# Patient Record
Sex: Female | Born: 1955 | Race: Black or African American | Hispanic: No | Marital: Single | Smoking: Never smoker
Health system: Southern US, Community
[De-identification: ages and names within clinical notes are randomized; demographics above are authoritative.]

## PROBLEM LIST (undated history)

## (undated) DIAGNOSIS — E079 Disorder of thyroid, unspecified: Secondary | ICD-10-CM

## (undated) DIAGNOSIS — R32 Unspecified urinary incontinence: Secondary | ICD-10-CM

## (undated) DIAGNOSIS — I10 Essential (primary) hypertension: Secondary | ICD-10-CM

## (undated) DIAGNOSIS — H409 Unspecified glaucoma: Secondary | ICD-10-CM

## (undated) DIAGNOSIS — M1990 Unspecified osteoarthritis, unspecified site: Secondary | ICD-10-CM

## (undated) DIAGNOSIS — F32A Depression, unspecified: Secondary | ICD-10-CM

## (undated) DIAGNOSIS — T7840XA Allergy, unspecified, initial encounter: Secondary | ICD-10-CM

## (undated) DIAGNOSIS — G4730 Sleep apnea, unspecified: Secondary | ICD-10-CM

## (undated) DIAGNOSIS — E039 Hypothyroidism, unspecified: Secondary | ICD-10-CM

## (undated) DIAGNOSIS — G4733 Obstructive sleep apnea (adult) (pediatric): Secondary | ICD-10-CM

## (undated) DIAGNOSIS — H269 Unspecified cataract: Secondary | ICD-10-CM

## (undated) DIAGNOSIS — F329 Major depressive disorder, single episode, unspecified: Secondary | ICD-10-CM

## (undated) DIAGNOSIS — K219 Gastro-esophageal reflux disease without esophagitis: Secondary | ICD-10-CM

## (undated) HISTORY — PX: REDUCTION MAMMAPLASTY: SUR839

## (undated) HISTORY — DX: Unspecified cataract: H26.9

## (undated) HISTORY — DX: Morbid (severe) obesity due to excess calories: E66.01

## (undated) HISTORY — PX: OTHER SURGICAL HISTORY: SHX169

## (undated) HISTORY — PX: BREAST REDUCTION SURGERY: SHX8

## (undated) HISTORY — DX: Obstructive sleep apnea (adult) (pediatric): G47.33

## (undated) HISTORY — DX: Depression, unspecified: F32.A

## (undated) HISTORY — DX: Sleep apnea, unspecified: G47.30

## (undated) HISTORY — DX: Gastro-esophageal reflux disease without esophagitis: K21.9

## (undated) HISTORY — DX: Disorder of thyroid, unspecified: E07.9

## (undated) HISTORY — DX: Major depressive disorder, single episode, unspecified: F32.9

## (undated) HISTORY — DX: Allergy, unspecified, initial encounter: T78.40XA

## (undated) HISTORY — PX: CATARACT EXTRACTION: SUR2

## (undated) HISTORY — PX: COLONOSCOPY: SHX174

## (undated) HISTORY — DX: Essential (primary) hypertension: I10

## (undated) HISTORY — DX: Unspecified glaucoma: H40.9

---

## 1997-11-22 ENCOUNTER — Emergency Department (HOSPITAL_COMMUNITY): Admission: EM | Admit: 1997-11-22 | Discharge: 1997-11-22 | Payer: Self-pay | Admitting: Emergency Medicine

## 1999-03-04 ENCOUNTER — Encounter: Admission: RE | Admit: 1999-03-04 | Discharge: 1999-06-02 | Payer: Self-pay | Admitting: Family Medicine

## 1999-03-30 ENCOUNTER — Encounter: Payer: Self-pay | Admitting: Emergency Medicine

## 1999-03-30 ENCOUNTER — Emergency Department (HOSPITAL_COMMUNITY): Admission: EM | Admit: 1999-03-30 | Discharge: 1999-03-30 | Payer: Self-pay | Admitting: Emergency Medicine

## 1999-05-26 ENCOUNTER — Encounter: Payer: Self-pay | Admitting: Emergency Medicine

## 1999-07-31 ENCOUNTER — Encounter: Admission: RE | Admit: 1999-07-31 | Discharge: 1999-10-29 | Payer: Self-pay | Admitting: Family Medicine

## 1999-08-19 ENCOUNTER — Encounter: Admission: RE | Admit: 1999-08-19 | Discharge: 1999-08-19 | Payer: Self-pay | Admitting: Family Medicine

## 1999-08-19 ENCOUNTER — Encounter: Payer: Self-pay | Admitting: Family Medicine

## 2000-01-01 ENCOUNTER — Encounter: Admission: RE | Admit: 2000-01-01 | Discharge: 2000-03-31 | Payer: Self-pay | Admitting: Family Medicine

## 2000-02-02 ENCOUNTER — Ambulatory Visit (HOSPITAL_COMMUNITY): Admission: RE | Admit: 2000-02-02 | Discharge: 2000-02-02 | Payer: Self-pay | Admitting: Internal Medicine

## 2000-02-02 ENCOUNTER — Encounter: Payer: Self-pay | Admitting: Internal Medicine

## 2000-02-09 ENCOUNTER — Other Ambulatory Visit: Admission: RE | Admit: 2000-02-09 | Discharge: 2000-02-09 | Payer: Self-pay | Admitting: Gynecology

## 2000-02-16 ENCOUNTER — Encounter: Admission: RE | Admit: 2000-02-16 | Discharge: 2000-02-16 | Payer: Self-pay | Admitting: Gynecology

## 2000-02-16 ENCOUNTER — Encounter: Payer: Self-pay | Admitting: Gynecology

## 2001-02-14 ENCOUNTER — Other Ambulatory Visit: Admission: RE | Admit: 2001-02-14 | Discharge: 2001-02-14 | Payer: Self-pay | Admitting: Gynecology

## 2001-02-17 ENCOUNTER — Encounter: Admission: RE | Admit: 2001-02-17 | Discharge: 2001-02-17 | Payer: Self-pay | Admitting: Gynecology

## 2001-02-17 ENCOUNTER — Encounter: Payer: Self-pay | Admitting: Gynecology

## 2001-05-01 ENCOUNTER — Ambulatory Visit (HOSPITAL_BASED_OUTPATIENT_CLINIC_OR_DEPARTMENT_OTHER): Admission: RE | Admit: 2001-05-01 | Discharge: 2001-05-01 | Payer: Self-pay | Admitting: Critical Care Medicine

## 2001-08-21 ENCOUNTER — Ambulatory Visit (HOSPITAL_BASED_OUTPATIENT_CLINIC_OR_DEPARTMENT_OTHER): Admission: RE | Admit: 2001-08-21 | Discharge: 2001-08-21 | Payer: Self-pay | Admitting: Critical Care Medicine

## 2002-02-15 ENCOUNTER — Other Ambulatory Visit: Admission: RE | Admit: 2002-02-15 | Discharge: 2002-02-15 | Payer: Self-pay | Admitting: Gynecology

## 2002-02-24 ENCOUNTER — Encounter: Payer: Self-pay | Admitting: Gynecology

## 2002-02-24 ENCOUNTER — Encounter: Admission: RE | Admit: 2002-02-24 | Discharge: 2002-02-24 | Payer: Self-pay | Admitting: Gynecology

## 2002-09-19 ENCOUNTER — Encounter: Payer: Self-pay | Admitting: Endocrinology

## 2002-09-19 ENCOUNTER — Ambulatory Visit (HOSPITAL_COMMUNITY): Admission: RE | Admit: 2002-09-19 | Discharge: 2002-09-19 | Payer: Self-pay | Admitting: Endocrinology

## 2002-09-29 ENCOUNTER — Ambulatory Visit (HOSPITAL_COMMUNITY): Admission: RE | Admit: 2002-09-29 | Discharge: 2002-09-29 | Payer: Self-pay | Admitting: Endocrinology

## 2002-09-29 ENCOUNTER — Encounter: Payer: Self-pay | Admitting: Endocrinology

## 2003-02-20 ENCOUNTER — Other Ambulatory Visit: Admission: RE | Admit: 2003-02-20 | Discharge: 2003-02-20 | Payer: Self-pay | Admitting: Gynecology

## 2003-03-14 ENCOUNTER — Encounter: Admission: RE | Admit: 2003-03-14 | Discharge: 2003-03-14 | Payer: Self-pay | Admitting: Gynecology

## 2004-02-25 ENCOUNTER — Other Ambulatory Visit: Admission: RE | Admit: 2004-02-25 | Discharge: 2004-02-25 | Payer: Self-pay | Admitting: Gynecology

## 2004-03-13 ENCOUNTER — Encounter: Admission: RE | Admit: 2004-03-13 | Discharge: 2004-03-13 | Payer: Self-pay | Admitting: Internal Medicine

## 2004-03-25 ENCOUNTER — Ambulatory Visit: Payer: Self-pay | Admitting: Internal Medicine

## 2004-05-21 ENCOUNTER — Ambulatory Visit: Payer: Self-pay | Admitting: Internal Medicine

## 2004-06-03 ENCOUNTER — Ambulatory Visit: Payer: Self-pay | Admitting: Internal Medicine

## 2004-06-06 ENCOUNTER — Ambulatory Visit: Payer: Self-pay | Admitting: Internal Medicine

## 2004-08-25 ENCOUNTER — Ambulatory Visit: Payer: Self-pay | Admitting: Internal Medicine

## 2004-08-28 ENCOUNTER — Ambulatory Visit: Payer: Self-pay | Admitting: Internal Medicine

## 2004-08-28 ENCOUNTER — Encounter: Admission: RE | Admit: 2004-08-28 | Discharge: 2004-08-28 | Payer: Self-pay | Admitting: Internal Medicine

## 2004-09-05 ENCOUNTER — Ambulatory Visit (HOSPITAL_COMMUNITY): Admission: RE | Admit: 2004-09-05 | Discharge: 2004-09-05 | Payer: Self-pay | Admitting: Internal Medicine

## 2004-09-09 ENCOUNTER — Ambulatory Visit: Payer: Self-pay | Admitting: Internal Medicine

## 2004-12-03 ENCOUNTER — Ambulatory Visit: Payer: Self-pay | Admitting: Internal Medicine

## 2005-02-06 ENCOUNTER — Ambulatory Visit: Payer: Self-pay | Admitting: Internal Medicine

## 2005-03-02 ENCOUNTER — Other Ambulatory Visit: Admission: RE | Admit: 2005-03-02 | Discharge: 2005-03-02 | Payer: Self-pay | Admitting: Gynecology

## 2005-03-16 ENCOUNTER — Ambulatory Visit (HOSPITAL_COMMUNITY): Admission: RE | Admit: 2005-03-16 | Discharge: 2005-03-16 | Payer: Self-pay | Admitting: Internal Medicine

## 2005-04-20 ENCOUNTER — Ambulatory Visit: Payer: Self-pay | Admitting: Critical Care Medicine

## 2005-05-04 ENCOUNTER — Ambulatory Visit: Payer: Self-pay | Admitting: Internal Medicine

## 2005-06-19 ENCOUNTER — Ambulatory Visit: Payer: Self-pay | Admitting: Gastroenterology

## 2005-07-03 ENCOUNTER — Ambulatory Visit: Payer: Self-pay | Admitting: Gastroenterology

## 2005-08-07 ENCOUNTER — Ambulatory Visit: Payer: Self-pay | Admitting: Internal Medicine

## 2005-08-18 ENCOUNTER — Ambulatory Visit: Payer: Self-pay | Admitting: Internal Medicine

## 2005-08-20 ENCOUNTER — Ambulatory Visit: Payer: Self-pay | Admitting: Cardiology

## 2005-09-23 ENCOUNTER — Ambulatory Visit: Payer: Self-pay | Admitting: Internal Medicine

## 2005-10-19 ENCOUNTER — Ambulatory Visit: Payer: Self-pay | Admitting: Internal Medicine

## 2005-11-09 ENCOUNTER — Ambulatory Visit: Payer: Self-pay | Admitting: Internal Medicine

## 2006-02-01 ENCOUNTER — Encounter (INDEPENDENT_AMBULATORY_CARE_PROVIDER_SITE_OTHER): Payer: Self-pay | Admitting: *Deleted

## 2006-02-02 ENCOUNTER — Inpatient Hospital Stay (HOSPITAL_COMMUNITY): Admission: RE | Admit: 2006-02-02 | Discharge: 2006-02-03 | Payer: Self-pay | Admitting: Gynecology

## 2006-02-24 ENCOUNTER — Ambulatory Visit: Payer: Self-pay | Admitting: Critical Care Medicine

## 2006-03-17 ENCOUNTER — Encounter: Admission: RE | Admit: 2006-03-17 | Discharge: 2006-03-17 | Payer: Self-pay | Admitting: Gynecology

## 2006-05-03 ENCOUNTER — Ambulatory Visit: Payer: Self-pay | Admitting: Internal Medicine

## 2006-05-03 ENCOUNTER — Other Ambulatory Visit: Admission: RE | Admit: 2006-05-03 | Discharge: 2006-05-03 | Payer: Self-pay | Admitting: Gynecology

## 2006-08-24 ENCOUNTER — Ambulatory Visit: Payer: Self-pay | Admitting: Internal Medicine

## 2007-04-04 ENCOUNTER — Encounter: Admission: RE | Admit: 2007-04-04 | Discharge: 2007-04-04 | Payer: Self-pay | Admitting: Internal Medicine

## 2007-05-02 DIAGNOSIS — D250 Submucous leiomyoma of uterus: Secondary | ICD-10-CM | POA: Insufficient documentation

## 2007-05-02 DIAGNOSIS — E042 Nontoxic multinodular goiter: Secondary | ICD-10-CM | POA: Insufficient documentation

## 2007-05-02 DIAGNOSIS — R1310 Dysphagia, unspecified: Secondary | ICD-10-CM | POA: Insufficient documentation

## 2007-05-03 ENCOUNTER — Ambulatory Visit: Payer: Self-pay | Admitting: Critical Care Medicine

## 2007-05-03 ENCOUNTER — Ambulatory Visit: Payer: Self-pay | Admitting: Internal Medicine

## 2007-05-03 DIAGNOSIS — F32A Depression, unspecified: Secondary | ICD-10-CM | POA: Insufficient documentation

## 2007-05-03 DIAGNOSIS — F329 Major depressive disorder, single episode, unspecified: Secondary | ICD-10-CM

## 2007-05-03 DIAGNOSIS — K219 Gastro-esophageal reflux disease without esophagitis: Secondary | ICD-10-CM | POA: Insufficient documentation

## 2007-05-03 DIAGNOSIS — G4733 Obstructive sleep apnea (adult) (pediatric): Secondary | ICD-10-CM | POA: Insufficient documentation

## 2007-05-03 DIAGNOSIS — I10 Essential (primary) hypertension: Secondary | ICD-10-CM | POA: Insufficient documentation

## 2007-05-03 DIAGNOSIS — Z9989 Dependence on other enabling machines and devices: Secondary | ICD-10-CM

## 2007-05-03 DIAGNOSIS — J309 Allergic rhinitis, unspecified: Secondary | ICD-10-CM | POA: Insufficient documentation

## 2007-05-03 LAB — CONVERTED CEMR LAB
ALT: 20 units/L (ref 0–35)
AST: 18 units/L (ref 0–37)
Albumin: 3.7 g/dL (ref 3.5–5.2)
Alkaline Phosphatase: 76 units/L (ref 39–117)
BUN: 4 mg/dL — ABNORMAL LOW (ref 6–23)
Basophils Absolute: 0 10*3/uL (ref 0.0–0.1)
Basophils Relative: 0.3 % (ref 0.0–1.0)
Bilirubin Urine: NEGATIVE
Bilirubin, Direct: 0.2 mg/dL (ref 0.0–0.3)
CO2: 30 meq/L (ref 19–32)
Calcium: 9.4 mg/dL (ref 8.4–10.5)
Chloride: 105 meq/L (ref 96–112)
Cholesterol: 191 mg/dL (ref 0–200)
Creatinine, Ser: 0.8 mg/dL (ref 0.4–1.2)
Crystals: NEGATIVE
Eosinophils Absolute: 0.3 10*3/uL (ref 0.0–0.6)
Eosinophils Relative: 4.4 % (ref 0.0–5.0)
GFR calc Af Amer: 97 mL/min
GFR calc non Af Amer: 80 mL/min
Glucose, Bld: 99 mg/dL (ref 70–99)
HCT: 35.3 % — ABNORMAL LOW (ref 36.0–46.0)
HDL: 37.6 mg/dL — ABNORMAL LOW (ref >39.0)
Hemoglobin: 11.9 g/dL — ABNORMAL LOW (ref 12.0–15.0)
Ketones, ur: NEGATIVE mg/dL
LDL Cholesterol: 132 mg/dL — ABNORMAL HIGH (ref 0–99)
Leukocytes, UA: NEGATIVE
Lymphocytes Relative: 32.6 % (ref 12.0–46.0)
MCHC: 33.7 g/dL (ref 30.0–36.0)
MCV: 91.8 fL (ref 78.0–100.0)
Monocytes Absolute: 0.4 10*3/uL (ref 0.2–0.7)
Monocytes Relative: 7 % (ref 3.0–11.0)
Mucus, UA: NEGATIVE
Neutro Abs: 3.1 10*3/uL (ref 1.4–7.7)
Neutrophils Relative %: 55.7 % (ref 43.0–77.0)
Nitrite: NEGATIVE
Platelets: 271 10*3/uL (ref 150–400)
Potassium: 3.8 meq/L (ref 3.5–5.1)
RBC: 3.85 M/uL — ABNORMAL LOW (ref 3.87–5.11)
RDW: 12.7 % (ref 11.5–14.6)
Sodium: 143 meq/L (ref 135–145)
Specific Gravity, Urine: >=1.03 (ref 1.000–1.03)
TSH: 0.15 microintl units/mL — ABNORMAL LOW (ref 0.35–5.50)
Total Bilirubin: 0.8 mg/dL (ref 0.3–1.2)
Total CHOL/HDL Ratio: 5.1
Total Protein, Urine: NEGATIVE mg/dL
Total Protein: 7 g/dL (ref 6.0–8.3)
Triglycerides: 106 mg/dL (ref 0–149)
Urine Glucose: NEGATIVE mg/dL
Urobilinogen, UA: 0.2 (ref 0.0–1.0)
VLDL: 21 mg/dL (ref 0–40)
WBC: 5.7 10*3/uL (ref 4.5–10.5)
pH: 5.5 (ref 5.0–8.0)

## 2007-05-10 ENCOUNTER — Ambulatory Visit: Payer: Self-pay | Admitting: Internal Medicine

## 2007-05-10 ENCOUNTER — Other Ambulatory Visit: Admission: RE | Admit: 2007-05-10 | Discharge: 2007-05-10 | Payer: Self-pay | Admitting: Gynecology

## 2007-05-10 DIAGNOSIS — E039 Hypothyroidism, unspecified: Secondary | ICD-10-CM | POA: Insufficient documentation

## 2007-09-07 ENCOUNTER — Ambulatory Visit: Payer: Self-pay | Admitting: Internal Medicine

## 2007-09-07 DIAGNOSIS — R21 Rash and other nonspecific skin eruption: Secondary | ICD-10-CM | POA: Insufficient documentation

## 2007-09-08 ENCOUNTER — Encounter: Payer: Self-pay | Admitting: Internal Medicine

## 2007-09-08 LAB — CONVERTED CEMR LAB
BUN: 9 mg/dL (ref 6–23)
CO2: 31 meq/L (ref 19–32)
Calcium: 9.5 mg/dL (ref 8.4–10.5)
Chloride: 110 meq/L (ref 96–112)
Creatinine, Ser: 0.9 mg/dL (ref 0.4–1.2)
GFR calc Af Amer: 85 mL/min
GFR calc non Af Amer: 70 mL/min
Glucose, Bld: 111 mg/dL — ABNORMAL HIGH (ref 70–99)
Potassium: 4 meq/L (ref 3.5–5.1)
Sodium: 145 meq/L (ref 135–145)
TSH: 0.26 microintl units/mL — ABNORMAL LOW (ref 0.35–5.50)

## 2007-09-12 ENCOUNTER — Ambulatory Visit: Payer: Self-pay | Admitting: Internal Medicine

## 2007-09-21 ENCOUNTER — Telehealth: Payer: Self-pay | Admitting: Internal Medicine

## 2007-09-23 ENCOUNTER — Ambulatory Visit: Payer: Self-pay | Admitting: Internal Medicine

## 2007-09-29 LAB — CONVERTED CEMR LAB
BUN: 5 mg/dL — ABNORMAL LOW (ref 6–23)
CO2: 31 meq/L (ref 19–32)
Calcium: 9.1 mg/dL (ref 8.4–10.5)
Chloride: 113 meq/L — ABNORMAL HIGH (ref 96–112)
Creatinine, Ser: 0.8 mg/dL (ref 0.4–1.2)
GFR calc Af Amer: 97 mL/min
GFR calc non Af Amer: 80 mL/min
Glucose, Bld: 92 mg/dL (ref 70–99)
Potassium: 4.3 meq/L (ref 3.5–5.1)
Sodium: 147 meq/L — ABNORMAL HIGH (ref 135–145)
TSH: 0.48 microintl units/mL (ref 0.35–5.50)

## 2007-10-20 ENCOUNTER — Telehealth: Payer: Self-pay | Admitting: Internal Medicine

## 2007-12-29 ENCOUNTER — Ambulatory Visit: Payer: Self-pay | Admitting: Internal Medicine

## 2008-02-29 ENCOUNTER — Telehealth: Payer: Self-pay | Admitting: Internal Medicine

## 2008-03-29 ENCOUNTER — Ambulatory Visit: Payer: Self-pay | Admitting: Critical Care Medicine

## 2008-04-04 ENCOUNTER — Ambulatory Visit (HOSPITAL_BASED_OUTPATIENT_CLINIC_OR_DEPARTMENT_OTHER): Admission: RE | Admit: 2008-04-04 | Discharge: 2008-04-04 | Payer: Self-pay | Admitting: Internal Medicine

## 2008-04-25 ENCOUNTER — Encounter: Payer: Self-pay | Admitting: Pulmonary Disease

## 2008-04-25 ENCOUNTER — Ambulatory Visit (HOSPITAL_BASED_OUTPATIENT_CLINIC_OR_DEPARTMENT_OTHER): Admission: RE | Admit: 2008-04-25 | Discharge: 2008-04-25 | Payer: Self-pay | Admitting: Critical Care Medicine

## 2008-04-26 ENCOUNTER — Ambulatory Visit: Payer: Self-pay | Admitting: Internal Medicine

## 2008-04-26 LAB — CONVERTED CEMR LAB
BUN: 9 mg/dL (ref 6–23)
CO2: 31 meq/L (ref 19–32)
Calcium: 9.8 mg/dL (ref 8.4–10.5)
Chloride: 106 meq/L (ref 96–112)
Creatinine, Ser: 0.8 mg/dL (ref 0.4–1.2)
GFR calc Af Amer: 96 mL/min
GFR calc non Af Amer: 80 mL/min
Glucose, Bld: 93 mg/dL (ref 70–99)
Potassium: 3.6 meq/L (ref 3.5–5.1)
Sodium: 144 meq/L (ref 135–145)
TSH: 4.12 microintl units/mL (ref 0.35–5.50)

## 2008-05-01 ENCOUNTER — Ambulatory Visit: Payer: Self-pay | Admitting: Internal Medicine

## 2008-05-02 ENCOUNTER — Ambulatory Visit: Payer: Self-pay | Admitting: Pulmonary Disease

## 2008-05-02 ENCOUNTER — Telehealth: Payer: Self-pay | Admitting: Internal Medicine

## 2008-05-06 ENCOUNTER — Encounter: Payer: Self-pay | Admitting: Critical Care Medicine

## 2008-05-06 ENCOUNTER — Telehealth: Payer: Self-pay | Admitting: Critical Care Medicine

## 2008-05-07 ENCOUNTER — Telehealth (INDEPENDENT_AMBULATORY_CARE_PROVIDER_SITE_OTHER): Payer: Self-pay | Admitting: *Deleted

## 2008-06-08 ENCOUNTER — Ambulatory Visit: Payer: Self-pay | Admitting: Critical Care Medicine

## 2008-06-25 ENCOUNTER — Encounter: Payer: Self-pay | Admitting: Critical Care Medicine

## 2008-06-27 ENCOUNTER — Ambulatory Visit: Payer: Self-pay | Admitting: Pulmonary Disease

## 2008-06-28 ENCOUNTER — Encounter: Payer: Self-pay | Admitting: Pulmonary Disease

## 2008-08-09 ENCOUNTER — Telehealth: Payer: Self-pay | Admitting: Pulmonary Disease

## 2008-08-13 ENCOUNTER — Telehealth: Payer: Self-pay | Admitting: Internal Medicine

## 2008-08-16 HISTORY — PX: ANKLE SURGERY: SHX546

## 2008-08-23 ENCOUNTER — Ambulatory Visit: Payer: Self-pay | Admitting: Internal Medicine

## 2008-08-23 LAB — CONVERTED CEMR LAB
BUN: 11 mg/dL (ref 6–23)
CO2: 28 meq/L (ref 19–32)
Calcium: 9.4 mg/dL (ref 8.4–10.5)
Chloride: 106 meq/L (ref 96–112)
Creatinine, Ser: 0.8 mg/dL (ref 0.4–1.2)
GFR calc non Af Amer: 96.26 mL/min (ref >60)
Glucose, Bld: 77 mg/dL (ref 70–99)
Potassium: 4.3 meq/L (ref 3.5–5.1)
Sodium: 145 meq/L (ref 135–145)
TSH: 4.91 microintl units/mL (ref 0.35–5.50)

## 2008-08-27 LAB — CONVERTED CEMR LAB: Vit D, 25-Hydroxy: 17 ng/mL — ABNORMAL LOW (ref 30–89)

## 2008-08-28 ENCOUNTER — Ambulatory Visit: Payer: Self-pay | Admitting: Internal Medicine

## 2008-08-28 DIAGNOSIS — M25569 Pain in unspecified knee: Secondary | ICD-10-CM | POA: Insufficient documentation

## 2008-08-28 DIAGNOSIS — E559 Vitamin D deficiency, unspecified: Secondary | ICD-10-CM | POA: Insufficient documentation

## 2008-11-22 ENCOUNTER — Ambulatory Visit: Payer: Self-pay | Admitting: Internal Medicine

## 2008-11-23 LAB — CONVERTED CEMR LAB
BUN: 12 mg/dL (ref 6–23)
CO2: 29 meq/L (ref 19–32)
Calcium: 9.3 mg/dL (ref 8.4–10.5)
Chloride: 107 meq/L (ref 96–112)
Creatinine, Ser: 0.8 mg/dL (ref 0.4–1.2)
GFR calc non Af Amer: 96.17 mL/min (ref >60)
Glucose, Bld: 88 mg/dL (ref 70–99)
Potassium: 3.5 meq/L (ref 3.5–5.1)
Sodium: 146 meq/L — ABNORMAL HIGH (ref 135–145)
TSH: 7.81 microintl units/mL — ABNORMAL HIGH (ref 0.35–5.50)

## 2008-11-26 ENCOUNTER — Ambulatory Visit: Payer: Self-pay | Admitting: Internal Medicine

## 2008-11-29 ENCOUNTER — Telehealth: Payer: Self-pay | Admitting: Internal Medicine

## 2009-02-22 ENCOUNTER — Ambulatory Visit: Payer: Self-pay | Admitting: Internal Medicine

## 2009-02-22 LAB — CONVERTED CEMR LAB
BUN: 11 mg/dL (ref 6–23)
CO2: 29 meq/L (ref 19–32)
Calcium: 9.7 mg/dL (ref 8.4–10.5)
Chloride: 103 meq/L (ref 96–112)
Creatinine, Ser: 0.9 mg/dL (ref 0.4–1.2)
GFR calc non Af Amer: 83.87 mL/min (ref >60)
Glucose, Bld: 89 mg/dL (ref 70–99)
Potassium: 3.8 meq/L (ref 3.5–5.1)
Sodium: 145 meq/L (ref 135–145)
TSH: 0.98 microintl units/mL (ref 0.35–5.50)

## 2009-03-01 ENCOUNTER — Ambulatory Visit: Payer: Self-pay | Admitting: Internal Medicine

## 2009-03-01 DIAGNOSIS — K439 Ventral hernia without obstruction or gangrene: Secondary | ICD-10-CM | POA: Insufficient documentation

## 2009-03-13 ENCOUNTER — Telehealth: Payer: Self-pay | Admitting: Internal Medicine

## 2009-03-28 ENCOUNTER — Encounter: Payer: Self-pay | Admitting: Internal Medicine

## 2009-04-05 ENCOUNTER — Ambulatory Visit: Payer: Self-pay | Admitting: Diagnostic Radiology

## 2009-04-05 ENCOUNTER — Ambulatory Visit (HOSPITAL_BASED_OUTPATIENT_CLINIC_OR_DEPARTMENT_OTHER): Admission: RE | Admit: 2009-04-05 | Discharge: 2009-04-05 | Payer: Self-pay | Admitting: Gynecology

## 2009-04-24 ENCOUNTER — Ambulatory Visit: Payer: Self-pay | Admitting: Pulmonary Disease

## 2009-05-03 ENCOUNTER — Ambulatory Visit: Payer: Self-pay | Admitting: Internal Medicine

## 2009-05-15 ENCOUNTER — Encounter: Payer: Self-pay | Admitting: Internal Medicine

## 2009-07-15 ENCOUNTER — Telehealth (INDEPENDENT_AMBULATORY_CARE_PROVIDER_SITE_OTHER): Payer: Self-pay | Admitting: *Deleted

## 2009-07-16 ENCOUNTER — Telehealth: Payer: Self-pay | Admitting: Internal Medicine

## 2009-07-16 ENCOUNTER — Encounter: Payer: Self-pay | Admitting: Internal Medicine

## 2009-07-22 ENCOUNTER — Encounter: Payer: Self-pay | Admitting: Internal Medicine

## 2010-01-03 ENCOUNTER — Encounter: Payer: Self-pay | Admitting: Pulmonary Disease

## 2010-04-24 ENCOUNTER — Ambulatory Visit: Payer: Self-pay | Admitting: Internal Medicine

## 2010-04-24 LAB — CONVERTED CEMR LAB: Blood Glucose, Fingerstick: 97

## 2010-04-29 LAB — CONVERTED CEMR LAB
Bilirubin Urine: NEGATIVE
Ketones, ur: NEGATIVE mg/dL
Leukocytes, UA: NEGATIVE
Nitrite: NEGATIVE
Specific Gravity, Urine: >=1.03 (ref 1.000–1.030)
Total Protein, Urine: NEGATIVE mg/dL
Urine Glucose: NEGATIVE mg/dL
Urobilinogen, UA: 0.2 (ref 0.0–1.0)
pH: 5 (ref 5.0–8.0)

## 2010-05-16 ENCOUNTER — Ambulatory Visit (HOSPITAL_COMMUNITY)
Admission: RE | Admit: 2010-05-16 | Discharge: 2010-05-16 | Payer: Self-pay | Source: Home / Self Care | Attending: Gynecology | Admitting: Gynecology

## 2010-05-16 LAB — HM MAMMOGRAPHY: HM Mammogram: NEGATIVE

## 2010-06-17 NOTE — Assessment & Plan Note (Signed)
Summary: 3 MO ROV /NWS  $50   Vital Signs:  Patient profile:   55 year old female Weight:      305 pounds Temp:     97.3 degrees F oral Pulse rate:   61 / minute BP sitting:   164 / 102  (left arm)  Vitals Entered By: Tora Perches (March 01, 2009 8:08 AM) CC: f/u Is Patient Diabetic? No   Primary Care Provider:  Dr Posey Rea  CC:  f/u.  History of Present Illness: The patient presents for a follow up of hypertension, wt gain, hyperlipidemia   Preventive Screening-Counseling & Management  Alcohol-Tobacco     Smoking Status: never  Current Medications (verified): 1)  Doxazosin Mesylate 4 Mg  Tabs (Doxazosin Mesylate) .[name].. 1 By Mouth Two Times A Day 2)  Atenolol 100 Mg  Tabs (Atenolol) .[name].. 1 By Mouth Two Times A Day 3)  Furosemide 40 Mg  Tabs (Furosemide) .... Once Daily 4)  Klor-Con M20 20 Meq  Tbcr (Potassium Chloride Crys Cr) .... Once Daily 5)  Nasonex 50 Mcg/act  Susp (Mometasone Furoate) .... Two Puffs Each Nostril Daily 6)  Sertraline Hcl 100 Mg Tabs (Sertraline Hcl) .[name].. 1 By Mouth Once Daily 7)  Vitamin D3 1000 Unit  Tabs (Cholecalciferol) .[name].. 1 Qd 8)  Adult Aspirin Ec Low Strength 81 Mg  Tbec (Aspirin) .[name].. 1 By Mouth Daily 9)  B Complex-B12   Tabs (B Complex Vitamins) .[name].. 1 By Mouth Daily 10)  Fexofenadine Hcl 180 Mg Tabs (Fexofenadine Hcl) .[name].. 1 Once Daily As Needed Allergies 11)  Voltaren 1 %  Gel (Diclofenac Sodium) .... Two Times A Day  To Qid As Needed 12)  Furosemide 20 Mg Tabs (Furosemide) .... Take 1 Tab By Mouth Every Day 13)  Levothyroxine Sodium 175 Mcg  Tabs (Levothyroxine Sodium) .[name].. 1 By Mouth Daily 14)  Cpap .Marland KitchenMarland KitchenMarland Kitchen 15  Cmh2o  Allergies: 1)  ! Diovan (Valsartan) 2)  ! Lotrel (Amlodipine Besy-Benazepril Hcl)  Past History:  Past Medical History: Last updated: 05/10/2007 Sleep Apnea Depression G E R D Hypertension Allergic Rhinitis OSA on CPAP   Dr Delford Field   GYN Dr Nicholas Lose  Social History: Last updated: 05/01/2008 Patient never  smoked.  Occupation: lost her job in [employer] w/AE Single  Physical Exam  General:   in no acute distress obese.   Head:  normocephalic and atraumatic Nose:  no deformity, discharge, inflammation, or lesions Mouth:  no deformity or lesions    Neck:  no masses, thyromegaly, or abnormal cervical nodes Lungs:  clear bilaterally to auscultation and percussion Heart:  regular rate and rhythm, S1, S2 without murmurs, rubs, gallops, or clicks Abdomen:  S/NT Ventr hernia to the R and up from umbilicus Msk:  R ankle  Extremities:  No edema B Neurologic:  CN II-XII grossly intact with normal reflexes, coordination, muscle strength and tone Skin:  intact without lesions or rashes Psych:  alert and cooperative; normal mood and affect; normal attention span and concentration   Impression & Recommendations:  Problem # 1:  OBESITY, MORBID (ICD-278.01) Assessment Deteriorated See "Patient Instructions".   Problem # 2:  HYPOTHYROIDISM, UNSPECIFIED (ICD-244.9) Assessment: Improved  Her updated medication list for this problem includes:    Levothyroxine Sodium 175 Mcg Tabs (Levothyroxine sodium) .[name]... 1 by mouth daily  Problem # 3:  HYPERTENSION (ICD-401.9) Assessment: Deteriorated Loose wt. BP nl at home. The following medications were removed from the medication list:    Furosemide 20 Mg Tabs (Furosemide) .Marland KitchenMarland KitchenMarland KitchenMarland Kitchen  Take 1 tab by mouth every day Her updated medication list for this problem includes:    Doxazosin Mesylate 4 Mg Tabs (Doxazosin mesylate) .[name]... 1 by mouth two times a day    Atenolol 100 Mg Tabs (Atenolol) .[name]... 1 by mouth two times a day    Furosemide 40 Mg Tabs (Furosemide) ..... Once daily  Problem # 4:  DEPRESSION (ICD-311) Assessment: Unchanged  Her updated medication list for this problem includes:    Sertraline Hcl 100 Mg Tabs (Sertraline hcl) .[name]... 1 by mouth once daily  Problem # 5:  HERNIA, VENTRAL (ICD-553.20) Assessment: New Surg ref offered  Complete  Medication List: 1)  Doxazosin Mesylate 4 Mg Tabs (Doxazosin mesylate) .[name].. 1 by mouth two times a day 2)  Atenolol 100 Mg Tabs (Atenolol) .[name].. 1 by mouth two times a day 3)  Furosemide 40 Mg Tabs (Furosemide) .... Once daily 4)  Klor-con M20 20 Meq Tbcr (Potassium chloride crys cr) .... Once daily 5)  Nasonex 50 Mcg/act Susp (Mometasone furoate) .... Two puffs each nostril daily 6)  Sertraline Hcl 100 Mg Tabs (Sertraline hcl) .[name].. 1 by mouth once daily 7)  Vitamin D3 1000 Unit Tabs (Cholecalciferol) .[name].. 1 qd 8)  Adult Aspirin Ec Low Strength 81 Mg Tbec (Aspirin) .[name].. 1 by mouth daily 9)  B Complex-b12 Tabs (B complex vitamins) .[name].. 1 by mouth daily 10)  Fexofenadine Hcl 180 Mg Tabs (Fexofenadine hcl) .[name].. 1 once daily as needed allergies 11)  Voltaren 1 % Gel (Diclofenac sodium) .... Two times a day  to qid as needed 12)  Levothyroxine Sodium 175 Mcg Tabs (Levothyroxine sodium) .[name].. 1 by mouth daily 13)  Cpap  .Marland KitchenMarland KitchenMarland Kitchen 15  cmh2o  Patient Instructions: 1)  Use stretching exercises that I have provided (15 min. or longer every day) 2)  Try to eat more raw plant food, fresh and dry fruit, raw almonds, leafy vegetables, whole foods and less red meat, less animal fat. Poultry and fish is better for you than pork and beef. Avoid processed foods (canned soups, hot dogs, sausage, bacon , frozen dinners). Avoid corn syrup, high fructose syrup or aspartam and Splenda  containing drinks. Honey, Agave and Stevia are better sweeteners. Make your own  dressing with olive oil, wine vinegar, lemon juce, garlic etc. for your salads. 3)  Please schedule a follow-up appointment in 4 months. 4)  BMP prior to visit, ICD-9:272.0  995.20 5)  Hepatic Panel prior to visit, ICD-9: 6)  Lipid Panel prior to visit, ICD-9: 7)  TSH prior to visit, ICD-9: Prescriptions: LEVOTHYROXINE SODIUM 175 MCG  TABS (LEVOTHYROXINE SODIUM) 1 by mouth daily  #90 x 3   Entered and Authorized by:   Tresa Garter MD   Signed by:    Tresa Garter MD on 03/01/2009   Method used:   Electronically to        [facility]* (mail-order)             ,          Ph: [phone]       Fax: [phone]   RxID:   [id number] FEXOFENADINE HCL 180 MG TABS (FEXOFENADINE HCL) 1 once daily as needed allergies  #90 x 3   Entered and Authorized by:   Tresa Garter MD   Signed by:   Tresa Garter MD on 03/01/2009   Method used:   Electronically to        [facility]* (mail-order)             ,  Ph: [phone]       Fax: [phone]   RxID:   [id number] VITAMIN D3 1000 UNIT  TABS (CHOLECALCIFEROL) 1 qd  #90 x 3   Entered and Authorized by:   Tresa Garter MD   Signed by:   Tresa Garter MD on 03/01/2009   Method used:   Electronically to        [facility]* (mail-order)             ,          Ph: [phone]       Fax: [phone]   RxID:   [id number] SERTRALINE HCL 100 MG TABS (SERTRALINE HCL) 1 by mouth once daily  #90 x 3   Entered and Authorized by:   Tresa Garter MD   Signed by:   Tresa Garter MD on 03/01/2009   Method used:   Electronically to        [facility]* (mail-order)             ,          Ph: [phone]       Fax: [phone]   RxID:   [id number] KLOR-CON M20 20 MEQ  TBCR (POTASSIUM CHLORIDE CRYS CR) once daily  #90 x 3   Entered and Authorized by:   Tresa Garter MD   Signed by:   Tresa Garter MD on 03/01/2009   Method used:   Electronically to        [facility]* (mail-order)             ,          Ph: [phone]       Fax: [phone]   RxID:   [id number] FUROSEMIDE 40 MG  TABS (FUROSEMIDE) once daily  #90 x 3   Entered and Authorized by:   Tresa Garter MD   Signed by:   Tresa Garter MD on 03/01/2009   Method used:   Electronically to        [facility]* (mail-order)             ,          Ph: [phone]       Fax: [phone]   RxID:   [id number] ATENOLOL  100 MG  TABS (ATENOLOL) 1 by mouth two times a day  #180 x 3   Entered and Authorized by:   Tresa Garter MD   Signed by:   Tresa Garter MD on 03/01/2009   Method used:   Electronically to        [facility]* (mail-order)             ,          Ph: [phone]       Fax: [phone]   RxID:   [id number] DOXAZOSIN MESYLATE 4 MG  TABS (DOXAZOSIN MESYLATE) 1 by mouth two times a day  #180 x 3   Entered and Authorized by:   Tresa Garter MD   Signed by:   Tresa Garter MD on 03/01/2009   Method used:   Electronically to        [facility]* (mail-order)             ,          Ph: [phone]       Fax: [phone]   RxID:   [id number]

## 2010-06-17 NOTE — Letter (Signed)
Summary: Generic Letter  [facility]  [address] [place], [place] [address]   Phone: [phone]  Fax: [phone]    07/16/2009  [facility] [address] [place], [place]  [address]  Dear Ms. Nazar,   The above named patient was advised to remove the carpet from her home due to medical reasons.       Sincerely,   Lamar Sprinkles, CMA (AAMA) For Dr A. Plotnikov

## 2010-06-17 NOTE — Miscellaneous (Signed)
Summary: Orders Update  Clinical Lists Changes  Orders: Added new Referral order of DME Referral (DME) - Signed 

## 2010-06-17 NOTE — Progress Notes (Signed)
Summary: CPAP adjustment- pt returned call  ---- Converted from flag ---- ---- 05/06/2008 2:53 PM, Storm Frisk MD wrote: call the pt and tell her we will be adjusting cpap to 15cmh2o pressure. may have a leak on mask  will follow. pt needs another f/u ov soon after pressure is adjusted.  pw ------------------------------ LMTCB.  lc Phone Note Call from Patient   Caller: Patient Call For: wright Summary of Call: pt returned call. she has made an ov for 1/ 22 in hp office. she will rsc if needed after speaking to nurse re: cpap pressure.  Initial call taken by: Tivis Ringer,  May 08, 2008 12:05 PM  Follow-up for Phone Call        Noted. I did speak with pt to confirm f/u ov with PW.  She is to call if she has any problems with the new mask or on the new CPAP pressure. Follow-up by: Michel Bickers CMA,  May 08, 2008 5:15 PM

## 2010-06-17 NOTE — Assessment & Plan Note (Signed)
Summary: SLEEP CONSULT/RJC   Referred by:  Dr Shan Levans PCP:  Dr Posey Rea  Chief Complaint:  Sleep consult.  Pt has CPAP machine uses every night.  Pt got new CPAP x 2 weeks ago, doing better on new machine.  Last sleep study 04/25/08., and waking up gasping for air.  History of Present Illness: 55/F, obese with obstructive sleep apnea diagnosed in 4/03 on CPAP 16 cm. Weight gain of 58 lbs since study, rpt titration 12/09 showed adequate control of events with CPAP 15 cm, now on autoCPAP - awaiting download. Breakthrough gasping events have decreased-she describes one such event in church when awake. Feels presure is high sometimes when she has an arousal. Bedtime is 10 p, sleep latency 5-15 mins, wakes up 2-3 times without post void latency , oob at 8A, feels refreshed. Denies excessive daytime somnolence , Epworth Sleepiness Score 5/24.      This is a 55 year old woman who presents with waking up gasping for air.  The patient has no history of problem falling asleep, problem staying asleep, frequent night time awakenings, prolonged middle of the night awakening, early morning awakenings, excessive daytime sleepiness, frequent episodes of sudden loss of muscle tone, loud snoring, witnessed apneas, difficulty waking, non restorative sleep, shortness of breath, nighttime chest pain, waking up gasping for air, nighttime cough, sleep walking, night terrors, eating during sleep, restless legs syndrome, frequent leg jerks during sleep, acting out dreams, and recurrent nightmares.  There is no history of racing thoughts, going to bed not sleepy, variable wake times, feeling wired but tired, increased time in bed, watching TV in bed, reading in bed, working in bed, working up to bedtime, supervising sleep, daytime anxiety, nightime anxiety, sleep anxiety, depression, and chronic pain.  There is no history of behavioral problems, inattention, seizure disorder, unable to fall asleep alone, associations:, and  sleep walking:.  Prior steps taken have included CPAP.  Evaluation to date has included sleep study.      Updated Prior Medication List: DOXAZOSIN MESYLATE 4 MG  TABS (DOXAZOSIN MESYLATE) 1 by mouth two times a day ATENOLOL 100 MG  TABS (ATENOLOL) 1 by mouth two times a day FUROSEMIDE 40 MG  TABS (FUROSEMIDE) once daily KLOR-CON M20 20 MEQ  TBCR (POTASSIUM CHLORIDE CRYS CR) once daily LEVOXYL 137 MCG  TABS (LEVOTHYROXINE SODIUM) 1 by mouth qd NASONEX 50 MCG/ACT  SUSP (MOMETASONE FUROATE) Two puffs each nostril daily [BMN] SERTRALINE HCL 100 MG TABS (SERTRALINE HCL) 1 by mouth once daily VITAMIN D3 1000 UNIT  TABS (CHOLECALCIFEROL) 1 qd ADULT ASPIRIN EC LOW STRENGTH 81 MG  TBEC (ASPIRIN) 1 by mouth daily B COMPLEX-B12   TABS (B COMPLEX VITAMINS) 1 by mouth daily FEXOFENADINE HCL 180 MG TABS (FEXOFENADINE HCL) 1 once daily as needed allergies * CPAP 15  cmH2O  Current Allergies (reviewed today): ! DIOVAN (VALSARTAN) ! LOTREL (AMLODIPINE BESY-BENAZEPRIL HCL) Past Medical History:    Reviewed history from 05/10/2007 and no changes required:       Sleep Apnea       Depression       G E R D       Hypertension       Allergic Rhinitis       OSA on CPAP   Dr Delford Field                     GYN Dr Nicholas Lose  Family History:    Family History Hypertension    mother-breast CA  Mat grandmother-colon CA  Social History:    Reviewed history from 05/01/2008 and no changes required:       Patient never smoked.        Occupation: lost her job in [employer] w/AE       Single   Risk Factors: Tobacco use:  never Alcohol use:  no  Mammogram History:    Date of Last Mammogram:  04/04/2008  @  [facility]  Vital Signs:  Patient Profile:   55 Years Old Female Height:     66 inches Weight:      307 pounds O2 Sat:      96 % O2 treatment:    Room Air Temp:     97.8 degrees F oral Pulse rate:   63 / minute BP sitting:   138 / 80  (right arm) Cuff size:   large  Vitals Entered By: Cloyde Reams  RN (June 27, 2008 10:21 AM)             Comments Pt is here for sleep consult referred by Dr Delford Field. Medications reviewed Cloyde Reams RN  June 27, 2008 10:26 AM     Physical Exam  General:     well developed, well nourished, in no acute distress obese.   Head:     normocephalic and atraumatic Eyes:     PERRLA/EOM intact; conjunctiva and sclera clear Ears:     TMs intact and clear with normal canals Nose:     no deformity, discharge, inflammation, or lesions Mouth:     no deformity or lesions Melampatti Class II.   Neck:     no masses, thyromegaly, or abnormal cervical nodes Chest Wall:     no deformities noted Lungs:     clear bilaterally to auscultation and percussion Heart:     regular rate and rhythm, S1, S2 without murmurs, rubs, gallops, or clicks Abdomen:     bowel sounds positive; abdomen soft and non-tender without masses, or organomegaly Msk:     no deformity or scoliosis noted with normal posture Pulses:     pulses normal Extremities:     no clubbing, cyanosis, edema, or deformity noted Neurologic:     CN II-XII grossly intact with normal reflexes, coordination, muscle strength and tone Skin:     intact without lesions or rashes Cervical Nodes:     no significant adenopathy Axillary Nodes:     no significant adenopathy Psych:     alert and cooperative; normal mood and affect; normal attention span and concentration   Impression & Recommendations:  Problem # 1:  SLEEP APNEA (ICD-780.57) Assessment: Improved Improved with auto CPAP - await download beofre any more changes. Discussed ramp function, does not appear to have mask leak - tolerating nasal pillows well. The pathophysiology of obstructive sleep apnea, it's cardiovascular consequences and modes of treatment including CPAP were discussed with the patient in great detail.   Problem # 2:  OBESITY, MORBID (ICD-278.01) Has seen a nutritionist. To start exercise program in spring.   Other Orders: Est. Patient Level IV (40981)  Patient Instructions: 1)  Please schedule a follow-up appointment in 4 months. 2)  I will look at the [facility] & recommend changes if needed.  Appended Document: SLEEP CONSULT/RJC reviewed downlaod >> avg pressure 13 cm, study showed 15 cm, will leave on auto since symptomatically better.

## 2010-06-17 NOTE — Progress Notes (Signed)
Summary: ALLERGIES  Phone Note Call from Patient Call back at Home Phone [phone]   Summary of Call: Patient is requesting rx for allergies. Says that she thinks she was given allegra in the past.  Initial call taken by: Lamar Sprinkles,  October 20, 2007 1:07 PM  Follow-up for Phone Call        OK Allegra Follow-up by: Tresa Garter MD,  October 20, 2007 1:28 PM  Additional Follow-up for Phone Call Additional follow up Details #1::        Called patient [facility] med sent to pharm. Additional Follow-up by: Orlan Leavens,  October 20, 2007 2:16 PM    New/Updated Medications: FEXOFENADINE HCL 180 MG TABS (FEXOFENADINE HCL) 1 once daily as needed allergies   Prescriptions: FEXOFENADINE HCL 180 MG TABS (FEXOFENADINE HCL) 1 once daily as needed allergies  #30 x 6   Entered and Authorized by:   Tresa Garter MD   Signed by:   Orlan Leavens on 10/20/2007   Method used:   Electronically sent to ...       [facility] # [phone] [address]       [place], [place]  [address]       Ph: [phone]       Fax: [phone]   RxID:   [id number]

## 2010-06-17 NOTE — Letter (Signed)
Summary: CMN for CPAP Supplies/[facility]  CMN for CPAP Supplies/[facility]   Imported By: Sherian Rein 01/07/2010 08:52:41  _____________________________________________________________________  External Attachment:    Type:   Image     Comment:   External Document

## 2010-06-17 NOTE — Letter (Signed)
Summary: C W Lomax,MD  C W Lomax,MD   Imported By: Lester [place] 05/31/2009 08:18:02  _____________________________________________________________________  External Attachment:    Type:   Image     Comment:   External Document

## 2010-06-17 NOTE — Progress Notes (Signed)
Summary: Alcohol  Phone Note Call from Patient   Summary of Call: Pt wants to know if it is safe to drink wine with meds she is on.  Initial call taken by: Lamar Sprinkles,  May 02, 2008 1:11 PM  Follow-up for Phone Call        1 drink should be OK Follow-up by: Tresa Garter MD,  May 07, 2008 12:47 PM  Additional Follow-up for Phone Call Additional follow up Details #1::        called pt no answer left message to call office back  Additional Follow-up by: Windell Norfolk,  May 07, 2008 1:13 PM    Additional Follow-up for Phone Call Additional follow up Details #2::    pt called office back and made her aware of physcian instruction Follow-up by: Windell Norfolk,  May 07, 2008 1:17 PM

## 2010-06-17 NOTE — Assessment & Plan Note (Signed)
Summary: ROV/ MBW   Visit Type:  Follow-up Copy to:  Dr Shan Levans Primary Provider/Referring Provider:  Dr Posey Rea  CC:  Pt here for sleep follow up. Pt states is using CPAP machine every night approx 4 hours.  History of Present Illness: 52/F, obese for FU of obstructive sleep apnea diagnosed in 4/03 on autoCPAP. Weight gain of 58 lbs since study, rpt titration 12/09 showed adequate control of events with CPAP 15 cm, now on autoCPAP. Breakthrough gasping events have decreased-she describes one such event in church when awake. Feels presure is high sometimes when she has an arousal.  April 24, 2009 10:00 AM  reviewed downlaod  3/10 >> avg pressure 13 cm, study showed 15 cm >> ct autoCPAP Denies excessive daytime somnolence , Epworth Sleepiness Score 5/24. wt unchanged, no snoring.Mask ok pressure ok.  Current Medications (verified): 1)  Doxazosin Mesylate 4 Mg  Tabs (Doxazosin Mesylate) .[name].. 1 By Mouth Two Times A Day 2)  Atenolol 100 Mg  Tabs (Atenolol) .[name].. 1 By Mouth Two Times A Day 3)  Furosemide 40 Mg  Tabs (Furosemide) .... Once Daily 4)  Klor-Con M20 20 Meq  Tbcr (Potassium Chloride Crys Cr) .... Once Daily 5)  Nasonex 50 Mcg/act  Susp (Mometasone Furoate) .... Two Puffs Each Nostril Daily As Needed 6)  Sertraline Hcl 100 Mg Tabs (Sertraline Hcl) .[name].. 1 By Mouth Once Daily 7)  Vitamin D3 1000 Unit  Tabs (Cholecalciferol) .[name].. 1 Qd 8)  Adult Aspirin Ec Low Strength 81 Mg  Tbec (Aspirin) .[name].. 1 By Mouth Daily 9)  B Complex-B12   Tabs (B Complex Vitamins) .[name].. 1 By Mouth Daily 10)  Fexofenadine Hcl 180 Mg Tabs (Fexofenadine Hcl) .[name].. 1 Once Daily As Needed Allergies 11)  Voltaren 1 %  Gel (Diclofenac Sodium) .... Two Times A Day  To Qid As Needed 12)  Levothyroxine Sodium 175 Mcg  Tabs (Levothyroxine Sodium) .[name].. 1 By Mouth Daily 13)  Cpap .Marland KitchenMarland KitchenMarland Kitchen 15  Cmh2o  Allergies (verified): 1)  ! Diovan (Valsartan) 2)  ! Lotrel (Amlodipine Besy-Benazepril Hcl)  Past History:   Past Medical History: Last updated: 05/10/2007 Sleep Apnea Depression G E R D Hypertension Allergic Rhinitis OSA on CPAP   Dr Delford Field   GYN Dr Nicholas Lose  Social History: Last updated: 05/01/2008 Patient never smoked.  Occupation: lost her job in [employer] w/AE Single  Review of Systems  The patient denies anorexia, fever, weight loss, weight gain, vision loss, decreased hearing, hoarseness, chest pain, syncope, dyspnea on exertion, peripheral edema, prolonged cough, headaches, hemoptysis, abdominal pain, melena, hematochezia, severe indigestion/heartburn, hematuria, muscle weakness, difficulty walking, depression, unusual weight change, and abnormal bleeding.    Vital Signs:  Patient profile:   55 year old female Height:      66 inches Weight:      303.13 pounds O2 Sat:      96 % on Room air Temp:     97.7 degrees F oral Pulse rate:   60 / minute BP sitting:   112 / 78  (left arm) Cuff size:   large  Vitals Entered By: Zackery Barefoot CMA (April 24, 2009 9:43 AM)  O2 Flow:  Room air CC: Pt here for sleep follow up. Pt states is using CPAP machine every night approx 4 hours Comments Medications reviewed with patient Zackery Barefoot CMA  April 24, 2009 9:43 AM    Physical Exam  Additional Exam:  Gen. Pleasant, obese, in no distress ENT - no  lesions, no post nasal drip, class 2 airway Neck: No JVD, no thyromegaly, no carotid bruits Lungs: no use of accessory muscles, no dullness to percussion, clear without rales or rhonchi  Cardiovascular: Rhythm regular, heart sounds  normal, no murmurs or gallops, no peripheral edema Musculoskeletal: No deformities, no cyanosis or clubbing      Impression & Recommendations:  Problem # 1:  SLEEP APNEA (ICD-780.57)  ct autoCPAP , no changes . Compliance encouraged, wt loss emphasized, asked to avoid meds with sedative side effects, cautioned against driving when sleepy.   Orders: Est. Patient Level II (56387)   Medications Added to Medication List This Visit: 1)  Nasonex 50 Mcg/act Susp (Mometasone furoate) .... Two puffs each nostril daily as needed  Patient Instructions: 1)  Please schedule a follow-up appointment in 1 year. 2)  If you have new problems, please turn in the card so we can get  a download

## 2010-06-17 NOTE — Assessment & Plan Note (Signed)
Summary: Pulmonary OV   Chief Complaint:  f/u cpap - adjusting to new mask - pressure seems to much.  History of Present Illness:  This is a 55 year old, African-American female, here for her annual sleep apnea followup.      The patient comes in today for OSA follow-up.  The patient presents with dry nose and/or throat and epistaxis, but has no history of mask leak, skin irritation, pressure sores or abrasions, nasal congestion, rhinorrhea, cough, chest discomfort, aerophagia, pulling mask off in the middle of the night, waking up choking, and excessive daytime sleepiness.  There is no history of racing thoughts, going to bed not sleepy, feeling wired but tired, increased time in bed, watching TV in bed, reading in bed, daytime anxiety, and nightime anxiety.  The patient has been using CPAP as directed.  The percent usage of CPAP used 7 nights/week.  The following interventions have been reported as being effective: this is a 55 year old, white female with obstructive sleep apnea, here for her annual follow-up.heated humidification.  The patient reports no effect from chin strap.   11/12  No cough or dyspnea.  Is choking at night before put on cpap as lays flat.  Weight is up 20#. Some edema in feet.  No nasal congestion.  Using full face cpap maske.  Here for annual OSA f/u. Wakes up refreshed. No memory issues.  June 08, 2008 10:15 AM Had sleep study which showed good control with nasal mask and cpap 15cmh20.  Pt still having significant mask leak.  Feels too much pressure at this time. Feels like has no energy.  Still overweight     Updated Prior Medication List: DOXAZOSIN MESYLATE 4 MG  TABS (DOXAZOSIN MESYLATE) 1 by mouth two times a day ATENOLOL 100 MG  TABS (ATENOLOL) 1 by mouth two times a day FUROSEMIDE 40 MG  TABS (FUROSEMIDE) once daily KLOR-CON M20 20 MEQ  TBCR (POTASSIUM CHLORIDE CRYS CR) once daily LEVOXYL 137 MCG  TABS (LEVOTHYROXINE SODIUM) 1 by mouth qd NASONEX 50  MCG/ACT  SUSP (MOMETASONE FUROATE) Two puffs each nostril daily [BMN] SERTRALINE HCL 100 MG TABS (SERTRALINE HCL) 1 by mouth once daily VITAMIN D3 1000 UNIT  TABS (CHOLECALCIFEROL) 1 qd ADULT ASPIRIN EC LOW STRENGTH 81 MG  TBEC (ASPIRIN) 1 by mouth daily B COMPLEX-B12   TABS (B COMPLEX VITAMINS) 1 by mouth daily FEXOFENADINE HCL 180 MG TABS (FEXOFENADINE HCL) 1 once daily as needed allergies * CPAP 16 cmH2O  Current Allergies (reviewed today): ! DIOVAN (VALSARTAN) ! LOTREL (AMLODIPINE BESY-BENAZEPRIL HCL)  Past Medical History:    Reviewed history from 05/10/2007 and no changes required:       Sleep Apnea       Depression       G E R D       Hypertension       Allergic Rhinitis       OSA on CPAP   Dr Delford Field                     GYN Dr Nicholas Lose     Review of Systems  The patient denies anorexia, fever, vision loss, decreased hearing, hoarseness, chest pain, syncope, dyspnea on exertion, peripheral edema, prolonged cough, headaches, hemoptysis, abdominal pain, melena, hematochezia, and severe indigestion/heartburn.     Vital Signs:  Patient Profile:   55 Years Old Female Height:     66 inches Weight:      308 pounds O2 Sat:  97 % O2 treatment:    Room Air Temp:     98.2 degrees F oral Pulse rate:   82 / minute BP sitting:   130 / 80  (right arm) Cuff size:   large  Vitals Entered By: Abigail Miyamoto RN (June 08, 2008 10:00 AM)             Is Patient Diabetic? No Comments Medications reviewed with patient Abigail Miyamoto RN  June 08, 2008 10:06 AM      Physical Exam  General:     overweight-appearing.   Head:     Normocephalic and atraumatic without obvious abnormalities. No apparent alopecia or balding. Eyes:     No corneal or conjunctival inflammation noted. EOMI. Perrla. Funduscopic exam benign, without hemorrhages, exudates or papilledema. Vision grossly normal. Ears:     clear Nose:     clear nasal discharge.   Mouth:     Oral mucosa and  oropharynx without lesions or exudates.  Teeth in good repair. Neck:     No deformities, masses, or tenderness noted. Lungs:     Normal respiratory effort, chest expands symmetrically. Lungs are clear to auscultation, no crackles or wheezes. Heart:     Normal rate and regular rhythm. S1 and S2 normal without gallop, murmur, click, rub or other extra sounds. Abdomen:     Bowel sounds positive,abdomen soft and non-tender without masses, organomegaly or hernias noted.  obese  Msk:     No deformity or scoliosis noted of thoracic or lumbar spine.   Pulses:     R and L carotid,radial,femoral,dorsalis pedis and posterior tibial pulses are full and equal bilaterally Extremities:     trace left pedal edema and trace right pedal edema.   Neurologic:     No cranial nerve deficits noted. Station and gait are normal. Plantar reflexes are down-going bilaterally. DTRs are symmetrical throughout. Sensory, motor and coordinative functions appear intact. Skin:     Intact without suspicious lesions or rashes Cervical Nodes:     no significant adenopathy Psych:     Cognition and judgment appear intact. Alert and cooperative with normal attention span and concentration. No apparent delusions, illusions, hallucinations      Impression & Recommendations:  Problem # 1:  SLEEP APNEA (ICD-780.57) Assessment: Unchanged Severe sleep apnea.  failing standard cpap Rx plan: autoset T 5-20cmh20 with download at two weeks second opinion wiht Dr Vassie Loll  Orders: Est. Patient Level IV (16109) Sleep Disorder Referral (Sleep Disorder) DME Referral (DME)   Medications Added to Medication List This Visit: 1)  Cpap  .Marland KitchenMarland KitchenMarland Kitchen 15  cmh2o  Complete Medication List: 1)  Doxazosin Mesylate 4 Mg Tabs (Doxazosin mesylate) .[name].. 1 by mouth two times a day 2)  Atenolol 100 Mg Tabs (Atenolol) .[name].. 1 by mouth two times a day 3)  Furosemide 40 Mg Tabs (Furosemide) .... Once daily 4)  Klor-con M20 20 Meq Tbcr (Potassium  chloride crys cr) .... Once daily 5)  Levoxyl 137 Mcg Tabs (Levothyroxine sodium) .[name].. 1 by mouth qd 6)  Nasonex 50 Mcg/act Susp (Mometasone furoate) .... Two puffs each nostril daily 7)  Sertraline Hcl 100 Mg Tabs (Sertraline hcl) .[name].. 1 by mouth once daily 8)  Vitamin D3 1000 Unit Tabs (Cholecalciferol) .[name].. 1 qd 9)  Adult Aspirin Ec Low Strength 81 Mg Tbec (Aspirin) .[name].. 1 by mouth daily 10)  B Complex-b12 Tabs (B complex vitamins) .[name].. 1 by mouth daily 11)  Fexofenadine Hcl 180 Mg Tabs (Fexofenadine hcl) .Marland KitchenMarland KitchenMarland Kitchen 1  once daily as needed allergies 12)  Cpap  .Marland KitchenMarland KitchenMarland Kitchen 15  cmh2o   Patient Instructions: 1)  An AutosetT cpap machine will be obtained from [facility] 2)  An appointment with Dr Vassie Loll will be made to review results from the AutoSet T download report

## 2010-06-17 NOTE — Assessment & Plan Note (Signed)
Summary: 4 mo rov /nws $50   Vital Signs:  Patient Profile:   55 Years Old Female Height:     66 inches Weight:      304 pounds Temp:     97.2 degrees F oral Pulse rate:   68 / minute BP sitting:   158 / 94  (left arm)  Vitals Entered By: Tora Perches (May 01, 2008 8:36 AM)                 Chief Complaint:  Multiple medical problems or concerns.  History of Present Illness: The patient presents for a follow up of hypertension, OSA, hyperlipidemia     Current Allergies (reviewed today): ! DIOVAN (VALSARTAN) ! LOTREL (AMLODIPINE BESY-BENAZEPRIL HCL)  Past Medical History:    Reviewed history from 05/10/2007 and no changes required:       Sleep Apnea       Depression       G E R D       Hypertension       Allergic Rhinitis       OSA on CPAP   Dr Delford Field                     GYN Dr Nicholas Lose   Family History:    Reviewed history from 05/10/2007 and no changes required:       Family History Hypertension  Social History:    Reviewed history from 12/29/2007 and no changes required:       Patient never smoked.        Occupation: lost her job in [employer] w/AE       Single    Review of Systems  The patient denies fever, weight loss, syncope, and dyspnea on exertion.     Physical Exam  General:     overweight-appearing.   Ears:     External ear exam shows no significant lesions or deformities.  Otoscopic examination reveals clear canals, tympanic membranes are intact bilaterally without bulging, retraction, inflammation or discharge. Hearing is grossly normal bilaterally. Nose:     External nasal examination shows no deformity or inflammation. Nasal mucosa are pink and moist without lesions or exudates. Mouth:     Oral mucosa and oropharynx without lesions or exudates.  Teeth in good repair. Neck:     No deformities, masses, or tenderness noted. Lungs:     Normal respiratory effort, chest expands symmetrically. Lungs are clear to auscultation, no  crackles or wheezes. Heart:     Normal rate and regular rhythm. S1 and S2 normal without gallop, murmur, click, rub or other extra sounds. Abdomen:     Bowel sounds positive,abdomen soft and non-tender without masses, organomegaly or hernias noted. Extremities:     trace left pedal edema and trace right pedal edema.   Skin:     Intact without suspicious lesions or rashes Psych:     Cognition and judgment appear intact. Alert and cooperative with normal attention span and concentration. No apparent delusions, illusions, hallucinations    Impression & Recommendations:  Problem # 1:  HYPERTENSION (ICD-401.9) Assessment: Deteriorated Risks of noncompliance with treatment discussed. Compliance encouraged. Was taking Rx once daily before  Her updated medication list for this problem includes:    Doxazosin Mesylate 4 Mg Tabs (Doxazosin mesylate) .[name]... 1 by mouth two times a day    Atenolol 100 Mg Tabs (Atenolol) .[name]... 1 by mouth two times a day    Furosemide 40 Mg Tabs (Furosemide) .Marland KitchenMarland KitchenMarland KitchenMarland Kitchen  Once daily   Problem # 2:  OBESITY, MORBID (ICD-278.01) Assessment: Comment Only On a diet  Problem # 3:  DEPRESSION (ICD-311) Assessment: Unchanged  Her updated medication list for this problem includes:    Sertraline Hcl 100 Mg Tabs (Sertraline hcl) .[name]... 1 by mouth once daily   Problem # 4:  HYPERTENSION (ICD-401.9) Assessment: Comment Only  Her updated medication list for this problem includes:    Doxazosin Mesylate 4 Mg Tabs (Doxazosin mesylate) .[name]... 1 by mouth two times a day    Atenolol 100 Mg Tabs (Atenolol) .[name]... 1 by mouth two times a day    Furosemide 40 Mg Tabs (Furosemide) ..... Once daily   Problem # 5:  DEPRESSION (ICD-311) Assessment: Comment Only  Her updated medication list for this problem includes:    Sertraline Hcl 100 Mg Tabs (Sertraline hcl) .[name]... 1 by mouth once daily   Problem # 6:  DEPRESSION (ICD-311)  Her updated medication list for this problem includes:     Sertraline Hcl 100 Mg Tabs (Sertraline hcl) .[name]... 1 by mouth once daily   Complete Medication List: 1)  Doxazosin Mesylate 4 Mg Tabs (Doxazosin mesylate) .[name].. 1 by mouth two times a day 2)  Atenolol 100 Mg Tabs (Atenolol) .[name].. 1 by mouth two times a day 3)  Furosemide 40 Mg Tabs (Furosemide) .... Once daily 4)  Klor-con M20 20 Meq Tbcr (Potassium chloride crys cr) .... Once daily 5)  Levoxyl 137 Mcg Tabs (Levothyroxine sodium) .[name].. 1 by mouth qd 6)  Nasonex 50 Mcg/act Susp (Mometasone furoate) .... Two puffs each nostril daily 7)  Sertraline Hcl 100 Mg Tabs (Sertraline hcl) .[name].. 1 by mouth once daily 8)  Vitamin D3 1000 Unit Tabs (Cholecalciferol) .[name].. 1 qd 9)  Adult Aspirin Ec Low Strength 81 Mg Tbec (Aspirin) .[name].. 1 by mouth daily 10)  B Complex-b12 Tabs (B complex vitamins) .[name].. 1 by mouth daily 11)  Fexofenadine Hcl 180 Mg Tabs (Fexofenadine hcl) .[name].. 1 once daily as needed allergies 12)  Cpap  .Marland KitchenMarland Kitchen. 16 cmh2o   Patient Instructions: 1)  Please schedule a follow-up appointment in 4 months. 2)  BMP prior to visit, ICD-9: 3)  TSH prior to visit, ICD-9: 401.1  995.2 4)  Vit D   Prescriptions: FEXOFENADINE HCL 180 MG TABS (FEXOFENADINE HCL) 1 once daily as needed allergies  #90 x 3   Entered and Authorized by:   Tresa Garter MD   Signed by:   Tresa Garter MD on 05/01/2008   Method used:   Print then Give to Patient   RxID:   [id number] SERTRALINE HCL 100 MG TABS (SERTRALINE HCL) 1 by mouth once daily  #90 x 3   Entered and Authorized by:   Tresa Garter MD   Signed by:   Tresa Garter MD on 05/01/2008   Method used:   Print then Give to Patient   RxID:   [id number] NASONEX 50 MCG/ACT  SUSP (MOMETASONE FUROATE) Two puffs each nostril daily Brand medically necessary #3 x 3   Entered and Authorized by:   Tresa Garter MD   Signed by:   Tresa Garter MD on 05/01/2008   Method used:   Print then Give to Patient   RxID:    [id number] LEVOXYL 137 MCG  TABS (LEVOTHYROXINE SODIUM) 1 by mouth qd  #90 x 3   Entered and Authorized by:   Tresa Garter MD   Signed by:   Georgina Quint  Ram Haugan MD on 05/01/2008   Method used:   Print then Give to Patient   RxID:   [id number] KLOR-CON M20 20 MEQ  TBCR (POTASSIUM CHLORIDE CRYS CR) once daily  #90 x 3   Entered and Authorized by:   Tresa Garter MD   Signed by:   Tresa Garter MD on 05/01/2008   Method used:   Print then Give to Patient   RxID:   [id number] FUROSEMIDE 40 MG  TABS (FUROSEMIDE) once daily  #90 x 3   Entered and Authorized by:   Tresa Garter MD   Signed by:   Tresa Garter MD on 05/01/2008   Method used:   Print then Give to Patient   RxID:   [id number] ATENOLOL 100 MG  TABS (ATENOLOL) 1 by mouth two times a day  #180 x 3   Entered and Authorized by:   Tresa Garter MD   Signed by:   Tresa Garter MD on 05/01/2008   Method used:   Print then Give to Patient   RxID:   [id number] DOXAZOSIN MESYLATE 4 MG  TABS (DOXAZOSIN MESYLATE) 1 by mouth two times a day  #180 x 3   Entered and Authorized by:   Tresa Garter MD   Signed by:   Tresa Garter MD on 05/01/2008   Method used:   Print then Give to Patient   RxID:   [id number]  ]

## 2010-06-17 NOTE — Letter (Signed)
Summary: FMLA/[employer]  [employer]MLA/[employer]   Imported By: Esmeralda Links D'jimraou 09/15/2007 12:56:46  _____________________________________________________________________  External Attachment:    Type:   Image     Comment:   External Document

## 2010-06-17 NOTE — Assessment & Plan Note (Signed)
Summary: follow up/ mbw   Chief Complaint:  dry nose inside.. bleeds some no other problems and OSA follow-up.  History of Present Illness:  This is a 55 year old, African-American female, here for her annual sleep apnea followup.      The patient comes in today for OSA follow-up.  The patient presents with dry nose and/or throat and epistaxis, but has no history of mask leak, skin irritation, pressure sores or abrasions, nasal congestion, rhinorrhea, cough, chest discomfort, aerophagia, pulling mask off in the middle of the night, waking up choking, and excessive daytime sleepiness.  There is no history of racing thoughts, going to bed not sleepy, feeling wired but tired, increased time in bed, watching TV in bed, reading in bed, daytime anxiety, and nightime anxiety.  The patient has been using CPAP as directed.  The percent usage of CPAP used 7 nights/week.  The following interventions have been reported as being effective: this is a 55 year old, white female with obstructive sleep apnea, here for her annual follow-up.heated humidification.  The patient reports no effect from chin strap.     Current Allergies: ! DIOVAN (VALSARTAN) ! LOTREL (AMLODIPINE BESY-BENAZEPRIL HCL)  Past Medical History:    Reviewed history and no changes required:       Sleep Apnea       Depression       G E R D       Hypertension       Allergic Rhinitis   Family History:    Reviewed history and no changes required:  Social History:    Reviewed history and no changes required:       Patient never smoked.    Risk Factors:  Tobacco use:  never Alcohol use:  no   Review of Systems      See HPI   Vital Signs:  Patient Profile:   55 Years Old Female Height:     66 inches Weight:      289.3 pounds BMI:     46.86 O2 Sat:      99 % Temp:     97.7 degrees F oral Pulse rate:   57 / minute BP sitting:   138 / 66  (right arm) Cuff size:   large  Pt. in pain?   no  Vitals Entered By: Clarise Cruz Duncan Dull) (May 03, 2007 10:08 AM) Oxygen therapy Room Air                  Physical Exam  General:     obese.   Head:     normocephalic and atraumatic Nose:     clear nasal discharge and erythema.   Neck:     no masses, thyromegaly, or abnormal cervical nodes Lungs:     clear bilaterally to auscultation and percussion Heart:     regular rate and rhythm, S1, S2 without murmurs, rubs, gallops, or clicks Abdomen:     bowel sounds positive; abdomen soft and non-tender without masses, or organomegaly Msk:     no deformity or scoliosis noted with normal posture Pulses:     pulses normal Extremities:     no clubbing, cyanosis, edema, or deformity noted Skin:     intact without lesions or rashes Cervical Nodes:     no significant adenopathy     Impression & Recommendations:  Problem # 1:  SLEEP APNEA (ICD-780.57) Assessment: Deteriorated Improved with reduction in hypersomnolence.  HTN also better with CPAP treatment  plan: cont cpap at 16cmh20  Orders: Est. Patient Level IV (29528)   Problem # 2:  ALLERGIC RHINITIS (ICD-477.9) Assessment: New Nasal discharge and dry nose irritating the patient and affecting cpap compliance  plan: nasonex 2 puff dialy saline wash.  Her updated medication list for this problem includes:    Nasonex 50 Mcg/act Susp (Mometasone furoate) .[name]..[name] Two puffs each nostril daily  Orders: Est. Patient Level IV (41324)   Medications Added to Medication List This Visit: 1)  Nasonex 50 Mcg/act Susp (Mometasone furoate) .... Two puffs each nostril daily  Complete Medication List: 1)  Adult Aspirin Ec Low Strength 81 Mg Tbec (Aspirin) .[name].. 1 by mouth daily 2)  B Complex-b12 Tabs (B complex vitamins) .[name].. 1 by mouth daily 3)  Cpap  .Marland KitchenMarland Kitchen. 16 cm 4)  Levoxyl 100 Mcg Tabs (Levothyroxine sodium) .[name].. 1 by mouth daily 5)  Atenolol 100 Mg Tabs (Atenolol) .[name].. 1 by mouth two times a day 6)  Zoloft 25 Mg Tabs (Sertraline hcl) .[name]..  1 by mouth daily 7)  Vivelle-dot 0.1 Mg/24hr Pttw (Estradiol) .... Apply 1 patch weekly 8)  Nasonex 50 Mcg/act Susp (Mometasone furoate) .... Two puffs each nostril daily   Patient Instructions: 1)  Please schedule a follow-up appointment in 1 year. 2)  Stay on CPAP at 16 cmh20 3)  Start nasonex two puff each nostril daily 4)  Use saline nasal spray at bedtime  3 - 4 sprays each nostril 5)  Patient advised to lose weight.   Prescriptions: NASONEX 50 MCG/ACT  SUSP (MOMETASONE FUROATE) Two puffs each nostril daily Brand medically necessary #1 x 6   Entered and Authorized by:   Storm Frisk MD   Signed by:   Storm Frisk MD on 05/03/2007   Method used:   Electronically sent to ...       [id number]       [address] [place], [place]  [address]       Ph: [phone]       Fax: [phone]   RxID:   [id number]  ]

## 2010-06-17 NOTE — Assessment & Plan Note (Signed)
Summary: 4 mth fu/jss   Vital Signs:  Patient Profile:   55 Years Old Female Height:     66 inches Weight:      299 pounds Temp:     97.7 degrees F oral Pulse rate:   57 / minute BP sitting:   146 / 84  (left arm)  Vitals Entered By: Tora Perches (December 29, 2007 8:11 AM)                 Chief Complaint:  Multiple medical problems or concerns.  History of Present Illness: The patient presents for a follow up of hypertension, GERD, depression     Current Allergies (reviewed today): ! DIOVAN (VALSARTAN) ! LOTREL (AMLODIPINE BESY-BENAZEPRIL HCL)  Past Medical History:    Reviewed history from 05/10/2007 and no changes required:       Sleep Apnea       Depression       G E R D       Hypertension       Allergic Rhinitis       OSA on CPAP   Dr Delford Field                     GYN Dr Nicholas Lose    Family History:    Reviewed history from 05/10/2007 and no changes required:       Family History Hypertension  Social History:    Reviewed history from 05/10/2007 and no changes required:       Patient never smoked.        Occupation: office       Single    Review of Systems       The patient complains of weight gain and dyspnea on exertion.  The patient denies fever, chest pain, syncope, prolonged cough, and abdominal pain.     Physical Exam  General:     overweight-appearing.   Head:     Normocephalic and atraumatic without obvious abnormalities. No apparent alopecia or balding. Ears:     External ear exam shows no significant lesions or deformities.  Otoscopic examination reveals clear canals, tympanic membranes are intact bilaterally without bulging, retraction, inflammation or discharge. Hearing is grossly normal bilaterally. Nose:     External nasal examination shows no deformity or inflammation. Nasal mucosa are pink and moist without lesions or exudates. Mouth:     Oral mucosa and oropharynx without lesions or exudates.  Teeth in good repair. Neck:     No  deformities, masses, or tenderness noted. Lungs:     Normal respiratory effort, chest expands symmetrically. Lungs are clear to auscultation, no crackles or wheezes. Heart:     Normal rate and regular rhythm. S1 and S2 normal without gallop, murmur, click, rub or other extra sounds. Abdomen:     Bowel sounds positive,abdomen soft and non-tender without masses, organomegaly or hernias noted. Msk:     No deformity or scoliosis noted of thoracic or lumbar spine.   Pulses:     R and L carotid,radial,femoral,dorsalis pedis and posterior tibial pulses are full and equal bilaterally Extremities:     trace left pedal edema and trace right pedal edema.   Neurologic:     No cranial nerve deficits noted. Station and gait are normal. Plantar reflexes are down-going bilaterally. DTRs are symmetrical throughout. Sensory, motor and coordinative functions appear intact. Skin:     Intact without suspicious lesions or rashes Psych:     Cognition and judgment appear intact. Alert  and cooperative with normal attention span and concentration. No apparent delusions, illusions, hallucinations    Impression & Recommendations:  Problem # 1:  HYPERTENSION (ICD-401.9) Assessment: Improved  Her updated medication list for this problem includes:    Doxazosin Mesylate 4 Mg Tabs (Doxazosin mesylate) .[name]... 1 by mouth two times a day    Atenolol 100 Mg Tabs (Atenolol) .[name]... 1 by mouth two times a day    Furosemide 40 Mg Tabs (Furosemide) ..... Once daily   Problem # 2:  OBESITY, MORBID (ICD-278.01) Assessment: Deteriorated Diet discussed  Problem # 3:  G E R D (ICD-530.81) Assessment: Unchanged On Rx  Problem # 4:  DEPRESSION (ICD-311)  Her updated medication list for this problem includes:    Sertraline Hcl 100 Mg Tabs (Sertraline hcl) .[name]... 1 by mouth once daily   Problem # 5:  SLEEP APNEA (ICD-780.57) Assessment: Deteriorated F/u with pulm Dr Delford Field  Complete Medication List: 1)  Doxazosin  Mesylate 4 Mg Tabs (Doxazosin mesylate) .[name].. 1 by mouth two times a day 2)  Atenolol 100 Mg Tabs (Atenolol) .[name].. 1 by mouth two times a day 3)  Furosemide 40 Mg Tabs (Furosemide) .... Once daily 4)  Klor-con M20 20 Meq Tbcr (Potassium chloride crys cr) .... Once daily 5)  Levoxyl 137 Mcg Tabs (Levothyroxine sodium) .[name].. 1 by mouth qd 6)  Nasonex 50 Mcg/act Susp (Mometasone furoate) .... Two puffs each nostril daily 7)  Sertraline Hcl 100 Mg Tabs (Sertraline hcl) .[name].. 1 by mouth once daily 8)  Vitamin D3 1000 Unit Tabs (Cholecalciferol) .[name].. 1 qd 9)  Adult Aspirin Ec Low Strength 81 Mg Tbec (Aspirin) .[name].. 1 by mouth daily 10)  B Complex-b12 Tabs (B complex vitamins) .[name].. 1 by mouth daily 11)  Triamcinolone Acetonide 0.5 % Crea (Triamcinolone acetonide) .... Apply bid to affected area 12)  Fexofenadine Hcl 180 Mg Tabs (Fexofenadine hcl) .[name].. 1 once daily as needed allergies 13)  Cpap  .Marland KitchenMarland Kitchen. 16 cm   Patient Instructions: 1)  Please schedule a follow-up appointment in 4 months. 2)  BMP prior to visit, ICD-9: 3)  TSH prior to visit, ICD-9:401.1  244.8   Prescriptions: KLOR-CON M20 20 MEQ  TBCR (POTASSIUM CHLORIDE CRYS CR) once daily  #30 x 12   Entered and Authorized by:   Tresa Garter MD   Signed by:   Tresa Garter MD on 12/29/2007   Method used:   Print then Give to Patient   RxID:   [id number] FUROSEMIDE 40 MG  TABS (FUROSEMIDE) once daily  #30 x 12   Entered and Authorized by:   Tresa Garter MD   Signed by:   Tresa Garter MD on 12/29/2007   Method used:   Print then Give to Patient   RxID:   [id number] SERTRALINE HCL 100 MG TABS (SERTRALINE HCL) 1 by mouth once daily  #90 x 3   Entered and Authorized by:   Tresa Garter MD   Signed by:   Tresa Garter MD on 12/29/2007   Method used:   Print then Give to Patient   RxID:   [id number] LEVOXYL 137 MCG  TABS (LEVOTHYROXINE SODIUM) 1 by mouth qd  #90 x 3   Entered and Authorized  by:   Tresa Garter MD   Signed by:   Tresa Garter MD on 12/29/2007   Method used:   Print then Give to Patient   RxID:   [id number] ATENOLOL 100 MG  TABS (  ATENOLOL) 1 by mouth two times a day  #180 x 3   Entered and Authorized by:   Tresa Garter MD   Signed by:   Tresa Garter MD on 12/29/2007   Method used:   Print then Give to Patient   RxID:   [id number] DOXAZOSIN MESYLATE 4 MG  TABS (DOXAZOSIN MESYLATE) 1 by mouth two times a day  #180 x 3   Entered and Authorized by:   Tresa Garter MD   Signed by:   Tresa Garter MD on 12/29/2007   Method used:   Print then Give to Patient   RxID:   [id number] KLOR-CON M20 20 MEQ  TBCR (POTASSIUM CHLORIDE CRYS CR) once daily  #30 x 12   Entered and Authorized by:   Tresa Garter MD   Signed by:   Tresa Garter MD on 12/29/2007   Method used:   Print then Give to Patient   RxID:   [id number] FUROSEMIDE 40 MG  TABS (FUROSEMIDE) once daily  #30 x 12   Entered and Authorized by:   Tresa Garter MD   Signed by:   Tresa Garter MD on 12/29/2007   Method used:   Print then Give to Patient   RxID:   [id number] SERTRALINE HCL 100 MG TABS (SERTRALINE HCL) 1 by mouth once daily  #90 x 3   Entered and Authorized by:   Tresa Garter MD   Signed by:   Tresa Garter MD on 12/29/2007   Method used:   Print then Give to Patient   RxID:   [id number] LEVOXYL 137 MCG  TABS (LEVOTHYROXINE SODIUM) 1 by mouth qd  #90 x 3   Entered and Authorized by:   Tresa Garter MD   Signed by:   Tresa Garter MD on 12/29/2007   Method used:   Print then Give to Patient   RxID:   [id number] ATENOLOL 100 MG  TABS (ATENOLOL) 1 by mouth two times a day  #180 x 3   Entered and Authorized by:   Tresa Garter MD   Signed by:   Tresa Garter MD on 12/29/2007   Method used:   Print then Give to Patient   RxID:   [id number] DOXAZOSIN  MESYLATE 4 MG  TABS (DOXAZOSIN MESYLATE) 1 by mouth two times a day  #180 x 3   Entered and Authorized by:   Tresa Garter MD   Signed by:   Tresa Garter MD on 12/29/2007   Method used:   Print then Give to Patient   RxID:   [id number]  ]

## 2010-06-17 NOTE — Progress Notes (Signed)
Summary: BP and thyroid med   Phone Note Call from Patient   Summary of Call: Pt says that new bp med recently prescribed has caused her bp to go up. One reading was 227/103. Then she said it was her thyroid med. Need to call pt to clarify Initial call taken by: Lamar Sprinkles,  Sep 21, 2007 11:47 AM  Follow-up for Phone Call        left mess to call office back ....................Marland KitchenLamar Sprinkles  Sep 21, 2007 4:55 PM   Additional Follow-up for Phone Call Additional follow up Details #1::        Patient called back stating that she stopped taking the Levoxyl due to increased BP of 227/103. She stated back on "med that you had her on before, doesnt recall name" and now has a BP of 148/73. Please advise   *not sure what old rx is Additional Follow-up by: Rock Nephew CMA,  Sep 22, 2007 9:49 AM    Additional Follow-up for Phone Call Additional follow up Details #2::    Take meds as per Med. list. Come for BMET and TSH 401.1 tomorrow OV next wk or tomorrow if BP is veryy high  Follow-up by: Tresa Garter MD,  Sep 22, 2007 4:45 PM  Additional Follow-up for Phone Call Additional follow up Details #3:: Details for Additional Follow-up Action Taken: apt this am avail pt will do labs prior Additional Follow-up by: Lamar Sprinkles,  Sep 23, 2007 8:03 AM

## 2010-06-17 NOTE — Assessment & Plan Note (Signed)
Summary: 3 MO FU/$50/PN   Vital Signs:  Patient profile:   55 year old female Weight:      298 pounds Temp:     99 degrees F oral Pulse rate:   71 / minute BP sitting:   124 / 76  (left arm)  Vitals Entered By: Tora Perches (November 26, 2008 8:16 AM) CC: f/u Is Patient Diabetic? No   Primary Care Provider:  Dr Posey Rea  CC:  f/u.  History of Present Illness: The patient presents for a follow up of hypertension, hypothyroidism, hyperlipidemia   Current Medications (verified): 1)  Doxazosin Mesylate 4 Mg  Tabs (Doxazosin Mesylate) .[name].. 1 By Mouth Two Times A Day 2)  Atenolol 100 Mg  Tabs (Atenolol) .[name].. 1 By Mouth Two Times A Day 3)  Furosemide 40 Mg  Tabs (Furosemide) .... Once Daily 4)  Klor-Con M20 20 Meq  Tbcr (Potassium Chloride Crys Cr) .... Once Daily 5)  Levoxyl 137 Mcg  Tabs (Levothyroxine Sodium) .[name].. 1 By Mouth Qd 6)  Nasonex 50 Mcg/act  Susp (Mometasone Furoate) .... Two Puffs Each Nostril Daily 7)  Sertraline Hcl 100 Mg Tabs (Sertraline Hcl) .[name].. 1 By Mouth Once Daily 8)  Vitamin D3 1000 Unit  Tabs (Cholecalciferol) .[name].. 1 Qd 9)  Adult Aspirin Ec Low Strength 81 Mg  Tbec (Aspirin) .[name].. 1 By Mouth Daily 10)  B Complex-B12   Tabs (B Complex Vitamins) .[name].. 1 By Mouth Daily 11)  Fexofenadine Hcl 180 Mg Tabs (Fexofenadine Hcl) .[name].. 1 Once Daily As Needed Allergies 12)  Cpap .Marland KitchenMarland KitchenMarland Kitchen 15  Cmh2o 13)  Voltaren 1 %  Gel (Diclofenac Sodium) .... Two Times A Day  To Qid As Needed 14)  Furosemide 20 Mg Tabs (Furosemide) .... Take 1 Tab By Mouth Every Day  Allergies: 1)  ! Diovan (Valsartan) 2)  ! Lotrel (Amlodipine Besy-Benazepril Hcl)  Past History:  Past Medical History: Last updated: 05/10/2007 Sleep Apnea Depression G E R D Hypertension Allergic Rhinitis OSA on CPAP   Dr Delford Field   GYN Dr Nicholas Lose  Family History: Last updated: 06/27/2008 Family History Hypertension mother-breast CA Mat grandmother-colon CA  Social History: Last updated: 05/01/2008 Patient  never smoked.  Occupation: lost her job in [employer] w/[employer] Single  Past Surgical History: R ankle fracture  surgery 4/10  Family History: Reviewed history from 06/27/2008 and no changes required. Family History Hypertension mother-breast CA Mat grandmother-colon CA  Social History: Reviewed history from 05/01/2008 and no changes required. Patient never smoked.  Occupation: lost her job in [employer] w/[employer] Single  Review of Systems  The patient denies fever, chest pain, syncope, hemoptysis, and abdominal pain.    Physical Exam  General:   in no acute distress obese.   Head:  normocephalic and atraumatic Nose:  no deformity, discharge, inflammation, or lesions Mouth:  no deformity or lesions    Neck:  no masses, thyromegaly, or abnormal cervical nodes Lungs:  clear bilaterally to auscultation and percussion Heart:  regular rate and rhythm, S1, S2 without murmurs, rubs, gallops, or clicks Abdomen:  bowel sounds positive; abdomen soft and non-tender without masses, or organomegaly Msk:  R ankle  Extremities:  trace lower extremities edema Neurologic:  CN II-XII grossly intact with normal reflexes, coordination, muscle strength and tone Skin:  intact without lesions or rashes Psych:  alert and cooperative; normal mood and affect; normal attention span and concentration   Impression & Recommendations:  Problem # 1:  OBESITY, MORBID (ICD-278.01) Assessment Improved Discussed  Problem # 2:  HYPOTHYROIDISM, UNSPECIFIED (ICD-244.9) Assessment: Deteriorated  The following medications were removed from the medication list:    Levoxyl 137 Mcg Tabs (Levothyroxine sodium) .[name]... 1 by mouth qd Her updated medication list for this problem includes:    Levothyroxine Sodium 175 Mcg Tabs (Levothyroxine sodium) .[name]... 1 by mouth daily  Problem # 3:  HYPERTENSION (ICD-401.9) Assessment: Improved  Her updated medication list for this problem includes:    Doxazosin Mesylate 4 Mg  Tabs (Doxazosin mesylate) .[name]... 1 by mouth two times a day    Atenolol 100 Mg Tabs (Atenolol) .[name]... 1 by mouth two times a day    Furosemide 40 Mg Tabs (Furosemide) ..... Once daily    Furosemide 20 Mg Tabs (Furosemide) .[name]... Take 1 tab by mouth every day  BP today: 124/76 Prior BP: 144/90 (08/28/2008)  Labs Reviewed: K+: 3.5 (11/22/2008) Creat: : 0.8 (11/22/2008)   Chol: 191 (05/03/2007)   HDL: 37.6 (05/03/2007)   LDL: 132 (05/03/2007)   TG: 106 (05/03/2007)  Problem # 4:  DEPRESSION (ICD-311) Assessment: Improved  Her updated medication list for this problem includes:    Sertraline Hcl 100 Mg Tabs (Sertraline hcl) .[name]... 1 by mouth once daily  Complete Medication List: 1)  Doxazosin Mesylate 4 Mg Tabs (Doxazosin mesylate) .[name].. 1 by mouth two times a day 2)  Atenolol 100 Mg Tabs (Atenolol) .[name].. 1 by mouth two times a day 3)  Furosemide 40 Mg Tabs (Furosemide) .... Once daily 4)  Klor-con M20 20 Meq Tbcr (Potassium chloride crys cr) .... Once daily 5)  Nasonex 50 Mcg/act Susp (Mometasone furoate) .... Two puffs each nostril daily 6)  Sertraline Hcl 100 Mg Tabs (Sertraline hcl) .[name].. 1 by mouth once daily 7)  Vitamin D3 1000 Unit Tabs (Cholecalciferol) .[name].. 1 qd 8)  Adult Aspirin Ec Low Strength 81 Mg Tbec (Aspirin) .[name].. 1 by mouth daily 9)  B Complex-b12 Tabs (B complex vitamins) .[name].. 1 by mouth daily 10)  Fexofenadine Hcl 180 Mg Tabs (Fexofenadine hcl) .[name].. 1 once daily as needed allergies 11)  Voltaren 1 % Gel (Diclofenac sodium) .... Two times a day  to qid as needed 12)  Furosemide 20 Mg Tabs (Furosemide) .... Take 1 tab by mouth every day 13)  Levothyroxine Sodium 175 Mcg Tabs (Levothyroxine sodium) .[name].. 1 by mouth daily 14)  Cpap  .Marland KitchenMarland KitchenMarland Kitchen 15  cmh2o  Patient Instructions: 1)  Please schedule a follow-up appointment in 3 months. 2)  labs in 1-2 mo 3)  BMP prior to visit, ICD-9: 4)  TSH prior to visit, ICD-9: 244.8 5)  Try to eat more raw plant food, fresh and dry fruit, raw  almonds, leafy vegies, whole foods and less red meat, less animal fat. Avoid processed foods (canned soups, hot dogs, sausage , frozen dinners). Avoid corn syrup or aspartam and Splenda  containing drinks. Make your own salad dressing with olive oil, wine vinegar, garlic etc. 6)  www.greensmoothiegirl.com 7)  www.rawfamily.com Prescriptions: LEVOTHYROXINE SODIUM 175 MCG  TABS (LEVOTHYROXINE SODIUM) 1 by mouth daily  #90 x 3   Entered and Authorized by:   Tresa Garter MD   Signed by:   Tresa Garter MD on 11/26/2008   Method used:   Electronically to        [facility] # [phone]* (retail)       [address]       [place], [place]  [address]       Ph: [phone]       Fax: [phone]   RxID:   [id number]

## 2010-06-17 NOTE — Progress Notes (Signed)
Summary: 1 wk supply    Prescriptions: SERTRALINE HCL 100 MG TABS (SERTRALINE HCL) 1 by mouth once daily  #14 x 1   Entered by:   Lamar Sprinkles   Authorized by:   Tresa Garter MD   Signed by:   Lamar Sprinkles on 08/13/2008   Method used:   Electronically to        [facility] # [phone]* (retail)       [address]       [place], [place]  [address]       Ph: [phone]       Fax: [phone]   RxID:   [id number]

## 2010-06-17 NOTE — Miscellaneous (Signed)
Summary: thyroid  Clinical Lists Changes  Medications: Removed medication of LEVOXYL 150 MCG  TABS (LEVOTHYROXINE SODIUM) 1 by mouth qq Added new medication of LEVOXYL 137 MCG  TABS (LEVOTHYROXINE SODIUM) 1 by mouth qd - Signed Rx of LEVOXYL 137 MCG  TABS (LEVOTHYROXINE SODIUM) 1 by mouth qd;  #90 x 3;  Signed;  Entered by: Tresa Garter MD;  Authorized by: Tresa Garter MD;  Method used: Print then Mail to Patient    Prescriptions: LEVOXYL 137 MCG  TABS (LEVOTHYROXINE SODIUM) 1 by mouth qd  #90 x 3   Entered and Authorized by:   Tresa Garter MD   Signed by:   Tresa Garter MD on 09/08/2007   Method used:   Print then Mail to Patient   RxID:   [id number]

## 2010-06-17 NOTE — Assessment & Plan Note (Signed)
Summary: 4 MO ROV /$50 Tracy Sampson   Vital Signs:  Patient profile:   55 year old female Height:      66 inches Weight:      307 pounds BMI:     49.73 Temp:     97.7 degrees F oral Pulse rate:   63 / minute BP sitting:   144 / 90  (left arm)  Vitals Entered By: Tora Perches (August 28, 2008 8:06 AM) CC: follow up for multiple medical problems Is Patient Diabetic? No   Visit Type:  follow up Primary Care Provider:  Dr Posey Rea  CC:  follow up for multiple medical problems.  History of Present Illness: The patient presents for a follow up for HTN, diabetes, and obesity, she complains of painful L knee X 2 weeks  she used ice/hot with some relieve, she feels she needs more help.  She reports elevated PBs at home 171/90, no med changes,   Allergies bothering her but allegra has helped.     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 1 By Mouth Two Times A Day 2)  Atenolol 100 Mg  Tabs (Atenolol) .[name].. 1 By Mouth Two Times A Day 3)  Furosemide 40 Mg  Tabs (Furosemide) .... Once Daily 4)  Klor-Con M20 20 Meq  Tbcr (Potassium Chloride Crys Cr) .... Once Daily 5)  Levoxyl 137 Mcg  Tabs (Levothyroxine Sodium) .[name].. 1 By Mouth Qd 6)  Nasonex 50 Mcg/act  Susp (Mometasone Furoate) .... Two Puffs Each Nostril Daily 7)  Sertraline Hcl 100 Mg Tabs (Sertraline Hcl) .[name].. 1 By Mouth Once Daily 8)  Vitamin D3 1000 Unit  Tabs (Cholecalciferol) .[name].. 1 Qd 9)  Adult Aspirin Ec  Low Strength 81 Mg  Tbec (Aspirin) .[name].. 1 By Mouth Daily 10)  B Complex-B12   Tabs (B Complex Vitamins) .[name].. 1 By Mouth Daily 11)  Fexofenadine Hcl 180 Mg Tabs (Fexofenadine Hcl) .[name].. 1 Once Daily As Needed Allergies 12)  Cpap .Marland KitchenMarland KitchenMarland Kitchen 15  Cmh2o  Current Medications (verified): 1)  Doxazosin Mesylate 4 Mg  Tabs (Doxazosin Mesylate) .[name].. 1 By Mouth Two Times A Day 2)  Atenolol 100 Mg  Tabs (Atenolol) .[name].. 1 By Mouth Two Times A Day 3)  Furosemide 40 Mg  Tabs (Furosemide) .... Once Daily 4)  Klor-Con M20 20 Meq  Tbcr (Potassium Chloride Crys Cr) .... Once Daily 5)  Levoxyl 137 Mcg  Tabs (Levothyroxine Sodium) .[name].. 1 By Mouth Qd 6)  Nasonex 50 Mcg/act  Susp (Mometasone Furoate) .... Two Puffs Each Nostril Daily 7)  Sertraline Hcl 100 Mg Tabs (Sertraline Hcl) .[name].. 1 By Mouth Once Daily 8)  Vitamin D3 1000 Unit  Tabs (Cholecalciferol) .[name].. 1 Qd 9)  Adult Aspirin Ec Low Strength 81 Mg  Tbec (Aspirin) .[name].. 1 By Mouth Daily 10)  B Complex-B12   Tabs (B Complex Vitamins) .[name].. 1 By Mouth Daily 11)  Fexofenadine Hcl 180 Mg Tabs (Fexofenadine Hcl) .[name].. 1 Once Daily As  Needed Allergies 12)  Cpap .Marland KitchenMarland KitchenMarland Kitchen 15  Cmh2o  Allergies: 1)  ! Diovan (Valsartan) 2)  ! Lotrel (Amlodipine Besy-Benazepril Hcl)  Past History:  Past Medical History:    Reviewed history from 05/10/2007 and no changes required:    Sleep Apnea    Depression    G E R D    Hypertension    Allergic Rhinitis    OSA on CPAP   Dr Delford Field            GYN Dr Nicholas Lose  Physical Exam  General:   in no acute distress obese.   Eyes:  vision grossly intact.   Nose:  no deformity, discharge, inflammation, or lesions Mouth:  no deformity or lesions    Neck:  no masses, thyromegaly, or abnormal cervical nodes Lungs:  clear bilaterally to auscultation and percussion Heart:  regular rate and rhythm, S1, S2 without murmurs, rubs, gallops, or clicks Abdomen:  bowel sounds positive; abdomen soft and non-tender without masses, or organomegaly Msk:  no  deformity or scoliosis noted with normal posture Extremities:  trace lower extremities edema Neurologic:  CN II-XII grossly intact with normal reflexes, coordination, muscle strength and tone Skin:  intact without lesions or rashes Psych:  alert and cooperative; normal mood and affect; normal attention span and concentration   Impression & Recommendations:  Problem # 1:  VITAMIN D DEFICIENCY (ICD-268.9) Assessment Improved On prescription therapy   Problem # 2:  KNEE PAIN (ICD-719.46) R Assessment: Deteriorated Loose wt Her updated medication list for this problem includes:    Adult Aspirin Ec Low Strength 81 Mg Tbec (Aspirin) .[name]... 1 by mouth daily    Darvocet-n 100 100-650 Mg Tabs (Propoxyphene n-apap) .[name]... 1 by mouth up to  4 times daily as neede for pain  Problem # 3:  HYPOTHYROIDISM, UNSPECIFIED (ICD-244.9) Assessment: Unchanged  Her updated medication list for this problem includes:    Levoxyl 137 Mcg Tabs (Levothyroxine sodium) .[name]... 1 by mouth qd  Problem # 4:  HYPERTENSION (ICD-401.9) Assessment: Unchanged  Her updated medication list for this problem includes:    Doxazosin Mesylate 4 Mg Tabs (Doxazosin mesylate) .[name]... 1 by mouth two times a day    Atenolol 100 Mg Tabs (Atenolol) .[name]... 1 by mouth two times a day    Furosemide 40 Mg Tabs (Furosemide) ..... Once daily    Furosemide 20 Mg Tabs (Furosemide) .[name]... Take 1 tab by mouth every day  Problem # 5:  ALLERGIC RHINITIS (ICD-477.9) Assessment: Deteriorated  Her updated medication list for this problem includes:    Nasonex 50 Mcg/act Susp (Mometasone furoate) .[name]..[name] Two puffs each nostril daily    Fexofenadine Hcl 180 Mg Tabs (Fexofenadine hcl) .[name]... 1 once daily as needed allergies  Complete Medication List: 1)  Doxazosin Mesylate 4 Mg Tabs (Doxazosin mesylate) .[name].. 1 by mouth two times a day 2)  Atenolol 100 Mg Tabs (Atenolol) .[name].. 1 by mouth two times a day 3)  Furosemide 40 Mg Tabs (Furosemide) .... Once  daily 4)  Klor-con M20 20 Meq Tbcr (Potassium chloride crys cr) .... Once daily 5)  Levoxyl 137 Mcg Tabs (Levothyroxine sodium) .[name].. 1 by mouth qd 6)  Nasonex 50 Mcg/act Susp (Mometasone furoate) .... Two puffs each nostril daily 7)  Sertraline Hcl 100 Mg Tabs (Sertraline hcl) .[name].. 1 by mouth once daily 8)  Vitamin D3 1000 Unit Tabs (Cholecalciferol) .[name].. 1 qd 9)  Adult Aspirin Ec Low Strength 81 Mg Tbec (Aspirin) .[name].. 1 by mouth daily 10)  B Complex-b12 Tabs (B complex vitamins) .[name].. 1 by mouth daily 11)  Fexofenadine Hcl 180 Mg Tabs (Fexofenadine hcl) .[name].. 1 once daily as needed allergies 12)  Cpap  .Marland KitchenMarland KitchenMarland Kitchen 15  cmh2o 13)  Voltaren 1 % Gel (Diclofenac sodium) .... Two times a day  to qid as needed 14)  Furosemide 20 Mg Tabs (Furosemide) .... Take 1 tab by mouth every day 15)  Darvocet-n 100 100-650 Mg Tabs (Propoxyphene n-apap) .[name].. 1 by mouth up to  4 times daily as neede for pain  Patient Instructions: 1)  Please schedule a follow-up appointment in 3 months. Prescriptions: DARVOCET-N 100 100-650 MG TABS (PROPOXYPHENE N-APAP) 1 by mouth up to  4 times daily as neede for pain  #90 x 3   Entered and Authorized by:   Tresa Garter MD   Signed by:   Tresa Garter MD on 08/28/2008   Method used:   Print then Give to Patient   RxID:   [id number] FUROSEMIDE 20 MG TABS (FUROSEMIDE) Take 1 tab by mouth every day  #90 x 3   Entered and Authorized by:   Tresa Garter MD   Signed by:   Tresa Garter MD on 08/28/2008   Method used:   Print then Give to Patient   RxID:   [id number] VOLTAREN 1 %  GEL (DICLOFENAC SODIUM) two times a day  to qid as needed  #120 g x 6   Entered and Authorized by:   Tresa Garter MD   Signed by:   Tresa Garter MD on 08/28/2008   Method used:   Print then Give to Patient   RxID:   [id number]

## 2010-06-17 NOTE — Progress Notes (Signed)
Summary: letter  Phone Note Call from Patient Call back at Home Phone [phone]   Caller: Patient Call For: Tracy Sampson Reason for Call: Talk to Nurse Summary of Call: Had some carpet removed due to allergies.  Able to breath better without carpet.  She can take this off her taxes if she has a note from her physician stating that the carpet needed to be removed due to medical reasons.  Can we do this? Initial call taken by: Eugene Gavia,  July 15, 2009 2:55 PM  Follow-up for Phone Call        dr Tracy Sampson pt wants to know if you can give letter stating the carpet needed to be removed    Philipp Deputy [facility]  July 15, 2009 3:41 PM   Additional Follow-up for Phone Call Additional follow up Details #1::        defer to her PMD - Dr Posey Rea. I did not advise re: allergies. Additional Follow-up by: Comer Locket. Tracy Loll MD,  July 15, 2009 5:41 PM    Additional Follow-up for Phone Call Additional follow up Details #2::    pt aware to call primary md for letter per dr Tracy Sampson  Follow-up by: Philipp Deputy CMA,  July 16, 2009 9:35 AM

## 2010-06-17 NOTE — Assessment & Plan Note (Signed)
Summary: discuss thyroid/labs prior SD   Vital Signs:  Patient Profile:   55 Years Old Female Height:     66 inches Weight:      296 pounds Temp:     99.1 degrees F oral Pulse rate:   59 / minute BP sitting:   178 / 90  (left arm)  Vitals Entered By: Tora Perches (Sep 23, 2007 9:58 AM)                 Chief Complaint:  Multiple medical problems or concerns.  History of Present Illness: C/o high BP.    Current Allergies (reviewed today): ! DIOVAN (VALSARTAN) ! LOTREL (AMLODIPINE BESY-BENAZEPRIL HCL)  Past Medical History:    Reviewed history from 05/10/2007 and no changes required:       Sleep Apnea       Depression       G E R D       Hypertension       Allergic Rhinitis       OSA on CPAP   Dr Delford Field                     GYN Dr Nicholas Lose   Family History:    Reviewed history from 05/10/2007 and no changes required:       Family History Hypertension  Social History:    Reviewed history from 05/10/2007 and no changes required:       Patient never smoked.        Occupation:       Single    Review of Systems       The patient complains of weight gain.     Physical Exam  General:     overweight-appearing.   Mouth:     Oral mucosa and oropharynx without lesions or exudates.  Teeth in good repair. Neck:     No deformities, masses, or tenderness noted. Lungs:     Normal respiratory effort, chest expands symmetrically. Lungs are clear to auscultation, no crackles or wheezes. Heart:     Normal rate and regular rhythm. S1 and S2 normal without gallop, murmur, click, rub or other extra sounds. Abdomen:     Bowel sounds positive,abdomen soft and non-tender without masses, organomegaly or hernias noted. Extremities:     trace left pedal edema and trace right pedal edema.   Neurologic:     alert & oriented X3.      Impression & Recommendations:  Problem # 1:  HYPERTENSION (ICD-401.9) Assessment: Deteriorated Risks of noncompliance with treatment  discussed ( was not taking Furosemide). Compliance encouraged.  Her updated medication list for this problem includes:    Doxazosin Mesylate 4 Mg Tabs (Doxazosin mesylate) .[name]... 1 by mouth two times a day increase to bid    Atenolol 100 Mg Tabs (Atenolol) .[name]... 1 by mouth two times a day    Furosemide 40 Mg Tabs (Furosemide) ..... Once daily  Her updated medication list for this problem includes:    Doxazosin Mesylate 4 Mg Tabs (Doxazosin mesylate) .[name]... 1 by mouth two times a day    Atenolol 100 Mg Tabs (Atenolol) .[name]... 1 by mouth two times a day    Furosemide 40 Mg Tabs (Furosemide) ..... Once daily   Problem # 2:  HYPOTHYROIDISM, UNSPECIFIED (ICD-244.9) Assessment: Comment Only TSH today Her updated medication list for this problem includes:    Levoxyl 137 Mcg Tabs (Levothyroxine sodium) .[name]... 1 by mouth qd   Problem #  3:  OBESITY, MORBID (ICD-278.01) Assessment: Deteriorated Ref to [facility], info given  Complete Medication List: 1)  Doxazosin Mesylate 4 Mg Tabs (Doxazosin mesylate) .[name].. 1 by mouth two times a day 2)  Atenolol 100 Mg Tabs (Atenolol) .[name].. 1 by mouth two times a day 3)  Furosemide 40 Mg Tabs (Furosemide) .... Once daily 4)  Klor-con M20 20 Meq Tbcr (Potassium chloride crys cr) .... Once daily 5)  Levoxyl 137 Mcg Tabs (Levothyroxine sodium) .[name].. 1 by mouth qd 6)  Nasonex 50 Mcg/act Susp (Mometasone furoate) .... Two puffs each nostril daily 7)  Sertraline Hcl 100 Mg Tabs (Sertraline hcl) .[name].. 1 by mouth once daily 8)  Vitamin D3 1000 Unit Tabs (Cholecalciferol) .[name].. 1 qd 9)  Adult Aspirin Ec Low Strength 81 Mg Tbec (Aspirin) .[name].. 1 by mouth daily 10)  B Complex-b12 Tabs (B complex vitamins) .[name].. 1 by mouth daily 11)  Triamcinolone Acetonide 0.5 % Crea (Triamcinolone acetonide) .... Apply bid to affected area 12)  Cpap  .Marland KitchenMarland Kitchen. 16 cm   Patient Instructions: 1)  Please schedule a follow-up appointment in 3 months. 2)  BMP prior to visit, ICD-9: 3)  TSH prior to visit,  ICD-9: 401.1 4)  [email or web address]   Prescriptions: KLOR-CON M20 20 MEQ  TBCR (POTASSIUM CHLORIDE CRYS CR) once daily  #30 x 12   Entered and Authorized by:   Tresa Garter MD   Signed by:   Tresa Garter MD on 09/23/2007   Method used:   Electronically sent to ...       [id number]       [address] [place], [place]  [address]       Ph: [phone]       Fax: [phone]   RxID:   [id number] FUROSEMIDE 40 MG  TABS (FUROSEMIDE) once daily  #30 x 12   Entered and Authorized by:   Tresa Garter MD   Signed by:   Tresa Garter MD on 09/23/2007   Method used:   Electronically sent to ...       [id number]*       [address] [place], [place]  [address]       Ph: [phone]       Fax: [phone]   RxID:   [id number] DOXAZOSIN MESYLATE 4 MG  TABS (DOXAZOSIN MESYLATE) 1 by mouth two times a day  #60 x 12   Entered and Authorized by:   Tresa Garter MD   Signed by:   Tresa Garter MD on 09/23/2007   Method used:   Electronically sent to ...       [facility]  [id number]*       [address] [place], [place]  [address]       Ph: [phone]       Fax: [phone]   RxID:   [id number]  ]

## 2010-06-17 NOTE — Assessment & Plan Note (Signed)
Summary: CPX/NML   Vital Signs:  Patient Profile:   55 Years Old Female Height:     66 inches Weight:      286 pounds Temp:     98.5 degrees F oral Pulse rate:   53 / minute BP sitting:   182 / 88  (left arm)  Vitals Entered By: Tora Perches (May 10, 2007 8:19 AM)             Is Patient Diabetic? No     Chief Complaint:  Preventive Care.  History of Present Illness: The patient presents for a wellness examination   Current Allergies: ! DIOVAN (VALSARTAN) ! LOTREL (AMLODIPINE BESY-BENAZEPRIL HCL)  Past Medical History:    Reviewed history from 05/03/2007 and no changes required:       Sleep Apnea       Depression       G E R D       Hypertension       Allergic Rhinitis       OSA on CPAP   Dr Delford Field                     GYN Dr Nicholas Lose   Family History:    Reviewed history and no changes required:       Family History Hypertension  Social History:    Patient never smoked.     Occupation:    Single    Review of Systems  The patient denies hoarseness, chest pain, peripheral edema, and hematochezia.     Physical Exam  General:     Well-developed,well-nourished,in no acute distress; alert,appropriate and cooperative throughout examination Eyes:     No corneal or conjunctival inflammation noted. EOMI. Perrla. Funduscopic exam benign, without hemorrhages, exudates or papilledema. Vision grossly normal. Nose:     External nasal examination shows no deformity or inflammation. Nasal mucosa are pink and moist without lesions or exudates. Mouth:     Oral mucosa and oropharynx without lesions or exudates.  Teeth in good repair. Neck:     No deformities, masses, or tenderness noted. Lungs:     Normal respiratory effort, chest expands symmetrically. Lungs are clear to auscultation, no crackles or wheezes. Heart:     Normal rate and regular rhythm. S1 and S2 normal without gallop, murmur, click, rub or other extra sounds. Abdomen:     Bowel sounds  positive,abdomen soft and non-tender without masses, organomegaly or hernias noted. Msk:     No deformity or scoliosis noted of thoracic or lumbar spine.   Neurologic:     No cranial nerve deficits noted. Station and gait are normal. Plantar reflexes are down-going bilaterally. DTRs are symmetrical throughout. Sensory, motor and coordinative functions appear intact. Skin:     Intact without suspicious lesions or rashes Psych:     Cognition and judgment appear intact. Alert and cooperative with normal attention span and concentration. No apparent delusions, illusions, hallucinations    Impression & Recommendations:  Problem # 1:  Preventive Health Care (ICD-V70.0) The labs were reviewd with the patient. Refused shots. Gyn care/ mammogr. per her gyn.   Problem # 2:  HYPERTENSION (ICD-401.9)  Her updated medication list for this problem includes:    Atenolol 100 Mg Tabs (Atenolol) .[name]... 1 by mouth two times a day    Doxazosin Mesylate 4 Mg Tabs (Doxazosin mesylate) .[name]... 1 by mouth daily   Problem # 3:  HYPOTHYROIDISM, UNSPECIFIED (ICD-244.9)  The following medications were removed from the  medication list:    Levoxyl 100 Mcg Tabs (Levothyroxine sodium) .[name]... 1 by mouth daily   The following medications were removed from the medication list:    Levoxyl 100 Mcg Tabs (Levothyroxine sodium) .[name]... 1 by mouth daily  Her updated medication list for this problem includes:    Levoxyl 150 Mcg Tabs (Levothyroxine sodium) .[name]... 1 by mouth qq   Complete Medication List: 1)  Atenolol 100 Mg Tabs (Atenolol) .[name].. 1 by mouth two times a day 2)  Nasonex 50 Mcg/act Susp (Mometasone furoate) .... Two puffs each nostril daily 3)  Levoxyl 150 Mcg Tabs (Levothyroxine sodium) .[name].. 1 by mouth qq 4)  Sertraline Hcl 100 Mg Tabs (Sertraline hcl) .[name].. 1 by mouth once daily 5)  Doxazosin Mesylate 4 Mg Tabs (Doxazosin mesylate) .[name].. 1 by mouth daily 6)  Vitamin D3 1000 Unit Tabs (Cholecalciferol) .[name].. 1  qd 7)  Adult Aspirin Ec Low Strength 81 Mg Tbec (Aspirin) .[name].. 1 by mouth daily 8)  B Complex-b12 Tabs (B complex vitamins) .[name].. 1 by mouth daily 9)  Cpap  .Marland KitchenMarland Kitchen. 16 cm   Patient Instructions: 1)  Please schedule a follow-up appointment in 4 months. 2)  TSH prior to visit, ICD-9:244.8    Prescriptions: DOXAZOSIN MESYLATE 4 MG  TABS (DOXAZOSIN MESYLATE) 1 by mouth daily  #90 x 3   Entered and Authorized by:   Tresa Garter MD   Signed by:   Tresa Garter MD on 05/10/2007   Method used:   Print then Give to Patient   RxID:   [id number] SERTRALINE HCL 100 MG TABS (SERTRALINE HCL) 1 by mouth once daily  #90 x 3   Entered and Authorized by:   Tresa Garter MD   Signed by:   Tresa Garter MD on 05/10/2007   Method used:   Print then Give to Patient   RxID:   [id number] ATENOLOL 100 MG  TABS (ATENOLOL) 1 by mouth two times a day  #180 x 3   Entered and Authorized by:   Tresa Garter MD   Signed by:   Tresa Garter MD on 05/10/2007   Method used:   Print then Give to Patient   RxID:   [id number] LEVOXYL 150 MCG  TABS (LEVOTHYROXINE SODIUM) 1 by mouth qq  #90 x 3   Entered and Authorized by:   Tresa Garter MD   Signed by:   Tresa Garter MD on 05/10/2007   Method used:   Print then Give to Patient   RxID:   [id number]  ]

## 2010-06-17 NOTE — Consult Note (Signed)
Summary: [facility]   Imported By: Lanelle Bal 04/15/2009 11:42:35  _____________________________________________________________________  External Attachment:    Type:   Image     Comment:   External Document

## 2010-06-17 NOTE — Progress Notes (Signed)
Summary: LETTER  Phone Note Call from Patient   Summary of Call: Patient is requesting a note from Dr Posey Rea stating that she needed to remove the carpet in her house due to medical reasons. OK? SHe says this will help her be able to claim it on her taxes. She was able to "breath better" Initial call taken by: Lamar Sprinkles, CMA,  July 16, 2009 10:01 AM  Follow-up for Phone Call        OK Thx Follow-up by: Tresa Garter MD,  July 16, 2009 1:26 PM  Additional Follow-up for Phone Call Additional follow up Details #1::        Pt informed, letter mailed to pts home address Additional Follow-up by: Lamar Sprinkles, CMA,  July 16, 2009 6:09 PM

## 2010-06-17 NOTE — Assessment & Plan Note (Signed)
Summary: 2 mos f/u #/cd   Vital Signs:  Patient profile:   55 year old female Weight:      302 pounds Temp:     97 degrees F oral Pulse rate:   73 / minute BP sitting:   134 / 80  (left arm)  Vitals Entered By: Tora Perches (May 03, 2009 2:15 PM) CC: f/u Is Patient Diabetic? No   Primary Care Provider:  Dr Posey Rea  CC:  f/u.  History of Present Illness: The patient presents for a follow up of hypertension, diabetes, anxiety   Preventive Screening-Counseling & Management  Alcohol-Tobacco     Smoking Status: never  Current Medications (verified): 1)  Doxazosin Mesylate 4 Mg  Tabs (Doxazosin Mesylate) .[name].. 1 By Mouth Two Times A Day 2)  Atenolol 100 Mg  Tabs (Atenolol) .[name].. 1 By Mouth Two Times A Day 3)  Furosemide 40 Mg  Tabs (Furosemide) .... Once Daily 4)  Klor-Con M20 20 Meq  Tbcr (Potassium Chloride Crys Cr) .... Once Daily 5)  Nasonex 50 Mcg/act  Susp (Mometasone Furoate) .... Two Puffs Each Nostril Daily As Needed 6)  Sertraline Hcl 100 Mg Tabs (Sertraline Hcl) .[name].. 1 By Mouth Once Daily 7)  Vitamin D3 1000 Unit  Tabs (Cholecalciferol) .[name].. 1 Qd 8)  Adult Aspirin Ec Low Strength 81 Mg  Tbec (Aspirin) .[name].. 1 By Mouth Daily 9)  B Complex-B12   Tabs (B Complex Vitamins) .[name].. 1 By Mouth Daily 10)  Fexofenadine Hcl 180 Mg Tabs (Fexofenadine Hcl) .[name].. 1 Once Daily As Needed Allergies 11)  Voltaren 1 %  Gel (Diclofenac Sodium) .... Two Times A Day  To Qid As Needed 12)  Levothyroxine Sodium 175 Mcg  Tabs (Levothyroxine Sodium) .[name].. 1 By Mouth Daily 13)  Cpap .Marland KitchenMarland KitchenMarland Kitchen 15  Cmh2o  Allergies: 1)  ! Diovan (Valsartan) 2)  ! Lotrel (Amlodipine Besy-Benazepril Hcl)  Past History:  Social History: Last updated: 05/01/2008 Patient never smoked.  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    Physical Exam  General:   in no acute distress obese.   Nose:  no deformity, discharge, inflammation, or lesions Mouth:  no deformity or lesions    Lungs:  clear bilaterally to auscultation and percussion Heart:  regular rate and rhythm, S1, S2 without murmurs, rubs, gallops, or clicks Abdomen:  S/NT Ventr hernia to the R and up from umbilicus Msk:  R ankle  Extremities:  No edema B Neurologic:  CN II-XII grossly intact with normal reflexes, coordination, muscle strength and tone Skin:  intact without lesions or rashes Psych:  alert and cooperative; normal mood and affect; normal attention span and concentration   Impression & Recommendations:  Problem # 1:  HYPOTHYROIDISM, UNSPECIFIED (ICD-244.9) Assessment Unchanged  Her updated medication list for this problem includes:    Levothyroxine Sodium 175 Mcg Tabs (Levothyroxine sodium) .[name]... 1 by mouth daily  Problem # 2:  HYPERTENSION (ICD-401.9) Assessment: Improved  Her updated medication list for this problem includes:    Doxazosin Mesylate 4 Mg Tabs (Doxazosin mesylate) .[name]... 1 by mouth two times a day    Atenolol 100 Mg Tabs (Atenolol) .[name]... 1 by mouth two times a day  Furosemide 40 Mg Tabs (Furosemide) ..... Once daily  Problem # 3:  OBESITY, MORBID (ICD-278.01) Assessment: Unchanged She went to see Dr Daphine Deutscher and went to a seminar - not interested in surgery - too scared  Problem # 4:  VITAMIN D DEFICIENCY (ICD-268.9) Assessment: Comment Only  Problem # 5:  Dry skin - hands Assessment: Deteriorated Triamc as needed   Complete Medication List: 1)  Doxazosin Mesylate 4 Mg Tabs (Doxazosin mesylate) .[name].. 1 by mouth two times a day 2)  Atenolol 100 Mg Tabs (Atenolol) .[name].. 1 by mouth two times a day 3)  Furosemide 40 Mg Tabs (Furosemide) .... Once daily 4)  Klor-con M20 20 Meq Tbcr (Potassium chloride crys cr)  .... Once daily 5)  Nasonex 50 Mcg/act Susp (Mometasone furoate) .... Two puffs each nostril daily as needed 6)  Sertraline Hcl 100 Mg Tabs (Sertraline hcl) .[name].. 1 by mouth once daily 7)  Vitamin D3 1000 Unit Tabs (Cholecalciferol) .[name].. 1 qd 8)  Adult Aspirin Ec Low Strength 81 Mg Tbec (Aspirin) .[name].. 1 by mouth daily 9)  B Complex-b12 Tabs (B complex vitamins) .[name].. 1 by mouth daily 10)  Fexofenadine Hcl 180 Mg Tabs (Fexofenadine hcl) .[name].. 1 once daily as needed allergies 11)  Voltaren 1 % Gel (Diclofenac sodium) .... Two times a day  to qid as needed 12)  Levothyroxine Sodium 175 Mcg Tabs (Levothyroxine sodium) .[name].. 1 by mouth daily 13)  Cpap  .Marland KitchenMarland KitchenMarland Kitchen 15  cmh2o 14)  Triamcinolone Acetonide 0.5 % Crea (Triamcinolone acetonide) .... Use two times a day prn  Patient Instructions: 1)  Please schedule a follow-up appointment in 4 months. 2)  BMP prior to visit, ICD-9: 3)  Hepatic Panel prior to visit, ICD-9: 4)  Lipid Panel prior to visit, ICD-9: 5)  TSH prior to visit, ICD-9: 6)  CBC w/ Diff prior to visit, ICD-9: 7)  vit D 268.9  401.1 8)  HbgA1C prior to visit, ICD-9:  Contraindications/Deferment of Procedures/Staging:    Treatment: Flu Shot    Contraindication: N/A  Prescriptions: FEXOFENADINE HCL 180 MG TABS (FEXOFENADINE HCL) 1 once daily as needed allergies  #90 x 3   Entered and Authorized by:   Tresa Garter MD   Signed by:   Tresa Garter MD on 05/03/2009   Method used:   Electronically to        [facility] # [phone]* (retail)       [address]       [place], [place]  [address]       Ph: [phone]       Fax: [phone]   RxID:   [id number] LEVOTHYROXINE SODIUM 175 MCG  TABS (LEVOTHYROXINE SODIUM) 1 by mouth daily  #90 x 3   Entered and Authorized by:   Tresa Garter MD   Signed by:   Tresa Garter MD on 05/03/2009   Method used:   Electronically to        [facility] # [phone]* (retail)       [address]       [place], [place]  [address]       Ph: [phone]       Fax: [phone]   RxID:   [id number] SERTRALINE HCL 100 MG TABS (SERTRALINE HCL) 1 by mouth once daily  #90 x 3   Entered  and Authorized by:   Tresa Garter MD   Signed by:   Tresa Garter MD on 05/03/2009   Method used:   Electronically to        [facility] # [phone]* (retail)       [address]       [place], [place]  [address]       Ph: [phone]       Fax: [phone]   RxID:   [id number] KLOR-CON M20 20 MEQ  TBCR (POTASSIUM CHLORIDE CRYS CR) once daily  #90 x 3   Entered and Authorized by:   Tresa Garter MD   Signed by:   Tresa Garter MD on 05/03/2009   Method used:   Electronically to        [facility] # [phone]* (retail)       [address]       [place], [place]  [address]       Ph: [phone]       Fax: [phone]   RxID:   [id number] FUROSEMIDE 40 MG  TABS (FUROSEMIDE) once daily  #90 x 3   Entered and Authorized by:   Tresa Garter MD   Signed by:   Tresa Garter MD on 05/03/2009   Method used:   Electronically to        [facility] # [phone]* (retail)       [address]        [place], [place]  [address]       Ph: [phone]       Fax: [phone]   RxID:   [id number] ATENOLOL 100 MG  TABS (ATENOLOL) 1 by mouth two times a day  #180 x 3   Entered and Authorized by:   Tresa Garter MD   Signed by:   Tresa Garter MD on 05/03/2009   Method used:   Electronically to        [facility] # [phone]* (retail)       [address]       [place], [place]  [address]       Ph: [phone]       Fax: [phone]   RxID:   [id number] DOXAZOSIN MESYLATE 4 MG  TABS (DOXAZOSIN MESYLATE) 1 by mouth two times a day  #180 x 3   Entered and Authorized by:   Tresa Garter MD   Signed by:   Tresa Garter MD on 05/03/2009   Method used:   Electronically to        [facility] # [phone]* (retail)       [address]       [place], [place]  [address]       Ph: [phone]       Fax: [phone]   RxID:   [id number] TRIAMCINOLONE ACETONIDE 0.5 % CREA (TRIAMCINOLONE ACETONIDE) use two times a day prn  #120 g x 3   Entered and Authorized by:   Tresa Garter MD   Signed by:   Tresa Garter MD on 05/03/2009   Method used:  Electronically to        [facility] # [phone]* (retail)       [address]       [place], [place]  [address]       Ph: [phone]       Fax: [phone]   RxID:   [id number]

## 2010-06-17 NOTE — Letter (Signed)
Summary: Generic Letter  [facility]  [address] [place], [place] [address]   Phone: [phone]  Fax: [phone]    07/22/2009  [facility] [address] [place], [place]  [address]  Dear Tracy Sampson,   The above named patient was advised to remove the carpet from her home due to medical reasons in 2010.         Sincerely,   Lamar Sprinkles, CMA (AAMA) for Dr Sonda Primes

## 2010-06-17 NOTE — Progress Notes (Signed)
Summary: Sleep MD rec from Sleep study  ---- Converted from flag ---- ---- 05/02/2008 9:01 PM, [place]. Vassie Loll MD wrote: Tracy Sampson, CPAP 15 seems adequate based on her rpt titration (even though she has gained 40-50 Lbs since last study ! You may have to look at other causes of 'choking episodes such as mask leak -which may cause loss of pressure. This would be evident on a download. Thx, Rakesh ------------------------------

## 2010-06-19 NOTE — Assessment & Plan Note (Signed)
Summary: FU Tracy Sampson  #   Vital Signs:  Patient profile:   55 year old female Height:      66 inches Weight:      313 pounds BMI:     50.70 Temp:     98.5 degrees F oral Pulse rate:   72 / minute Pulse rhythm:   regular Resp:     16 per minute BP sitting:   148 / 98  (left arm) Cuff size:   large  Vitals Entered By: Lanier Prude, Beverly Gust) (April 24, 2010 9:14 AM) CC: f/u  Is Patient Diabetic? No CBG Result 97   Primary Care Provider:  Dr Posey Rea  CC:  f/u .  History of Present Illness: The patient presents for a follow up of hypertension C/o frequent urination x months   Current Medications (verified): 1)  Doxazosin Mesylate 4 Mg  Tabs (Doxazosin Mesylate) .[name].. 1 By Mouth Two Times A Day 2)  Atenolol 100 Mg  Tabs (Atenolol) .[name].. 1 By Mouth Two Times A Day 3)  Furosemide 40 Mg  Tabs (Furosemide) .... Once Daily 4)  Klor-Con M20 20 Meq  Tbcr (Potassium Chloride Crys Cr) .... Once Daily 5)  Nasonex 50 Mcg/act  Susp (Mometasone Furoate) .... Two Puffs Each Nostril Daily As Needed 6)  Sertraline Hcl 100 Mg Tabs (Sertraline Hcl) .[name].. 1 By Mouth Once Daily 7)  Vitamin D3 1000 Unit  Tabs (Cholecalciferol) .[name].. 1 Qd 8)  Adult Aspirin Ec Low Strength 81 Mg  Tbec (Aspirin) .[name].. 1 By Mouth Daily 9)  B Complex-B12   Tabs (B Complex Vitamins) .[name].. 1 By Mouth Daily 10)  Fexofenadine Hcl 180 Mg Tabs (Fexofenadine Hcl) .[name].. 1 Once Daily As Needed Allergies 11)  Voltaren 1 %  Gel (Diclofenac Sodium) .... Two Times A Day  To Qid As Needed 12)  Levothyroxine Sodium 175 Mcg  Tabs (Levothyroxine Sodium) .[name].. 1 By Mouth Daily 13)  Cpap .Marland KitchenMarland KitchenMarland Kitchen 15  Cmh2o 14)  Triamcinolone Acetonide 0.5 % Crea (Triamcinolone Acetonide) .... Use Two Times A Day Prn  Allergies (verified): 1)  ! Diovan (Valsartan) 2)  ! Lotrel (Amlodipine Besy-Benazepril Hcl)  Family History: Reviewed history from 06/27/2008 and no changes required. Family History Hypertension mother-breast CA Mat grandmother-colon  CA  Social History: Reviewed history from 05/01/2008 and no changes required. Patient never smoked.  Occupation: lost her job in [employer] w/AE Single  Review of Systems       The patient complains of weight gain and dyspnea on exertion.  The patient denies fever, chest pain, and abdominal pain.    Physical Exam  General:   in no acute distress obese.   Head:  normocephalic and atraumatic Nose:  no deformity, discharge, inflammation, or lesions Mouth:  no deformity or lesions    Neck:  no masses, thyromegaly, or abnormal cervical nodes Lungs:  clear bilaterally to auscultation and percussion Heart:  regular rate and rhythm, S1, S2 without murmurs, rubs, gallops, or clicks Abdomen:  S/NT Ventr hernia to the R and up from umbilicus Extremities:  no edema Skin:  intact without lesions or rashes   Impression & Recommendations:  Problem # 1:  HYPOTHYROIDISM, UNSPECIFIED (ICD-244.9) Assessment Comment Only  Her updated medication list for this problem includes:    Levothyroxine Sodium 175 Mcg Tabs (Levothyroxine sodium) .[name]... 1 by mouth daily  Problem # 2:  HYPERTENSION (ICD-401.9) Assessment: Unchanged  Risks of noncompliance with treatment discussed. Compliance encouraged.  Her updated medication list for this problem  includes:    Doxazosin Mesylate 4 Mg Tabs (Doxazosin mesylate) .[name]... 1 by mouth two times a day    Atenolol 100 Mg Tabs (Atenolol) .[name]... 1 by mouth two times a day    Furosemide 40 Mg Tabs (Furosemide) ..... Once daily  Orders: TLB-Udip ONLY (81003-UDIP)  Problem # 3:  OBESITY, MORBID (ICD-278.01) Assessment: Deteriorated Diet discussed Ht: 66 (04/24/2010)   Wt: 313 (04/24/2010)   BMI: 50.70 (04/24/2010)  Complete Medication List: 1)  Doxazosin Mesylate 4 Mg Tabs (Doxazosin mesylate) .[name].. 1 by mouth two times a day 2)  Atenolol 100 Mg Tabs (Atenolol) .[name].. 1 by mouth two times a day 3)  Furosemide 40 Mg Tabs (Furosemide) .... Once daily 4)   Klor-con M20 20 Meq Tbcr (Potassium chloride crys cr) .... Once daily 5)  Nasonex 50 Mcg/act Susp (Mometasone furoate) .... Two puffs each nostril daily as needed 6)  Sertraline Hcl 100 Mg Tabs (Sertraline hcl) .[name].. 1 by mouth once daily 7)  Vitamin D3 1000 Unit Tabs (Cholecalciferol) .[name].. 1 qd 8)  Adult Aspirin Ec Low Strength 81 Mg Tbec (Aspirin) .[name].. 1 by mouth daily 9)  B Complex-b12 Tabs (B complex vitamins) .[name].. 1 by mouth daily 10)  Fexofenadine Hcl 180 Mg Tabs (Fexofenadine hcl) .[name].. 1 once daily as needed allergies 11)  Voltaren 1 % Gel (Diclofenac sodium) .... Two times a day  to qid as needed 12)  Levothyroxine Sodium 175 Mcg Tabs (Levothyroxine sodium) .[name].. 1 by mouth daily 13)  Cpap  .Marland KitchenMarland KitchenMarland Kitchen 15  cmh2o 14)  Triamcinolone Acetonide 0.5 % Crea (Triamcinolone acetonide) .... Use two times a day prn  Patient Instructions: 1)  Please schedule a follow-up appointment in 6 months. 2)  Call if problems Prescriptions: LEVOTHYROXINE SODIUM 175 MCG  TABS (LEVOTHYROXINE SODIUM) 1 by mouth daily  #90 x 3   Entered and Authorized by:   Tresa Garter MD   Signed by:   Tresa Garter MD on 04/24/2010   Method used:   Print then Give to Patient   RxID:   [id number] FEXOFENADINE HCL 180 MG TABS (FEXOFENADINE HCL) 1 once daily as needed allergies  #90 x 3   Entered and Authorized by:   Tresa Garter MD   Signed by:   Tresa Garter MD on 04/24/2010   Method used:   Print then Give to Patient   RxID:   [id number] VITAMIN D3 1000 UNIT  TABS (CHOLECALCIFEROL) 1 qd  #90 x 3   Entered and Authorized by:   Tresa Garter MD   Signed by:   Tresa Garter MD on 04/24/2010   Method used:   Print then Give to Patient   RxID:   [id number] SERTRALINE HCL 100 MG TABS (SERTRALINE HCL) 1 by mouth once daily  #90 x 3   Entered and Authorized by:   Tresa Garter MD   Signed by:   Tresa Garter MD on 04/24/2010   Method used:   Print then Give to  Patient   RxID:   [id number] KLOR-CON M20 20 MEQ  TBCR (POTASSIUM CHLORIDE CRYS CR) once daily  #90 x 3   Entered and Authorized by:   Tresa Garter MD   Signed by:   Tresa Garter MD on 04/24/2010   Method used:   Print then Give to Patient   RxID:   [id number] FUROSEMIDE 40 MG  TABS (FUROSEMIDE) once daily  #90 x 3   Entered and Authorized  by:   Tresa Garter MD   Signed by:   Tresa Garter MD on 04/24/2010   Method used:   Print then Give to Patient   RxID:   [id number] ATENOLOL 100 MG  TABS (ATENOLOL) 1 by mouth two times a day  #180 x 3   Entered and Authorized by:   Tresa Garter MD   Signed by:   Tresa Garter MD on 04/24/2010   Method used:   Print then Give to Patient   RxID:   [id number] DOXAZOSIN MESYLATE 4 MG  TABS (DOXAZOSIN MESYLATE) 1 by mouth two times a day  #180 x 3   Entered and Authorized by:   Tresa Garter MD   Signed by:   Tresa Garter MD on 04/24/2010   Method used:   Print then Give to Patient   RxID:   [id number]    Orders Added: 1)  Est. Patient Level II [53664] 2)  TLB-Udip ONLY [81003-UDIP]

## 2010-08-12 ENCOUNTER — Encounter: Payer: Self-pay | Admitting: Pulmonary Disease

## 2010-08-15 ENCOUNTER — Ambulatory Visit (INDEPENDENT_AMBULATORY_CARE_PROVIDER_SITE_OTHER): Payer: Self-pay | Admitting: Pulmonary Disease

## 2010-08-15 ENCOUNTER — Ambulatory Visit: Payer: Self-pay | Admitting: Pulmonary Disease

## 2010-08-15 ENCOUNTER — Encounter: Payer: Self-pay | Admitting: Pulmonary Disease

## 2010-08-15 DIAGNOSIS — I10 Essential (primary) hypertension: Secondary | ICD-10-CM

## 2010-08-15 DIAGNOSIS — G4730 Sleep apnea, unspecified: Secondary | ICD-10-CM

## 2010-08-15 MED ORDER — POTASSIUM CHLORIDE CRYS ER 20 MEQ PO TBCR: 20.0000 meq | EXTENDED_RELEASE_TABLET | Freq: Every day | ORAL | Status: DC

## 2010-08-15 MED ORDER — FUROSEMIDE 40 MG PO TABS: 40.0000 mg | ORAL_TABLET | Freq: Every day | ORAL | Status: DC

## 2010-08-15 NOTE — Assessment & Plan Note (Signed)
Ct autoCPAP - avg pr 15 cm  Weight loss encouraged, compliance with goal of at least 4-6 hrs every night is the expectation. Advised against medications with sedative side effects Cautioned against driving when sleepy - understanding that sleepiness will vary on a day to day basis  

## 2010-08-15 NOTE — Assessment & Plan Note (Signed)
Unclear why her bP is running high lately Given few days of lasix + Kcl for pedal edema She will call Dr Posey Rea if Bp remains high inspite of this treatment

## 2010-08-15 NOTE — Progress Notes (Signed)
  Subjective:    Patient ID: Tracy Sampson, female    DOB: 05-26-1955, 55 y.o.   MRN: [id number]  HPI 52/F, obese for FU of obstructive sleep apnea diagnosed in 4/03 on autoCPAP.  Weight gain of 58 lbs since study, rpt titration 12/09 showed adequate control of events with CPAP 15 cm, now on autoCPAP. Breakthrough gasping events have decreased-she describes one such event in church when awake. Feels presure is high sometimes when she has an arousal.   April 24, 2009  reviewed downlaod 3/10 >> avg pressure 13 cm, study showed 15 cm >> ct autoCPAP  Epworth Sleepiness Score 5/24. wt unchanged, no snoring.Mask ok pressure ok.  08/15/2010 Annual visit Gained 10 lbs since last visit BP high this week, pedal edema Denies excessive daytime somnolence      Review of Systems Pt denies any significant  nasal congestion or excess secretions, fever, chills, sweats, unintended wt loss, pleuritic or exertional cp, orthopnea pnd or leg swelling.  Pt also denies any obvious fluctuation in symptoms with weather or environmental change or other alleviating or aggravating factors.    Pt denies any increase in rescue therapy over baseline, denies waking up needing it or having early am exacerbations or coughing/wheezing/ or dyspnea       Objective:   Physical Exam Gen. Pleasant, well-nourished, in no distress ENT - no lesions, no post nasal drip Neck: No JVD, no thyromegaly, no carotid bruits Lungs: no use of accessory muscles, no dullness to percussion, clear without rales or rhonchi  Cardiovascular: Rhythm regular, heart sounds  normal, no murmurs or gallops, 1+ peripheral edema Musculoskeletal: No deformities, no cyanosis or clubbing          Assessment & Plan:

## 2010-08-15 NOTE — Patient Instructions (Signed)
Take 40 mg lasix daily x 5 days with potassium supplement Stay on your CPAP machine

## 2010-09-12 ENCOUNTER — Ambulatory Visit: Payer: Self-pay | Admitting: Pulmonary Disease

## 2010-09-19 ENCOUNTER — Telehealth: Payer: Self-pay | Admitting: Internal Medicine

## 2010-09-19 NOTE — Telephone Encounter (Signed)
Pt states she has an upcoming appointment and would like for you to order labs for her to get prior to coming in for OV.

## 2010-09-24 NOTE — Telephone Encounter (Signed)
What labs would you like entered?

## 2010-10-03 NOTE — Op Note (Signed)
NAMEOSCEOLA, HOLIAN             ACCOUNT NO.:  [id number]   MEDICAL RECORD NO.:  [id number]          PATIENT TYPE:  INP   LOCATION:  1604                         FACILITY:  [facility]   PHYSICIAN:  Gretta Cool, M.D. DATE OF BIRTH:  1955/11/11   DATE OF PROCEDURE:  02/01/2006  DATE OF DISCHARGE:  02/03/2006                                 OPERATIVE REPORT   PREOPERATIVE DIAGNOSES:  Uterine leiomyomata with abnormal uterine bleeding,  incontinence of urine and pelvic pain.   POSTOPERATIVE DIAGNOSES:  Uterine leiomyomata with abnormal uterine  bleeding, incontinence of urine and pelvic pain.   PROCEDURE:  Supracervical hysterectomy, bilateral salpingo-oophorectomy.   SURGEON:  Beather Arbour.   ASSISTANT:  Almedia Balls. Randell Patient, M.D.   ANESTHESIA:  General orotracheal anesthesia.   DESCRIPTION OF PROCEDURE:  Under general anesthesia with the patient prepped  and draped, in the supine position with Foley catheter draining the bladder,  a vertical skin incision was made from her umbilicus to her lower abdomen,  with careful attention to avoid the fold of the panniculus and a fold of  skin, to avoid anaerobic bacterial contamination..  The incision was  extended through the enormous abdominal panniculus.  Note that the patient's  BMI is approximately 50.  The incision was extended through the fascia.  The  fascia was then opened vertically and the peritoneum exposed.  The  peritoneum was then opened and the abdomen explored.  No abnormalities  identified in  the upper abdomen.  Examination of the pelvis revealed an  enormous fibroid in the cul-de-sac, pushing the uterus anteriorly.  There  were several other fibroids bulging and distorting the endometria of the  uterine cavity.  There was also a large fibroid subserosal pedunculated,  bulging from the right lateral fundal wall.  There were adhesions of the  fallopian tubes to the omentum on both sides.  No other evidence of pelvic  inflammatory process.  Both ovaries appeared normal as did the tubes.  The  retractors were placed in the peritoneal cavity and the uterus mobilized as  much as possible.  The infundibulopelvic vessels were then clamped, cut,  sutured and tied with #0-Vicryl.  A second free tie was used to doubly  ligate the infundibulum pelvic.  The posterior myoma was so enormous that  visualization was very difficult.  The ascending branches of the uterine  vessels were clamped with Masterson clamps.  At this point the enormous  fibroid on the posterior wall was enucleated, and the uterine wall pulled  together with a series of Lahey  thyroid clamps to control the bleeding.  Once the uterine vessels could be adequately visualized, the anterior leaf  of the broad ligament was opened and the bladder pushed off the lower  segment.  The uterine vessels were then clamped, cut, sutured and tied with  #0 Vicryl.  At this point the fundus of the uterus was excised to allow  adequate visualization.  The uterine vessels were then sutured and the  remaining stump of cervix grasped with Lahey thyroid clamps.  A  straight  Masterson clamp was then used  to clamp the upper portion of the Cardinal  ligament.  The ligament was then sutured with #0 Vicryl and the remaining  cervix cored out, so as to remove the endocervical canal, but very little of  the the cervical tissue.  At this point, with the bleeding well-controlled,  the cervix was closed with a mattress closure of #0-Vicryl from the right  angle to the left.  The remainder of the Cardinal uterosacral complex  support was secured to the cervix as well.  At this point the peritoneum was  secured over the pedicles with a running suture of #2-0 Monocryl.  Pelvis  was then irrigated to remove clotted debris.  At this point the packs and  retractors were removed.  The abdominal peritoneum was closed with a running  suture of #0 Monocryl.  The rectus fascia was then  closed with a running  suture of #0- Vicryl.  The rectus fascia was then approximated with a  running suture from each angle to the midline of #1 PDS.  A running mattress-  type closure was used.  At this point the enormous panniculus was closed  with interrupted sutures in two layers of #3-0 Vicryl.  The skin was then  closed with skin staples and Steri-Strips.  An abdominal binder was placed  on the incision to help reduce the lateral tension because of her enormous  panniculus with a BMI greater than 50.           ______________________________  Gretta Cool, M.D.     CWL/MEDQ  D:  02/04/2006  T:  02/05/2006  Job:  [id number]   cc:   Almedia Balls. Randell Patient, M.D.  Fax: [phone]   Georgina Quint. Plotnikov, MD  [address]  [place]  [place] [address]   Charlcie Cradle. Delford Field, MD, FCCP  [address]  [place]  [place] [address]

## 2010-10-03 NOTE — Assessment & Plan Note (Signed)
[facility]                               PULMONARY OFFICE NOTE   NAME:Tracy Sampson, Tracy Sampson                    MRN:          [id number]  DATE:02/24/2006                            DOB:          Nov 30, 1955    Ms. Cotten is a 55 year old African-American female, history of severe  obstructive sleep apnea.  The patient currently having no complaints.  She  is on 16 cm of CPAP, doing well with a full face mask.  She gets 7-8 hours  of sleep at night without difficulty.  She is having no significant  hypersomnolence.  Her CPAP machine, though, is not functioning properly and  is needing a replacement.  She has lost 10 pounds in weight.   EXAMINATION:  VITAL SIGNS:  Temperature 97, blood pressure 120/76, pulse 70,  saturation 97% on room air.  CHEST:  Showed to be clear.  There was no evidence of any wheeze, rale or  rhonchi.  CARDIAC:  Showed a regular rate and rhythm without S3, normal S1 and S2.  ABDOMEN:  Soft, nontender, protuberant.  EXTREMITIES:  Showed no edema or clubbing.  SKIN:  Clear.  NEUROLOGIC:  Intact.  HEENT:  Showed no jugular venous distention, no lymphadenopathy.  Oropharynx  clear, neck supple.   IMPRESSION:  That of severe obstructive sleep apnea with positive response  to CPAP therapy.   PLAN:  This patient is to maintain CPAP at 16 cm of water pressure.  We  will, however, try to attempt for the patient a new CPAP machine.  Otherwise, we will see this patient back in return followup in 12 months.       Charlcie Cradle Delford Field, MD, FCCP      PEW/MedQ  DD:  02/24/2006  DT:  02/26/2006  Job #:  [id number]   cc:   Georgina Quint. Plotnikov, MD

## 2010-10-03 NOTE — H&P (Signed)
NAME:  Tracy Sampson, Tracy Sampson             ACCOUNT NO.:  [id number]   MEDICAL RECORD NO.:  [id number]          PATIENT TYPE:  AMB   LOCATION:  DAY                          FACILITY:  [facility]   PHYSICIAN:  Gretta Cool, M.D. DATE OF BIRTH:  [birth year]   DATE OF ADMISSION:  DATE OF DISCHARGE:                                HISTORY & PHYSICAL   CHIEF COMPLAINT:  1. Rapidly enlarging abdominopelvic mass.  2. Pelvic pressure.  3. Extreme nocturia.   HISTORY OF PRESENT ILLNESS:  Ms. Tracy Sampson presented for evaluation of an  abdominopelvic mass.  She had a CT scan that revealed enormous uterine mass  extending from the pelvis to the level of the umbilicus.  She had previously  had known uterine fibroids, but they have grown rather massively over the  last year.  She is now admitted for definitive therapy by supracervical  hysterectomy and bilateral salpingo-oophorectomy.   She has severe nocturia, up several times at night to void.  She also  has  pelvic pressure and difficulty evacuating her bowel. Her menstrual periods  are very heavy on the first couple of days and taper over a longer period.   PAST MEDICAL HISTORY:  1. Usual childhood disease without sequelae.  2. The patient has fairly severe high blood pressure.  She is on multi-      agent therapy for control.  She was initially scheduled a couple of      months ago but was rescheduled because of blood pressure out of      control.  She now uses Norvasc, atenolol 100 mg b.i.d., enalapril 20 mg      b.i.d., doxazosin 4 mg b.i.d. and Lasix 40 mg daily for control of her      blood pressure.  3. She also has hypothyroidism and is on thyroid replacement.   PREVIOUS SURGERY:  1. Breast reduction in 1996.  2. Breast biopsy in 1995.   FAMILY HISTORY:  Father died of heart attack, unknown age.  Mother is 52,  had breast cancer, died 5 years after diagnosis.  One brother living and  well except blood pressure elevation.  Other cancer  risks: A maternal aunt  had throat cancer.  Maternal grandmother had colon cancer.   PRESENT MEDICATIONS:  1. Norvasc, unknown dose.  2. Atenolol 100 mg b.i.d.  3. Enalapril 20 g b.i.d.  4. Doxazosin 40 mg b.i.d.  5. Furosemide 40 mg daily.  6. Synthroid 0.088 mg daily.   HABITS:  Denies ethanol or tobacco.   SOCIAL HISTORY:  The patient is single, is employed by [employer] as a  Museum/gallery conservator.   REVIEW OF SYSTEMS:  HEENT:  Denies symptoms.  CARDIORESPIRATORY: Denies  cough, asthma, bronchitis, shortness of breath.  Severe hypertension as  above.  GI/GU: Denies symptoms.   PHYSICAL EXAMINATION:  GENERAL:  Well-developed but massively obese white  female with BMI approximately 50.  Her weight is 288 at 5 feet 6 inches.  HEENT: Pupils equal, round, and reactive to light and accommodation.  Fundi  not examined. Oropharynx clear.  NECK:  Supple without mass  or thyroid enlargement.  CHEST: Clear to percussion and auscultation.  BREASTS:  Bilateral reduction mammoplasty without mass or axillary  adenopathy.  ABDOMEN:  Massive panniculus with uterus palpable to the umbilicus and no  other organomegaly.  PELVIC: External genitalia normal female.  Vagina clean, rugose. Cervix is  full depth out of the pelvis, nulliparous in appearance.  Uterus is massive  in size, rises to the umbilicus.  Adnexa clear.  Rectovaginal confirms.  EXTREMITIES:  Negative.  NEUROLOGIC: Physiologic.   IMPRESSION:  1. Rapidly enlarging uterine leiomyomata with abnormal uterine bleeding,      pelvic pressure, and bladder and bowel complaints.  2. Severe hypertension requiring multi-agent therapy, rather labile.  3. Hypothyroidism on replacement.  4. Morbid obesity, extreme.   I have discussed the risks, benefits of the procedure, options and  alternatives all.  Also discussed higher risk of hernia formation,  infection, hemorrhage, thromboembolic disease, and all of the possible  consequences  because of her extreme morbid obesity.  She understands the  risks and alternatives all.  She wishes to proceed to oophorectomy because  of her age.           ______________________________  Gretta Cool, M.D.     CWL/MEDQ  D:  01/31/2006  T:  01/31/2006  Job:  [id number]   cc:   Georgina Quint. Plotnikov, MD  [address]  [place]  [place] [address]   Charlcie Cradle. Delford Field, MD, FCCP  [address]  [place]  [place] [address]

## 2010-10-03 NOTE — Procedures (Signed)
Tracy Sampson, Tracy Sampson             ACCOUNT NO.:  [id number]   MEDICAL RECORD NO.:  [id number]          PATIENT TYPE:  OUT   LOCATION:  SLEEP CENTER                 FACILITY:  [facility]   PHYSICIAN:  Oretha Milch, MD      DATE OF BIRTH:  1956/03/25   DATE OF STUDY:                            NOCTURNAL POLYSOMNOGRAM   REFERRING PHYSICIAN:  Charlcie Cradle. Delford Field, MD, [facility]   REFERRING PHYSICIAN:  Charlcie Cradle. Delford Field, MD, FCCP   INDICATION FOR THE STUDY:  Tracy Sampson is a 55 year old woman with  obstructive sleep apnea, maintained on CPAP of 16 cm with weight gain  since our last study, with weight gain 58 pounds since our last study.  Reporting choking episodes in spite of CPAP.  Our CPAP titration study  in 4/03 at shown adequate control of events with 16 cm, she weighs 240  then.  At the time of the current study, she weighted 298 with height of  66 inches, and BMI of 48. Neck size 16 inches.   EPWORTH SLEEPINESS SCORE:  1.   This CPAP titration study was performed with a sleep technologist in  attendance.  EEG, EOG, EMG, EKG, and respiratory parameters were  recorded.  Sleep stages, arousals, limb movements, and respiratory data  were scored according to criteria laid out by the American Academy of  Sleep Medicine.   SLEEP ARCHITECTURE:  Lights-out was at 11:23 p.m. and lights-on was at  5:43 a.m.  Total sleep time was 293 minutes with a sleep period time of  341 minutes and sleep efficiency of 77%.  Sleep latency was 37.5  minutes.  Latency to REM sleep was 224 minutes.  Longest REM period was  noted around 4 a.m.  Sleep stages as a percentage of total sleep time  was N1 6%, N2 84.3%, and N3 0%, and REM sleep 9.7% (28.5 minutes).  She  has spent this time in supine REM sleep.   RESPIRATORY DATA:  CPAP was initiated at 5 cm and titrated due to  respiratory events and snoring to level of 15 cm.  At the level of 9 cm,  for 18 minutes of non-REM sleep, 9 hypopneas were noted with a  lowest  desaturation of 91%.  CPAP was increased further due to snoring at a  level of 15 cm for 143.5 minutes including 28.5 minutes of REM sleep, 10  hypopneas were noted, AHI of 4.2 events per hour, and lowest  desaturation of 91%.  This appears to be the optimal level used during  this study.   OXYGEN SATURATION DATA:  The lowest desaturation was 91%.   LIMB MOVEMENT DATA:  No significant limb movements were noted.   CARDIAC DATA:  The lowest heart rate was 49 beats per minute during non-  REM sleep.  The high heart rate recorded was already factual.   DISCUSSION:  A full face large size mask was used.  CPAP was titrated to  higher level predominantly due to snoring rather than respiratory event.   IMPRESSION:  1. Sleep related respiratory disturbance was corrected by continuous      positive airway pressure of 15 cm with a  large full face mask.  2. No evidence of cardiac arrhythmias,  limb movements, or behavioral      disturbance during sleep.  3. Weight gain of 58 pounds since our titration study in 4/03.   RECOMMENDATIONS:  1. In spite of her weight gain, her current CPAP level of 15-16 cm      seems to be adequate.  She seems to tolerate this level hence BiPAP      does not seem to be indicated.  2. If her some symptoms of choking persistent at this level, I would      obtain a CPAP download and look for a mask leak.  Note that she was      desensitized with a large full face mask during this particular      study.  3. She should be asked to avoid medications with sedative side effects      and cautioned against driving when sleepy.      Oretha Milch, MD  Electronically Signed     RVA/MEDQ  D:  05/02/2008 17:11:04  T:  05/03/2008 08:00:14  Job:  [id number]   cc:   Tracy Cradle. Delford Field, MD, FCCP  [address]  [place]  [place] [address]

## 2010-10-03 NOTE — Discharge Summary (Signed)
NAMEDELAYLA, Tracy Sampson             ACCOUNT NO.:  [id number]   MEDICAL RECORD NO.:  [id number]          PATIENT TYPE:  INP   LOCATION:  1604                         FACILITY:  [facility]   PHYSICIAN:  Gretta Cool, M.D. DATE OF BIRTH:  1956/01/08   DATE OF ADMISSION:  02/02/2006  DATE OF DISCHARGE:  02/03/2006                                 DISCHARGE SUMMARY   HISTORY OF PRESENT ILLNESS:  Mrs. Tracy Sampson is a 55 year old female who  presented to the office for evaluation of an abdominal pelvic mass.  She had  a CT scan that revealed an enormous uterine mass extending from the pelvis  to the umbilicus.  She has had a history of uterine fibroids, and upon  evaluation, noted that they have grown rather massively over the last year.  She is now admitted for definitive therapy by supracervical hysterectomy and  bilateral salpingo-oophorectomy.  It is noted that she reports severe  nocturia, pelvic pressure and difficulty evacuating her bowels.  Her  menstrual cycles are very heavy on the first couple of days and taper for  over a longer period.  It is also noted that she has severe high blood  pressure on multiple agent therapy for control.  She also has hypothyroidism  and is on thyroid replacement.   ADMISSION PHYSICAL EXAMINATION:  CHEST:  Clear to A&P.  HEART:  Rate and rhythm were regular without murmurs, gallops, cardiac  enlargement.  ABDOMEN:  Massive panniculus with uterus palpable to the umbilicus.  No  other organomegaly noted.  PELVIC:  External genitalis within normal limits for female.  Vagina is  clean and rugose.  Cervix is full depth out of the pelvis.  Nulliparous in  appearance.  Uterus is massive in size, rising to the umbilicus.  Adnexa  bilaterally clear.  Rectovaginal exam confirms.   IMPRESSION:  1. Rapidly enlarging uterine leiomyomata with abnormal uterine bleeding,      pelvic pressure and bladder and bowel complaints.  2. Severe hypertension requiring  multi-agent therapy.  3. Hypothyroid, on replacement.  4. Morbid obesity.   PLAN:  Risks and benefits have been discussed with the patient, and she  accepts the procedures.  She wishes to proceed with oophorectomy because of  her age.   ADMISSION LABORATORY DATA:  Hemoglobin 12.1, hematocrit 36.7.  On postop day  #1, hemoglobin was 10.7, hematocrit 31.8.  Remainder of her preoperative lab  work was within normal limits, and her urine pregnancy test was negative.  ECG showed normal sinus rhythm, nonspecific T wave abnormality.  Chest x-  ray:  No evidence of acute cardiac or pulmonary process.   HOSPITAL COURSE:  The patient underwent supracervical hysterectomy,  bilateral salpingo-oophorectomy under general anesthesia.  There were  adhesions on the fallopian tubes to the omentum on both sides.  There was an  enormous fibroid in the cul-de-sac pushing the uterus anteriorly and several  other fibroids bulging and distorting the endometria of the uterine cavity.  There was also a large fibroid subserosal pedunculated.  Procedures were  completed without any complications, and the patient was returned to the  recovery room in excellent condition.  The abdominal binder was placed on  the incision to reduce lateral tension due to the enormous panniculus.  It  is noted that she has a BMI of greater than 50.  Her pathology report:  Uterus, bilateral fallopian tubes and ovaries, hysterectomy, bilateral  salpingo-oophorectomy, endometrial with secretory phase, myometrium  leiomyomata, ovaries right and left no significant abnormalities and  fallopian tubes right and left no significant abnormalities.  Her  postoperative course was without complications and she was discharged on  postop day #2 in excellent condition.   FINAL DISCHARGE INSTRUCTIONS:  1. No heavy lifting or straining.  2. No vaginal entrance.  3. Increase ambulation as tolerated.  4. She is to call if any fever over 100.5 or  failure of daily improvement.  5. She is to return to the office on Friday, September 21, for follow up.   MEDICATIONS:  1. Percocet 5/325 one p.o. q.4-6 h p.r.n. discomfort.  2. Resume preoperative medications.   FINAL DISCHARGE DIAGNOSES:  1. Uterine leiomyomata with abnormal uterine bleeding.  2. Incontinence of stool.  3. Pelvic pain.   PROCEDURES PERFORMED:  Supracervical hysterectomy, bilateral salpingo-  oophorectomy under general anesthesia.      Tracy Sampson, N.P.    ______________________________  Gretta Cool, M.D.    EMK/MEDQ  D:  02/17/2006  T:  02/18/2006  Job:  [id number]

## 2010-10-22 ENCOUNTER — Encounter: Payer: Self-pay | Admitting: Internal Medicine

## 2010-10-23 ENCOUNTER — Ambulatory Visit (INDEPENDENT_AMBULATORY_CARE_PROVIDER_SITE_OTHER): Payer: Self-pay | Admitting: Internal Medicine

## 2010-10-23 ENCOUNTER — Other Ambulatory Visit (INDEPENDENT_AMBULATORY_CARE_PROVIDER_SITE_OTHER): Payer: Self-pay

## 2010-10-23 ENCOUNTER — Other Ambulatory Visit: Payer: Self-pay | Admitting: Internal Medicine

## 2010-10-23 ENCOUNTER — Encounter: Payer: Self-pay | Admitting: Internal Medicine

## 2010-10-23 DIAGNOSIS — R202 Paresthesia of skin: Secondary | ICD-10-CM

## 2010-10-23 DIAGNOSIS — E039 Hypothyroidism, unspecified: Secondary | ICD-10-CM

## 2010-10-23 DIAGNOSIS — R209 Unspecified disturbances of skin sensation: Secondary | ICD-10-CM

## 2010-10-23 DIAGNOSIS — G4730 Sleep apnea, unspecified: Secondary | ICD-10-CM

## 2010-10-23 DIAGNOSIS — I10 Essential (primary) hypertension: Secondary | ICD-10-CM

## 2010-10-23 DIAGNOSIS — E559 Vitamin D deficiency, unspecified: Secondary | ICD-10-CM

## 2010-10-23 LAB — CBC WITH DIFFERENTIAL/PLATELET
Basophils Absolute: 0 10*3/uL (ref 0.0–0.1)
Basophils Relative: 0.5 % (ref 0.0–3.0)
Eosinophils Absolute: 0.3 10*3/uL (ref 0.0–0.7)
Eosinophils Relative: 4.7 % (ref 0.0–5.0)
HCT: 38.8 % (ref 36.0–46.0)
Hemoglobin: 12.9 g/dL (ref 12.0–15.0)
Lymphocytes Relative: 31.1 % (ref 12.0–46.0)
Lymphs Abs: 1.7 10*3/uL (ref 0.7–4.0)
MCHC: 33.1 g/dL (ref 30.0–36.0)
MCV: 93.6 fl (ref 78.0–100.0)
Monocytes Absolute: 0.4 10*3/uL (ref 0.1–1.0)
Monocytes Relative: 6.9 % (ref 3.0–12.0)
Neutro Abs: 3.2 10*3/uL (ref 1.4–7.7)
Neutrophils Relative %: 56.8 % (ref 43.0–77.0)
Platelets: 209 10*3/uL (ref 150.0–400.0)
RBC: 4.15 Mil/uL (ref 3.87–5.11)
RDW: 13.4 % (ref 11.5–14.6)
WBC: 5.6 10*3/uL (ref 4.5–10.5)

## 2010-10-23 LAB — COMPREHENSIVE METABOLIC PANEL
ALT: 33 U/L (ref 0–35)
AST: 31 U/L (ref 0–37)
Albumin: 4.2 g/dL (ref 3.5–5.2)
Alkaline Phosphatase: 76 U/L (ref 39–117)
BUN: 13 mg/dL (ref 6–23)
CO2: 29 mEq/L (ref 19–32)
Calcium: 9.4 mg/dL (ref 8.4–10.5)
Chloride: 102 mEq/L (ref 96–112)
Creatinine, Ser: 0.8 mg/dL (ref 0.4–1.2)
GFR: 92.81 mL/min (ref >60.00)
Glucose, Bld: 102 mg/dL — ABNORMAL HIGH (ref 70–99)
Potassium: 4.5 mEq/L (ref 3.5–5.1)
Sodium: 143 mEq/L (ref 135–145)
Total Bilirubin: 0.5 mg/dL (ref 0.3–1.2)
Total Protein: 7.9 g/dL (ref 6.0–8.3)

## 2010-10-23 LAB — LIPID PANEL
Cholesterol: 212 mg/dL — ABNORMAL HIGH (ref 0–200)
HDL: 44.4 mg/dL (ref >39.00)
Total CHOL/HDL Ratio: 5
Triglycerides: 96 mg/dL (ref 0.0–149.0)
VLDL: 19.2 mg/dL (ref 0.0–40.0)

## 2010-10-23 LAB — LDL CHOLESTEROL, DIRECT: Direct LDL: 149.3 mg/dL

## 2010-10-23 MED ORDER — SERTRALINE HCL 100 MG PO TABS: 100.0000 mg | ORAL_TABLET | Freq: Every day | ORAL | Status: DC

## 2010-10-23 NOTE — Progress Notes (Signed)
  Subjective:    Patient ID: Tracy Sampson, female    DOB: 05-Nov-1955, 55 y.o.   MRN: [id number]  HPI  The patient presents for a follow-up of  chronic hypertension, chronic dyslipidemia, type 2 diabetes controlled with medicines  Wt Readings from Last 3 Encounters:  10/23/10 305 lb (138.347 kg)  08/15/10 312 lb 3.2 oz (141.613 kg)  04/24/10 313 lb (141.976 kg)    Review of Systems  Constitutional: Positive for fatigue. Negative for chills, activity change, appetite change and unexpected weight change.  HENT: Negative for congestion, mouth sores and sinus pressure.   Eyes: Negative for visual disturbance.  Respiratory: Negative for cough and chest tightness.   Gastrointestinal: Negative for nausea and abdominal pain.  Genitourinary: Negative for frequency, difficulty urinating and vaginal pain.  Musculoskeletal: Negative for back pain and gait problem.  Skin: Negative for pallor and rash.  Neurological: Negative for dizziness, tremors, weakness, numbness and headaches.  Psychiatric/Behavioral: Negative for confusion and sleep disturbance.       Objective:   Physical Exam  Constitutional: She appears well-developed and well-nourished. No distress.  HENT:  Head: Normocephalic.  Right Ear: External ear normal.  Left Ear: External ear normal.  Nose: Nose normal.  Mouth/Throat: Oropharynx is clear and moist.  Eyes: Conjunctivae are normal. Pupils are equal, round, and reactive to light. Right eye exhibits no discharge. Left eye exhibits no discharge.  Neck: Normal range of motion. Neck supple. No JVD present. No tracheal deviation present. No thyromegaly present.  Cardiovascular: Normal rate, regular rhythm and normal heart sounds.   Pulmonary/Chest: No stridor. No respiratory distress. She has no wheezes.  Abdominal: Soft. Bowel sounds are normal. She exhibits no distension and no mass. There is no tenderness. There is no rebound and no guarding.  Musculoskeletal: She exhibits  no edema and no tenderness.  Lymphadenopathy:    She has no cervical adenopathy.  Neurological: She displays normal reflexes. No cranial nerve deficit. She exhibits normal muscle tone. Coordination normal.  Skin: No rash noted. No erythema.  Psychiatric: She has a normal mood and affect. Her behavior is normal. Judgment and thought content normal.          Assessment & Plan:

## 2010-10-23 NOTE — Assessment & Plan Note (Signed)
On Rx 

## 2010-10-23 NOTE — Assessment & Plan Note (Signed)
Cont Rx 

## 2010-10-24 LAB — TSH: TSH: 0.26 u[IU]/mL — ABNORMAL LOW (ref 0.35–5.50)

## 2010-10-24 LAB — VITAMIN B12: Vitamin B-12: 828 pg/mL (ref 211–911)

## 2010-10-26 ENCOUNTER — Telehealth: Payer: Self-pay | Admitting: Internal Medicine

## 2010-10-26 NOTE — Telephone Encounter (Signed)
Stacey, please, inform patient that all labs are OK Thx   

## 2010-10-27 NOTE — Telephone Encounter (Signed)
Left mess informing pt of below.

## 2010-12-11 ENCOUNTER — Other Ambulatory Visit: Payer: Self-pay | Admitting: *Deleted

## 2010-12-11 MED ORDER — MOMETASONE FUROATE 50 MCG/ACT NA SUSP: 2.0000 | Freq: Every day | NASAL | Status: DC | PRN

## 2011-02-19 ENCOUNTER — Telehealth: Payer: Self-pay | Admitting: *Deleted

## 2011-02-19 NOTE — Telephone Encounter (Signed)
Pt called asking if she should have labs drawn prior to her 02-27-11 OV? Please advise.

## 2011-02-19 NOTE — Telephone Encounter (Signed)
BMET, TSH Thx

## 2011-02-20 NOTE — Telephone Encounter (Signed)
Left detailed mess informing pt of below.  

## 2011-02-27 ENCOUNTER — Ambulatory Visit (INDEPENDENT_AMBULATORY_CARE_PROVIDER_SITE_OTHER): Payer: Self-pay | Admitting: Internal Medicine

## 2011-02-27 ENCOUNTER — Encounter: Payer: Self-pay | Admitting: Internal Medicine

## 2011-02-27 ENCOUNTER — Other Ambulatory Visit (INDEPENDENT_AMBULATORY_CARE_PROVIDER_SITE_OTHER): Payer: Self-pay

## 2011-02-27 ENCOUNTER — Telehealth: Payer: Self-pay | Admitting: Internal Medicine

## 2011-02-27 VITALS — BP 180/90 | HR 68 | Temp 97.9°F | Resp 16 | Wt 299.0 lb

## 2011-02-27 DIAGNOSIS — I10 Essential (primary) hypertension: Secondary | ICD-10-CM

## 2011-02-27 DIAGNOSIS — G4730 Sleep apnea, unspecified: Secondary | ICD-10-CM

## 2011-02-27 DIAGNOSIS — E039 Hypothyroidism, unspecified: Secondary | ICD-10-CM

## 2011-02-27 DIAGNOSIS — R351 Nocturia: Secondary | ICD-10-CM

## 2011-02-27 DIAGNOSIS — F329 Major depressive disorder, single episode, unspecified: Secondary | ICD-10-CM

## 2011-02-27 LAB — BASIC METABOLIC PANEL
BUN: 10 mg/dL (ref 6–23)
CO2: 30 mEq/L (ref 19–32)
Calcium: 9.2 mg/dL (ref 8.4–10.5)
Chloride: 107 mEq/L (ref 96–112)
Creatinine, Ser: 0.8 mg/dL (ref 0.4–1.2)
GFR: 94.02 mL/min (ref >60.00)
Glucose, Bld: 88 mg/dL (ref 70–99)
Potassium: 3.6 mEq/L (ref 3.5–5.1)
Sodium: 145 mEq/L (ref 135–145)

## 2011-02-27 LAB — URINALYSIS
Bilirubin Urine: NEGATIVE
Ketones, ur: NEGATIVE
Leukocytes, UA: NEGATIVE
Nitrite: NEGATIVE
Specific Gravity, Urine: >=1.03 (ref 1.000–1.030)
Total Protein, Urine: NEGATIVE
Urine Glucose: NEGATIVE
Urobilinogen, UA: 0.2 (ref 0.0–1.0)
pH: 5.5 (ref 5.0–8.0)

## 2011-02-27 LAB — TSH: TSH: 2.13 u[IU]/mL (ref 0.35–5.50)

## 2011-02-27 MED ORDER — OLMESARTAN-AMLODIPINE-HCTZ 40-5-12.5 MG PO TABS: 1.0000 | ORAL_TABLET | Freq: Every day | ORAL | Status: DC

## 2011-02-27 NOTE — Assessment & Plan Note (Signed)
Better  

## 2011-02-27 NOTE — Progress Notes (Signed)
  Subjective:    Patient ID: Tracy Sampson, female    DOB: 1955/06/17, 55 y.o.   MRN: [id number]  HPI  The patient presents for a follow-up of  chronic hypertension, chronic dyslipidemia, GERD. HTN has not been controlled with medicines    Review of Systems  Constitutional: Negative for chills, activity change, appetite change, fatigue and unexpected weight change.  HENT: Negative for congestion, mouth sores and sinus pressure.   Eyes: Negative for visual disturbance.  Respiratory: Negative for cough and chest tightness.   Gastrointestinal: Negative for nausea and abdominal pain.  Genitourinary: Negative for frequency, difficulty urinating and vaginal pain.  Musculoskeletal: Negative for back pain and gait problem.  Skin: Negative for pallor and rash.  Neurological: Negative for dizziness, tremors, weakness, numbness and headaches.  Psychiatric/Behavioral: Negative for confusion and sleep disturbance.   BP Readings from Last 3 Encounters:  02/27/11 180/90  10/23/10 130/88  08/15/10 140/100   Wt Readings from Last 3 Encounters:  02/27/11 299 lb (135.626 kg)  10/23/10 305 lb (138.347 kg)  08/15/10 312 lb 3.2 oz (141.613 kg)        Objective:   Physical Exam  Constitutional: She appears well-developed. No distress.       obese  HENT:  Head: Normocephalic.  Right Ear: External ear normal.  Left Ear: External ear normal.  Nose: Nose normal.  Mouth/Throat: Oropharynx is clear and moist.  Eyes: Conjunctivae are normal. Pupils are equal, round, and reactive to light. Right eye exhibits no discharge. Left eye exhibits no discharge.  Neck: Normal range of motion. Neck supple. No JVD present. No tracheal deviation present. No thyromegaly present.  Cardiovascular: Normal rate, regular rhythm and normal heart sounds.   Pulmonary/Chest: No stridor. No respiratory distress. She has no wheezes.  Abdominal: Soft. Bowel sounds are normal. She exhibits no distension and no mass. There  is no tenderness. There is no rebound and no guarding.  Musculoskeletal: She exhibits no edema and no tenderness.  Lymphadenopathy:    She has no cervical adenopathy.  Neurological: She displays normal reflexes. No cranial nerve deficit. She exhibits normal muscle tone. Coordination normal.  Skin: No rash noted. No erythema.  Psychiatric: She has a normal mood and affect. Her behavior is normal. Judgment and thought content normal.     Lab Results  Component Value Date   WBC 5.6 10/23/2010   HGB 12.9 10/23/2010   HCT 38.8 10/23/2010   PLT 209.0 10/23/2010   GLUCOSE 88 02/27/2011   CHOL 212* 10/23/2010   TRIG 96.0 10/23/2010   HDL 44.40 10/23/2010   LDLDIRECT 149.3 10/23/2010   LDLCALC 132* 05/03/2007   ALT 33 10/23/2010   AST 31 10/23/2010   NA 145 02/27/2011   K 3.6 02/27/2011   CL 107 02/27/2011   CREATININE 0.8 02/27/2011   BUN 10 02/27/2011   CO2 30 02/27/2011   TSH 2.13 02/27/2011         Assessment & Plan:

## 2011-02-27 NOTE — Telephone Encounter (Signed)
Stacey, please, inform patient that all labs are OK Thx   

## 2011-02-27 NOTE — Assessment & Plan Note (Signed)
On CPAP. ?

## 2011-02-27 NOTE — Assessment & Plan Note (Signed)
Check TSH 

## 2011-02-27 NOTE — Assessment & Plan Note (Addendum)
Not well controlled: see med changes. She denies any previous allergies to meds. Will try Tribenzor

## 2011-02-27 NOTE — Assessment & Plan Note (Signed)
Change  prescription therapy as reflected on the Med list. Check labs

## 2011-02-27 NOTE — Assessment & Plan Note (Signed)
Doing better on diet 

## 2011-03-03 NOTE — Telephone Encounter (Signed)
Pt informed

## 2011-04-10 ENCOUNTER — Other Ambulatory Visit: Payer: Self-pay | Admitting: Internal Medicine

## 2011-04-10 DIAGNOSIS — Z1231 Encounter for screening mammogram for malignant neoplasm of breast: Secondary | ICD-10-CM

## 2011-04-27 ENCOUNTER — Other Ambulatory Visit: Payer: Self-pay | Admitting: *Deleted

## 2011-04-27 ENCOUNTER — Telehealth: Payer: Self-pay | Admitting: *Deleted

## 2011-04-27 MED ORDER — ATENOLOL 100 MG PO TABS: 100.0000 mg | ORAL_TABLET | Freq: Two times a day (BID) | ORAL | Status: DC

## 2011-04-27 MED ORDER — LEVOTHYROXINE SODIUM 175 MCG PO TABS: 175.0000 ug | ORAL_TABLET | Freq: Every day | ORAL | Status: DC

## 2011-04-27 NOTE — Telephone Encounter (Signed)
Rf req for cardura 4 mg. 1 po bid. This med is not on active med list. Ok to Rf?

## 2011-04-27 NOTE — Telephone Encounter (Signed)
OK to fill this prescription with additional refills x11 Thank you!  

## 2011-04-28 MED ORDER — DOXAZOSIN MESYLATE 4 MG PO TABS: 4.0000 mg | ORAL_TABLET | Freq: Every day | ORAL | Status: DC

## 2011-04-29 ENCOUNTER — Other Ambulatory Visit: Payer: Self-pay | Admitting: *Deleted

## 2011-04-29 MED ORDER — DOXAZOSIN MESYLATE 4 MG PO TABS: 4.0000 mg | ORAL_TABLET | Freq: Two times a day (BID) | ORAL | Status: DC

## 2011-05-18 ENCOUNTER — Ambulatory Visit (HOSPITAL_COMMUNITY)
Admission: RE | Admit: 2011-05-18 | Discharge: 2011-05-18 | Disposition: A | Discharge status: Home / Self Care | Payer: Self-pay | Source: Ambulatory Visit | Attending: Internal Medicine | Admitting: Internal Medicine

## 2011-05-18 DIAGNOSIS — Z1231 Encounter for screening mammogram for malignant neoplasm of breast: Secondary | ICD-10-CM

## 2011-06-12 ENCOUNTER — Ambulatory Visit (INDEPENDENT_AMBULATORY_CARE_PROVIDER_SITE_OTHER): Payer: Self-pay | Admitting: Internal Medicine

## 2011-06-12 ENCOUNTER — Encounter: Payer: Self-pay | Admitting: Internal Medicine

## 2011-06-12 VITALS — BP 160/80 | HR 80 | Temp 97.7°F | Resp 16 | Wt 301.0 lb

## 2011-06-12 DIAGNOSIS — I10 Essential (primary) hypertension: Secondary | ICD-10-CM

## 2011-06-12 MED ORDER — AMLODIPINE BESYLATE 5 MG PO TABS: 5.0000 mg | ORAL_TABLET | Freq: Every day | ORAL | Status: DC

## 2011-06-12 NOTE — Assessment & Plan Note (Signed)
See med change 

## 2011-06-12 NOTE — Progress Notes (Signed)
  Subjective:    Patient ID: Tracy Sampson, female    DOB: March 10, 1956, 56 y.o.   MRN: [id number]  HPI  The patient presents for a follow-up of  chronic hypertension, chronic dyslipidemia, type 2 pre-diabetes controlled with medicines, diet. She did not feel good on Tribenzor - no specific reaction...    Review of Systems  Constitutional: Negative for chills, activity change, appetite change, fatigue and unexpected weight change.  HENT: Negative for congestion, mouth sores and sinus pressure.   Eyes: Negative for visual disturbance.  Respiratory: Negative for cough and chest tightness.   Gastrointestinal: Negative for nausea and abdominal pain.  Genitourinary: Negative for frequency, difficulty urinating and vaginal pain.  Musculoskeletal: Negative for back pain and gait problem.  Skin: Negative for pallor and rash.  Neurological: Negative for dizziness, tremors, weakness, numbness and headaches.  Psychiatric/Behavioral: Negative for confusion and sleep disturbance.       Objective:   Physical Exam  Constitutional: She appears well-developed. No distress.       Obese   HENT:  Head: Normocephalic.  Right Ear: External ear normal.  Left Ear: External ear normal.  Nose: Nose normal.  Mouth/Throat: Oropharynx is clear and moist.  Eyes: Conjunctivae are normal. Pupils are equal, round, and reactive to light. Right eye exhibits no discharge. Left eye exhibits no discharge.  Neck: Normal range of motion. Neck supple. No JVD present. No tracheal deviation present. No thyromegaly present.  Cardiovascular: Normal rate, regular rhythm and normal heart sounds.   Pulmonary/Chest: No stridor. No respiratory distress. She has no wheezes.  Abdominal: Soft. Bowel sounds are normal. She exhibits no distension and no mass. There is no tenderness. There is no rebound and no guarding.  Musculoskeletal: She exhibits no edema and no tenderness.  Lymphadenopathy:    She has no cervical adenopathy.   Neurological: She displays normal reflexes. No cranial nerve deficit. She exhibits normal muscle tone. Coordination normal.  Skin: No rash noted. No erythema.  Psychiatric: She has a normal mood and affect. Her behavior is normal. Judgment and thought content normal.          Assessment & Plan:

## 2011-09-11 ENCOUNTER — Telehealth: Payer: Self-pay | Admitting: *Deleted

## 2011-09-11 ENCOUNTER — Other Ambulatory Visit (INDEPENDENT_AMBULATORY_CARE_PROVIDER_SITE_OTHER): Payer: Self-pay

## 2011-09-11 ENCOUNTER — Encounter: Payer: Self-pay | Admitting: Internal Medicine

## 2011-09-11 ENCOUNTER — Ambulatory Visit (INDEPENDENT_AMBULATORY_CARE_PROVIDER_SITE_OTHER): Payer: Self-pay | Admitting: Internal Medicine

## 2011-09-11 VITALS — BP 148/92 | HR 80 | Temp 98.3°F | Resp 16 | Wt 307.0 lb

## 2011-09-11 DIAGNOSIS — I10 Essential (primary) hypertension: Secondary | ICD-10-CM

## 2011-09-11 DIAGNOSIS — F329 Major depressive disorder, single episode, unspecified: Secondary | ICD-10-CM

## 2011-09-11 LAB — BASIC METABOLIC PANEL
BUN: 13 mg/dL (ref 6–23)
CO2: 32 mEq/L (ref 19–32)
Calcium: 9.6 mg/dL (ref 8.4–10.5)
Chloride: 103 mEq/L (ref 96–112)
Creatinine, Ser: 0.8 mg/dL (ref 0.4–1.2)
GFR: 95.19 mL/min (ref >60.00)
Glucose, Bld: 90 mg/dL (ref 70–99)
Potassium: 4.8 mEq/L (ref 3.5–5.1)
Sodium: 143 mEq/L (ref 135–145)

## 2011-09-11 NOTE — Telephone Encounter (Signed)
All numbers for pt are either not working, has no vm or no one knows her there. I called EC Tracy Sampson and Left mess for patient to call back.

## 2011-09-11 NOTE — Assessment & Plan Note (Signed)
better 

## 2011-09-11 NOTE — Assessment & Plan Note (Signed)
Discussed. She will loose wt. She declined diuretics. NAS diet

## 2011-09-11 NOTE — Telephone Encounter (Signed)
Message copied by Merrilyn Puma on Fri Sep 11, 2011  4:24 PM ------      Message from: Janeal Holmes      Created: Fri Sep 11, 2011  1:58 PM       Misty Stanley, please, inform patient that all labs are OK      Thank you!

## 2011-09-11 NOTE — Progress Notes (Signed)
Patient ID: Tracy Sampson, female   DOB: 21-Dec-1955, 56 y.o.   MRN: [id number]  Subjective:    Patient ID: Tracy Sampson, female    DOB: 05/08/1956, 56 y.o.   MRN: [id number]  HPI  The patient presents for a follow-up of  chronic hypertension, chronic dyslipidemia, type 2 pre-diabetes controlled with medicines, diet. She did not feel good on Tribenzor - no specific reaction.Marland KitchenMarland KitchenShe is graduating this May with SS degree    Wt Readings from Last 3 Encounters:  09/11/11 307 lb (139.254 kg)  06/12/11 301 lb (136.533 kg)  02/27/11 299 lb (135.626 kg)   BP Readings from Last 3 Encounters:  09/11/11 148/92  06/12/11 160/80  02/27/11 180/90      Review of Systems  Constitutional: Negative for chills, activity change, appetite change, fatigue and unexpected weight change.  HENT: Negative for congestion, mouth sores and sinus pressure.   Eyes: Negative for visual disturbance.  Respiratory: Negative for cough and chest tightness.   Gastrointestinal: Negative for nausea and abdominal pain.  Genitourinary: Negative for frequency, difficulty urinating and vaginal pain.  Musculoskeletal: Negative for back pain and gait problem.  Skin: Negative for pallor and rash.  Neurological: Negative for dizziness, tremors, weakness, numbness and headaches.  Psychiatric/Behavioral: Negative for confusion and sleep disturbance.       Objective:   Physical Exam  Constitutional: She appears well-developed. No distress.       Obese   HENT:  Head: Normocephalic.  Right Ear: External ear normal.  Left Ear: External ear normal.  Nose: Nose normal.  Mouth/Throat: Oropharynx is clear and moist.  Eyes: Conjunctivae are normal. Pupils are equal, round, and reactive to light. Right eye exhibits no discharge. Left eye exhibits no discharge.  Neck: Normal range of motion. Neck supple. No JVD present. No tracheal deviation present. No thyromegaly present.  Cardiovascular: Normal rate, regular rhythm and  normal heart sounds.   Pulmonary/Chest: No stridor. No respiratory distress. She has no wheezes.  Abdominal: Soft. Bowel sounds are normal. She exhibits no distension and no mass. There is no tenderness. There is no rebound and no guarding.  Musculoskeletal: She exhibits no edema and no tenderness.  Lymphadenopathy:    She has no cervical adenopathy.  Neurological: She displays normal reflexes. No cranial nerve deficit. She exhibits normal muscle tone. Coordination normal.  Skin: No rash noted. No erythema.  Psychiatric: She has a normal mood and affect. Her behavior is normal. Judgment and thought content normal.    Lab Results  Component Value Date   WBC 5.6 10/23/2010   HGB 12.9 10/23/2010   HCT 38.8 10/23/2010   PLT 209.0 10/23/2010   GLUCOSE 88 02/27/2011   CHOL 212* 10/23/2010   TRIG 96.0 10/23/2010   HDL 44.40 10/23/2010   LDLDIRECT 149.3 10/23/2010   LDLCALC 132* 05/03/2007   ALT 33 10/23/2010   AST 31 10/23/2010   NA 145 02/27/2011   K 3.6 02/27/2011   CL 107 02/27/2011   CREATININE 0.8 02/27/2011   BUN 10 02/27/2011   CO2 30 02/27/2011   TSH 2.13 02/27/2011         Assessment & Plan:

## 2011-09-23 NOTE — Telephone Encounter (Signed)
Will mail letter re: below.

## 2011-09-28 ENCOUNTER — Telehealth: Payer: Self-pay | Admitting: *Deleted

## 2011-09-28 DIAGNOSIS — Z0000 Encounter for general adult medical examination without abnormal findings: Secondary | ICD-10-CM

## 2011-09-28 NOTE — Telephone Encounter (Signed)
12/2011 labs entered.

## 2011-10-26 ENCOUNTER — Other Ambulatory Visit: Payer: Self-pay | Admitting: *Deleted

## 2011-10-26 MED ORDER — SERTRALINE HCL 100 MG PO TABS: 100.0000 mg | ORAL_TABLET | Freq: Every day | ORAL | Status: DC

## 2011-10-29 ENCOUNTER — Other Ambulatory Visit: Payer: Self-pay | Admitting: *Deleted

## 2011-10-29 MED ORDER — DOXAZOSIN MESYLATE 4 MG PO TABS: 4.0000 mg | ORAL_TABLET | Freq: Two times a day (BID) | ORAL | Status: DC

## 2011-10-29 NOTE — Addendum Note (Signed)
Addended by: Merrilyn Puma on: 10/29/2011 01:53 PM   Modules accepted: Orders

## 2011-11-26 ENCOUNTER — Other Ambulatory Visit: Payer: Self-pay | Admitting: *Deleted

## 2011-11-26 MED ORDER — LEVOTHYROXINE SODIUM 175 MCG PO TABS: 175.0000 ug | ORAL_TABLET | Freq: Every day | ORAL | Status: DC

## 2011-11-30 ENCOUNTER — Telehealth: Payer: Self-pay

## 2011-11-30 MED ORDER — LEVOTHYROXINE SODIUM 175 MCG PO TABS: 175.0000 ug | ORAL_TABLET | Freq: Every day | ORAL | Status: DC

## 2011-11-30 NOTE — Telephone Encounter (Signed)
Rx faxed to [facility] per pt request. Pt informed via home VM.

## 2011-11-30 NOTE — Telephone Encounter (Signed)
Rx printed and awaiting MD's signature.

## 2011-12-30 ENCOUNTER — Other Ambulatory Visit: Payer: Self-pay | Admitting: Internal Medicine

## 2011-12-31 MED ORDER — ATENOLOL 100 MG PO TABS: 100.0000 mg | ORAL_TABLET | Freq: Two times a day (BID) | ORAL | Status: DC

## 2012-01-11 ENCOUNTER — Ambulatory Visit (INDEPENDENT_AMBULATORY_CARE_PROVIDER_SITE_OTHER): Payer: Self-pay | Admitting: Internal Medicine

## 2012-01-11 ENCOUNTER — Encounter: Payer: Self-pay | Admitting: Internal Medicine

## 2012-01-11 VITALS — BP 132/90 | HR 65 | Temp 98.0°F | Ht 66.0 in | Wt 308.0 lb

## 2012-01-11 DIAGNOSIS — E039 Hypothyroidism, unspecified: Secondary | ICD-10-CM

## 2012-01-11 DIAGNOSIS — Z0000 Encounter for general adult medical examination without abnormal findings: Secondary | ICD-10-CM

## 2012-01-11 DIAGNOSIS — K219 Gastro-esophageal reflux disease without esophagitis: Secondary | ICD-10-CM

## 2012-01-11 NOTE — Patient Instructions (Addendum)
[email or web address]

## 2012-01-11 NOTE — Assessment & Plan Note (Signed)
Continue with current prescription therapy as reflected on the Med list.  

## 2012-01-11 NOTE — Progress Notes (Signed)
Patient ID: Tracy Sampson, female   DOB: Apr 22, 1956, 56 y.o.   MRN: [id number] Patient ID: Tracy Sampson, female   DOB: 1955/10/09, 56 y.o.   MRN: [id number]  Subjective:    Patient ID: Tracy Sampson, female    DOB: 23-Jun-1955, 56 y.o.   MRN: [id number]  HPI  The patient presents for a follow-up of  chronic hypertension, chronic dyslipidemia, type 2 pre-diabetes controlled with medicines, diet. She did not feel good on Tribenzor - no specific reaction.Marland KitchenMarland KitchenShe is graduated this May with SS degree  - working part time  [employer] from Last 3 Encounters:  01/11/12 308 lb (139.708 kg)  09/11/11 307 lb (139.254 kg)  06/12/11 301 lb (136.533 kg)   BP Readings from Last 3 Encounters:  01/11/12 132/90  09/11/11 148/92  06/12/11 160/80      Review of Systems  Constitutional: Negative for chills, activity change, appetite change, fatigue and unexpected weight change.  HENT: Negative for congestion, mouth sores and sinus pressure.   Eyes: Negative for visual disturbance.  Respiratory: Negative for cough and chest tightness.   Gastrointestinal: Negative for nausea and abdominal pain.  Genitourinary: Negative for frequency, difficulty urinating and vaginal pain.  Musculoskeletal: Negative for back pain and gait problem.  Skin: Negative for pallor and rash.  Neurological: Negative for dizziness, tremors, weakness, numbness and headaches.  Psychiatric/Behavioral: Negative for confusion and disturbed wake/sleep cycle.       Objective:   Physical Exam  Constitutional: She appears well-developed. No distress.       Obese   HENT:  Head: Normocephalic.  Right Ear: External ear normal.  Left Ear: External ear normal.  Nose: Nose normal.  Mouth/Throat: Oropharynx is clear and moist.  Eyes: Conjunctivae are normal. Pupils are equal, round, and reactive to light. Right eye exhibits no discharge. Left eye exhibits no discharge.  Neck: Normal range of motion. Neck supple. No JVD present.  No tracheal deviation present. No thyromegaly present.  Cardiovascular: Normal rate, regular rhythm and normal heart sounds.   Pulmonary/Chest: No stridor. No respiratory distress. She has no wheezes.  Abdominal: Soft. Bowel sounds are normal. She exhibits no distension and no mass. There is no tenderness. There is no rebound and no guarding.  Musculoskeletal: She exhibits no edema and no tenderness.  Lymphadenopathy:    She has no cervical adenopathy.  Neurological: She displays normal reflexes. No cranial nerve deficit. She exhibits normal muscle tone. Coordination normal.  Skin: No rash noted. No erythema.  Psychiatric: She has a normal mood and affect. Her behavior is normal. Judgment and thought content normal.    Lab Results  Component Value Date   WBC 5.6 10/23/2010   HGB 12.9 10/23/2010   HCT 38.8 10/23/2010   PLT 209.0 10/23/2010   GLUCOSE 90 09/11/2011   CHOL 212* 10/23/2010   TRIG 96.0 10/23/2010   HDL 44.40 10/23/2010   LDLDIRECT 149.3 10/23/2010   LDLCALC 132* 05/03/2007   ALT 33 10/23/2010   AST 31 10/23/2010   NA 143 09/11/2011   K 4.8 09/11/2011   CL 103 09/11/2011   CREATININE 0.8 09/11/2011   BUN 13 09/11/2011   CO2 32 09/11/2011   TSH 2.13 02/27/2011         Assessment & Plan:

## 2012-03-03 ENCOUNTER — Other Ambulatory Visit: Payer: Self-pay | Admitting: *Deleted

## 2012-03-03 MED ORDER — DOXAZOSIN MESYLATE 4 MG PO TABS: 4.0000 mg | ORAL_TABLET | Freq: Two times a day (BID) | ORAL | Status: DC

## 2012-04-29 ENCOUNTER — Other Ambulatory Visit: Payer: Self-pay | Admitting: *Deleted

## 2012-04-29 MED ORDER — LEVOTHYROXINE SODIUM 175 MCG PO TABS: 175.0000 ug | ORAL_TABLET | Freq: Every day | ORAL | Status: DC

## 2012-06-06 ENCOUNTER — Ambulatory Visit: Payer: Self-pay | Admitting: Internal Medicine

## 2012-06-17 ENCOUNTER — Other Ambulatory Visit: Payer: Self-pay | Admitting: *Deleted

## 2012-06-17 MED ORDER — ATENOLOL 100 MG PO TABS: 100.0000 mg | ORAL_TABLET | Freq: Two times a day (BID) | ORAL | Status: DC

## 2012-06-17 MED ORDER — AMLODIPINE BESYLATE 5 MG PO TABS: 5.0000 mg | ORAL_TABLET | Freq: Every day | ORAL | Status: DC

## 2012-06-23 ENCOUNTER — Other Ambulatory Visit: Payer: Self-pay | Admitting: Internal Medicine

## 2012-06-23 DIAGNOSIS — Z1231 Encounter for screening mammogram for malignant neoplasm of breast: Secondary | ICD-10-CM

## 2012-07-04 ENCOUNTER — Ambulatory Visit (HOSPITAL_COMMUNITY): Payer: Self-pay

## 2012-07-07 ENCOUNTER — Encounter (HOSPITAL_COMMUNITY): Payer: Self-pay

## 2012-07-08 ENCOUNTER — Ambulatory Visit (HOSPITAL_COMMUNITY)
Admission: RE | Admit: 2012-07-08 | Discharge: 2012-07-08 | Disposition: A | Discharge status: Home / Self Care | Payer: Self-pay | Source: Ambulatory Visit | Attending: Internal Medicine | Admitting: Internal Medicine

## 2012-07-08 DIAGNOSIS — Z1231 Encounter for screening mammogram for malignant neoplasm of breast: Secondary | ICD-10-CM

## 2012-08-05 ENCOUNTER — Other Ambulatory Visit: Payer: Self-pay | Admitting: *Deleted

## 2012-08-05 MED ORDER — DOXAZOSIN MESYLATE 4 MG PO TABS: 4.0000 mg | ORAL_TABLET | Freq: Two times a day (BID) | ORAL | Status: DC

## 2012-10-21 ENCOUNTER — Other Ambulatory Visit: Payer: Self-pay | Admitting: *Deleted

## 2012-10-21 NOTE — Telephone Encounter (Signed)
No need to change unless the pt has requested Thx

## 2012-10-21 NOTE — Telephone Encounter (Signed)
She wants change.

## 2012-10-21 NOTE — Telephone Encounter (Signed)
Rec fax requesting to change Zoloft to Cymbalta due to cost savings. Please fax new Rx to [phone] if appropriate.

## 2012-10-24 ENCOUNTER — Other Ambulatory Visit: Payer: Self-pay | Admitting: Internal Medicine

## 2012-10-24 MED ORDER — SERTRALINE HCL 100 MG PO TABS: 100.0000 mg | ORAL_TABLET | Freq: Every day | ORAL | Status: DC

## 2012-10-24 MED ORDER — DULOXETINE HCL 60 MG PO CPEP: 60.0000 mg | ORAL_CAPSULE | Freq: Every day | ORAL | Status: DC

## 2012-10-24 NOTE — Telephone Encounter (Signed)
Pt is requesting to change Zoloft to Cymbalta due to cost savings. Please fax new Rx to [phone] if appropriate. Zoloft was sent in last week and she does not want it.

## 2012-10-24 NOTE — Telephone Encounter (Signed)
Peggy called because the Zoloft was called in.  The patient wants cymbalta instead due the cost.

## 2012-10-24 NOTE — Telephone Encounter (Signed)
Ok - I'm surprised Cymbalta would cost less Thx

## 2012-10-24 NOTE — Telephone Encounter (Signed)
Ok Thx 

## 2012-10-28 ENCOUNTER — Telehealth: Payer: Self-pay

## 2012-10-28 NOTE — Telephone Encounter (Signed)
Patient called stating cymbalta is not working for her . She is requesting generic zoloft to [address]. Please advise thanks

## 2012-10-30 ENCOUNTER — Other Ambulatory Visit: Payer: Self-pay | Admitting: Internal Medicine

## 2012-10-30 MED ORDER — SERTRALINE HCL 100 MG PO TABS: 100.0000 mg | ORAL_TABLET | Freq: Every day | ORAL | Status: DC

## 2012-10-30 NOTE — Telephone Encounter (Signed)
Done. Thx.

## 2012-11-01 ENCOUNTER — Telehealth: Payer: Self-pay | Admitting: Internal Medicine

## 2012-11-01 NOTE — Telephone Encounter (Signed)
Pt called back and stated that generic for Zoloft was call in but pt does not want to take the capsule due to side effects and req tablet instead. Please advise.

## 2012-11-02 ENCOUNTER — Other Ambulatory Visit: Payer: Self-pay | Admitting: *Deleted

## 2012-11-02 ENCOUNTER — Telehealth: Payer: Self-pay | Admitting: *Deleted

## 2012-11-02 MED ORDER — SERTRALINE HCL 100 MG PO TABS: 100.0000 mg | ORAL_TABLET | Freq: Every day | ORAL | Status: DC

## 2012-11-02 NOTE — Telephone Encounter (Signed)
We did not send in Rx for capsules. Tried to call pt- no answer.

## 2012-11-02 NOTE — Telephone Encounter (Signed)
Pt states [facility] pharmacy can only fill Zoloft capsules. She requests this be sent to [facility] so she can fill tablets. Rf sent to [facility]. Pt informed

## 2013-01-09 ENCOUNTER — Encounter: Payer: Self-pay | Admitting: Internal Medicine

## 2013-01-09 ENCOUNTER — Ambulatory Visit (INDEPENDENT_AMBULATORY_CARE_PROVIDER_SITE_OTHER): Payer: Self-pay | Admitting: Internal Medicine

## 2013-01-09 VITALS — BP 124/82 | HR 73 | Temp 98.7°F | Wt 281.0 lb

## 2013-01-09 DIAGNOSIS — F329 Major depressive disorder, single episode, unspecified: Secondary | ICD-10-CM

## 2013-01-09 DIAGNOSIS — I10 Essential (primary) hypertension: Secondary | ICD-10-CM

## 2013-01-09 NOTE — Assessment & Plan Note (Signed)
Continue with current prescription therapy as reflected on the Med list.  

## 2013-01-09 NOTE — Assessment & Plan Note (Signed)
Much better on diet Wt Readings from Last 3 Encounters:  01/09/13 281 lb (127.461 kg)  01/11/12 308 lb (139.708 kg)  09/11/11 307 lb (139.254 kg)

## 2013-01-09 NOTE — Progress Notes (Signed)
  Subjective:    Patient ID: Tracy Sampson, female    DOB: 10-18-1955, 57 y.o.   MRN: [id number]  HPI  The patient presents for a follow-up of  chronic hypertension, chronic dyslipidemia, type 2 pre diabetes controlled with medicines She lost wt on diet  Wt Readings from Last 3 Encounters:  01/09/13 281 lb (127.461 kg)  01/11/12 308 lb (139.708 kg)  09/11/11 307 lb (139.254 kg)   BP Readings from Last 3 Encounters:  01/09/13 124/82  01/11/12 132/90  09/11/11 148/92      Review of Systems  Constitutional: Negative for fatigue.  HENT: Negative for rhinorrhea and sinus pressure.   Respiratory: Negative for wheezing.   Gastrointestinal: Negative for abdominal pain.  Endocrine: Negative for polyphagia.  Genitourinary: Negative for dysuria and urgency.  Skin: Negative for rash.  Neurological: Negative for weakness and numbness.  Psychiatric/Behavioral: Negative for suicidal ideas and confusion.       Objective:   Physical Exam  Constitutional: She appears well-developed. No distress.  obese  HENT:  Head: Normocephalic.  Right Ear: External ear normal.  Left Ear: External ear normal.  Nose: Nose normal.  Mouth/Throat: Oropharynx is clear and moist.  Eyes: Conjunctivae are normal. Pupils are equal, round, and reactive to light. Right eye exhibits no discharge. Left eye exhibits no discharge.  Neck: Normal range of motion. Neck supple. No JVD present. No tracheal deviation present. No thyromegaly present.  Cardiovascular: Normal rate, regular rhythm and normal heart sounds.   Pulmonary/Chest: No stridor. No respiratory distress. She has no wheezes.  Abdominal: Soft. Bowel sounds are normal. She exhibits no distension and no mass. There is no tenderness. There is no rebound and no guarding.  Musculoskeletal: She exhibits no edema and no tenderness.  Lymphadenopathy:    She has no cervical adenopathy.  Neurological: She displays normal reflexes. No cranial nerve deficit.  She exhibits normal muscle tone. Coordination normal.  Skin: No rash noted. No erythema.  Psychiatric: She has a normal mood and affect. Her behavior is normal. Judgment and thought content normal.          Assessment & Plan:

## 2013-01-09 NOTE — Patient Instructions (Addendum)
Wt Readings from Last 3 Encounters:  01/09/13 281 lb (127.461 kg)  01/11/12 308 lb (139.708 kg)  09/11/11 307 lb (139.254 kg)   BP Readings from Last 3 Encounters:  01/09/13 124/82  01/11/12 132/90  09/11/11 148/92   [email or web address]  Kegel exercises [email or web address]

## 2013-01-12 ENCOUNTER — Other Ambulatory Visit: Payer: Self-pay | Admitting: *Deleted

## 2013-01-12 MED ORDER — AMLODIPINE BESYLATE 5 MG PO TABS: 5.0000 mg | ORAL_TABLET | Freq: Every day | ORAL | Status: DC

## 2013-01-12 MED ORDER — LEVOTHYROXINE SODIUM 175 MCG PO TABS: 175.0000 ug | ORAL_TABLET | Freq: Every day | ORAL | Status: DC

## 2013-01-12 MED ORDER — DOXAZOSIN MESYLATE 4 MG PO TABS: 4.0000 mg | ORAL_TABLET | Freq: Two times a day (BID) | ORAL | Status: DC

## 2013-04-14 ENCOUNTER — Other Ambulatory Visit: Payer: Self-pay | Admitting: *Deleted

## 2013-04-14 MED ORDER — DOXAZOSIN MESYLATE 4 MG PO TABS: 4.0000 mg | ORAL_TABLET | Freq: Two times a day (BID) | ORAL | Status: DC

## 2013-04-14 MED ORDER — AMLODIPINE BESYLATE 5 MG PO TABS: 5.0000 mg | ORAL_TABLET | Freq: Every day | ORAL | Status: DC

## 2013-04-17 ENCOUNTER — Ambulatory Visit: Payer: Self-pay | Admitting: Internal Medicine

## 2013-05-04 ENCOUNTER — Other Ambulatory Visit: Payer: Self-pay | Admitting: Internal Medicine

## 2013-08-16 ENCOUNTER — Other Ambulatory Visit (INDEPENDENT_AMBULATORY_CARE_PROVIDER_SITE_OTHER): Payer: BC Managed Care – PPO

## 2013-08-16 ENCOUNTER — Encounter: Payer: Self-pay | Admitting: Internal Medicine

## 2013-08-16 ENCOUNTER — Other Ambulatory Visit: Payer: Self-pay | Admitting: Internal Medicine

## 2013-08-16 ENCOUNTER — Ambulatory Visit (INDEPENDENT_AMBULATORY_CARE_PROVIDER_SITE_OTHER): Payer: BC Managed Care – PPO | Admitting: Internal Medicine

## 2013-08-16 VITALS — BP 156/82 | HR 80 | Temp 97.3°F | Resp 16 | Wt 276.0 lb

## 2013-08-16 DIAGNOSIS — R209 Unspecified disturbances of skin sensation: Secondary | ICD-10-CM

## 2013-08-16 DIAGNOSIS — Z1231 Encounter for screening mammogram for malignant neoplasm of breast: Secondary | ICD-10-CM

## 2013-08-16 DIAGNOSIS — E042 Nontoxic multinodular goiter: Secondary | ICD-10-CM

## 2013-08-16 DIAGNOSIS — M6530 Trigger finger, unspecified finger: Secondary | ICD-10-CM

## 2013-08-16 DIAGNOSIS — R32 Unspecified urinary incontinence: Secondary | ICD-10-CM | POA: Insufficient documentation

## 2013-08-16 DIAGNOSIS — R202 Paresthesia of skin: Secondary | ICD-10-CM

## 2013-08-16 DIAGNOSIS — E559 Vitamin D deficiency, unspecified: Secondary | ICD-10-CM

## 2013-08-16 DIAGNOSIS — R7309 Other abnormal glucose: Secondary | ICD-10-CM

## 2013-08-16 DIAGNOSIS — I10 Essential (primary) hypertension: Secondary | ICD-10-CM

## 2013-08-16 DIAGNOSIS — N393 Stress incontinence (female) (male): Secondary | ICD-10-CM

## 2013-08-16 DIAGNOSIS — R739 Hyperglycemia, unspecified: Secondary | ICD-10-CM

## 2013-08-16 LAB — HEMOGLOBIN A1C: Hgb A1c MFr Bld: 5.5 % (ref 4.6–6.5)

## 2013-08-16 MED ORDER — MIRABEGRON ER 50 MG PO TB24: 50.0000 mg | ORAL_TABLET | Freq: Every day | ORAL | Status: DC

## 2013-08-16 NOTE — Progress Notes (Signed)
Pre visit review using our clinic review tool, if applicable. No additional management support is needed unless otherwise documented below in the visit note. 

## 2013-08-16 NOTE — Assessment & Plan Note (Signed)
Continue with current prescription therapy as reflected on the Med list. Labs  

## 2013-08-16 NOTE — Assessment & Plan Note (Signed)
Vit D level.

## 2013-08-16 NOTE — Progress Notes (Signed)
Subjective:    HPI  The patient presents for a follow-up of  chronic hypertension, chronic dyslipidemia, type 2 pre-diabetes controlled with medicines, diet. She did not feel good on Tribenzor - no specific reaction.Marland KitchenMarland KitchenShe is graduated this May with SS degree  - working part time  [employer] Readings from Last 3 Encounters:  08/16/13 276 lb (125.193 kg)  01/09/13 281 lb (127.461 kg)  01/11/12 308 lb (139.708 kg)   BP Readings from Last 3 Encounters:  08/16/13 156/82  01/09/13 124/82  01/11/12 132/90      Review of Systems  Constitutional: Negative for chills, activity change, appetite change, fatigue and unexpected weight change.  HENT: Negative for congestion, mouth sores and sinus pressure.   Eyes: Negative for visual disturbance.  Respiratory: Negative for cough and chest tightness.   Gastrointestinal: Negative for nausea and abdominal pain.  Genitourinary: Negative for frequency, difficulty urinating and vaginal pain.  Musculoskeletal: Negative for back pain and gait problem.  Skin: Negative for pallor and rash.  Neurological: Negative for dizziness, tremors, weakness, numbness and headaches.  Psychiatric/Behavioral: Negative for confusion and sleep disturbance.       Objective:   Physical Exam  Constitutional: She appears well-developed. No distress.  Obese   HENT:  Head: Normocephalic.  Right Ear: External ear normal.  Left Ear: External ear normal.  Nose: Nose normal.  Mouth/Throat: Oropharynx is clear and moist.  Eyes: Conjunctivae are normal. Pupils are equal, round, and reactive to light. Right eye exhibits no discharge. Left eye exhibits no discharge.  Neck: Normal range of motion. Neck supple. No JVD present. No tracheal deviation present. No thyromegaly present.  Cardiovascular: Normal rate, regular rhythm and normal heart sounds.   Pulmonary/Chest: No stridor. No respiratory distress. She has no wheezes.  Abdominal: Soft. Bowel sounds are normal. She exhibits  no distension and no mass. There is no tenderness. There is no rebound and no guarding.  Musculoskeletal: She exhibits no edema and no tenderness.  Lymphadenopathy:    She has no cervical adenopathy.  Neurological: She displays normal reflexes. No cranial nerve deficit. She exhibits normal muscle tone. Coordination normal.  Skin: No rash noted. No erythema.  Psychiatric: She has a normal mood and affect. Her behavior is normal. Judgment and thought content normal.  B hands: 4th finger is triggering  Lab Results  Component Value Date   WBC 5.6 10/23/2010   HGB 12.9 10/23/2010   HCT 38.8 10/23/2010   PLT 209.0 10/23/2010   GLUCOSE 90 09/11/2011   CHOL 212* 10/23/2010   TRIG 96.0 10/23/2010   HDL 44.40 10/23/2010   LDLDIRECT 149.3 10/23/2010   LDLCALC 132* 05/03/2007   ALT 33 10/23/2010   AST 31 10/23/2010   NA 143 09/11/2011   K 4.8 09/11/2011   CL 103 09/11/2011   CREATININE 0.8 09/11/2011   BUN 13 09/11/2011   CO2 32 09/11/2011   TSH 2.13 02/27/2011     Procedure Note :    Trigger finger tendon Injection:   Indication : Trigger finger 4th L, R   Risks including unsuccessful procedure , bleeding, infection, bruising, skin atrophy and others were explained to the patient in detail as well as the benefits. Informed consent was obtained and signed.   Tthe patient was placed in a comfortable position. 4th R   flexor digitorum tendon was marked and  the skin was prepped with Betadine and alcohol. 1 inch 25-gauge needle was used. The needle was advanced  into the skin down to tendon.  It  was injected with 0.5 mL of 2% lidocaine and 10 mg of Depo-Medrol in a usual fashion.  Band-Aids applied.  Same procedure on the opposite site.   Tolerated well. Complications: None. Good pain relief following the procedure.      Assessment & Plan:

## 2013-08-16 NOTE — Assessment & Plan Note (Signed)
See Rx Risks associated with treatment noncompliance were discussed. Compliance was encouraged.

## 2013-08-16 NOTE — Assessment & Plan Note (Signed)
B hands 4th finger 4/14 Will inject

## 2013-08-16 NOTE — Patient Instructions (Signed)
Vimovo - one a day with food x 2 weeks - then as needed for pain

## 2013-08-16 NOTE — Assessment & Plan Note (Signed)
B 12

## 2013-08-16 NOTE — Assessment & Plan Note (Signed)
Using Depends D/c Lasix Try Myrbetriq

## 2013-08-16 NOTE — Assessment & Plan Note (Signed)
Wt Readings from Last 3 Encounters:  08/16/13 276 lb (125.193 kg)  01/09/13 281 lb (127.461 kg)  01/11/12 308 lb (139.708 kg)

## 2013-08-17 LAB — CBC WITH DIFFERENTIAL/PLATELET
Basophils Absolute: 0 10*3/uL (ref 0.0–0.1)
Basophils Relative: 0.3 % (ref 0.0–3.0)
Eosinophils Absolute: 0.3 10*3/uL (ref 0.0–0.7)
Eosinophils Relative: 3.7 % (ref 0.0–5.0)
HCT: 40.4 % (ref 36.0–46.0)
Hemoglobin: 13 g/dL (ref 12.0–15.0)
Lymphocytes Relative: 34.9 % (ref 12.0–46.0)
Lymphs Abs: 2.6 10*3/uL (ref 0.7–4.0)
MCHC: 32.2 g/dL (ref 30.0–36.0)
MCV: 90.5 fl (ref 78.0–100.0)
Monocytes Absolute: 0.5 10*3/uL (ref 0.1–1.0)
Monocytes Relative: 6.9 % (ref 3.0–12.0)
Neutro Abs: 4.1 10*3/uL (ref 1.4–7.7)
Neutrophils Relative %: 54.2 % (ref 43.0–77.0)
Platelets: 272 10*3/uL (ref 150.0–400.0)
RBC: 4.47 Mil/uL (ref 3.87–5.11)
RDW: 14 % (ref 11.5–14.6)
WBC: 7.5 10*3/uL (ref 4.5–10.5)

## 2013-08-17 LAB — BASIC METABOLIC PANEL
BUN: 11 mg/dL (ref 6–23)
CO2: 28 mEq/L (ref 19–32)
Calcium: 9.6 mg/dL (ref 8.4–10.5)
Chloride: 102 mEq/L (ref 96–112)
Creatinine, Ser: 0.8 mg/dL (ref 0.4–1.2)
GFR: 93.2 mL/min (ref >60.00)
Glucose, Bld: 98 mg/dL (ref 70–99)
Potassium: 3.7 mEq/L (ref 3.5–5.1)
Sodium: 139 mEq/L (ref 135–145)

## 2013-08-17 LAB — LIPID PANEL
Cholesterol: 220 mg/dL — ABNORMAL HIGH (ref 0–200)
HDL: 36.7 mg/dL — ABNORMAL LOW (ref >39.00)
LDL Cholesterol: 142 mg/dL — ABNORMAL HIGH (ref 0–99)
Total CHOL/HDL Ratio: 6
Triglycerides: 205 mg/dL — ABNORMAL HIGH (ref 0.0–149.0)
VLDL: 41 mg/dL — ABNORMAL HIGH (ref 0.0–40.0)

## 2013-08-17 LAB — URINALYSIS
Bilirubin Urine: NEGATIVE
Hgb urine dipstick: NEGATIVE
Ketones, ur: NEGATIVE
Leukocytes, UA: NEGATIVE
Nitrite: NEGATIVE
Specific Gravity, Urine: 1.025 (ref 1.000–1.030)
Total Protein, Urine: NEGATIVE
Urine Glucose: NEGATIVE
Urobilinogen, UA: 0.2 (ref 0.0–1.0)
pH: 5.5 (ref 5.0–8.0)

## 2013-08-17 LAB — HEPATIC FUNCTION PANEL
ALT: 21 U/L (ref 0–35)
AST: 24 U/L (ref 0–37)
Albumin: 4 g/dL (ref 3.5–5.2)
Alkaline Phosphatase: 82 U/L (ref 39–117)
Bilirubin, Direct: 0 mg/dL (ref 0.0–0.3)
Total Bilirubin: 0.4 mg/dL (ref 0.3–1.2)
Total Protein: 7.9 g/dL (ref 6.0–8.3)

## 2013-08-17 LAB — VITAMIN D 25 HYDROXY (VIT D DEFICIENCY, FRACTURES): Vit D, 25-Hydroxy: 29 ng/mL — ABNORMAL LOW (ref 30–89)

## 2013-08-17 LAB — VITAMIN B12: Vitamin B-12: 873 pg/mL (ref 211–911)

## 2013-08-17 LAB — TSH: TSH: 0.66 u[IU]/mL (ref 0.35–5.50)

## 2013-08-20 MED ORDER — METHYLPREDNISOLONE ACETATE 20 MG/ML IJ SUSP: 20.0000 mg | Freq: Once | INTRAMUSCULAR | Status: DC

## 2013-08-25 ENCOUNTER — Ambulatory Visit (HOSPITAL_BASED_OUTPATIENT_CLINIC_OR_DEPARTMENT_OTHER)
Admission: RE | Admit: 2013-08-25 | Discharge: 2013-08-25 | Disposition: A | Discharge status: Home / Self Care | Payer: BC Managed Care – PPO | Source: Ambulatory Visit | Attending: Internal Medicine | Admitting: Internal Medicine

## 2013-08-25 ENCOUNTER — Other Ambulatory Visit: Payer: Self-pay | Admitting: Internal Medicine

## 2013-08-25 DIAGNOSIS — Z1231 Encounter for screening mammogram for malignant neoplasm of breast: Secondary | ICD-10-CM | POA: Insufficient documentation

## 2013-09-19 ENCOUNTER — Encounter: Payer: Self-pay | Admitting: Pulmonary Disease

## 2013-09-19 ENCOUNTER — Encounter (INDEPENDENT_AMBULATORY_CARE_PROVIDER_SITE_OTHER): Payer: Self-pay

## 2013-09-19 ENCOUNTER — Ambulatory Visit (INDEPENDENT_AMBULATORY_CARE_PROVIDER_SITE_OTHER): Payer: BC Managed Care – PPO | Admitting: Pulmonary Disease

## 2013-09-19 VITALS — BP 122/60 | HR 102 | Ht 66.0 in | Wt 286.0 lb

## 2013-09-19 DIAGNOSIS — G4730 Sleep apnea, unspecified: Secondary | ICD-10-CM

## 2013-09-19 NOTE — Assessment & Plan Note (Signed)
Send in the card to [facility] so we can check report & adjust CPAP settings if needed, renew CPAP supplies If wt loss < 250 , consider rpt study Weight loss encouraged, compliance with goal of at least 4-6 hrs every night is the expectation. Advised against medications with sedative side effects Cautioned against driving when sleepy - understanding that sleepiness will vary on a day to day basis

## 2013-09-19 NOTE — Patient Instructions (Signed)
Send in the card to [facility] so we can check report & adjust CPAP settings if needed

## 2013-09-19 NOTE — Progress Notes (Signed)
Subjective:    Patient ID: Tracy Sampson, female    DOB: 1956/05/12, 58 y.o.   MRN: [id number]  HPI 52/F, obese for FU of obstructive sleep apnea diagnosed in 4/03 on autoCPAP.  Rpt titration 04/2008- wt 298 lbs - showed adequate control of events with CPAP 15 cm, now on autoCPAP.  Download 07/2008 >> avg pressure 13 cm  Returns for follow up after 3 years Is maintained on auto CPAP with a full face mask Epworth Sleepiness Score 17/24.  no snoring.Mask ok pressure ok.  Denies excessive daytime somnolence Bedtime is around 10 PM, sleep latency is minimal, she sleeps on her side with 3 pillows, she reports 5-6 nocturnal awakenings and is finally out of bed at 7 AM feeling rested, without dryness of mouth or headaches. Her weight had dropped lower but she is C. Gained some back to weight of about 286 pounds. There is no history suggestive of cataplexy, sleep paralysis or parasomnias She has unfortunately struggled with finding a full time job since being laid off from [employer]  Past Medical History  Diagnosis Date  . OSA (obstructive sleep apnea)     Dr Joya Gaskins  . Depression   . GERD (gastroesophageal reflux disease)   . Hypertension   . Allergic rhinitis   . Morbid obesity   . Sleep apnea     Past Surgical History  Procedure Laterality Date  . Ankle surgery  4/10    rt  . Vaginal hysterectomy      Allergies  Allergen Reactions  . Tribenzor [Olmesartan-Amlodipine-Hctz]     It made me sick    History   Social History  . Marital Status: Single    Spouse Name: N/A    Number of Children: N/A  . Years of Education: N/A   Occupational History  . unemployed     used to work in Therapist, art   Social History Main Topics  . Smoking status: Never Smoker   . Smokeless tobacco: Not on file  . Alcohol Use: No  . Drug Use: No  . Sexual Activity: Not on file   Other Topics Concern  . Not on file   Social History Narrative  . No narrative on file     Family History  Problem Relation Age of Onset  . Hypertension    . Breast cancer Mother   . Cancer Mother     breast  . Colon cancer Maternal Grandmother   . Cancer Maternal Grandmother     colon        Review of Systems  Constitutional: Negative for fever and unexpected weight change.  HENT: Negative for congestion, dental problem, ear pain, nosebleeds, postnasal drip, rhinorrhea, sinus pressure, sneezing, sore throat and trouble swallowing.   Eyes: Negative for redness and itching.  Respiratory: Negative for cough, chest tightness, shortness of breath and wheezing.   Cardiovascular: Negative for palpitations and leg swelling.  Gastrointestinal: Negative for nausea and vomiting.  Genitourinary: Negative for dysuria.  Musculoskeletal: Negative for joint swelling.  Skin: Negative for rash.  Neurological: Negative for headaches.  Hematological: Does not bruise/bleed easily.  Psychiatric/Behavioral: Negative for dysphoric mood. The patient is not nervous/anxious.        Objective:   Physical Exam  Gen. Pleasant, obese, in no distress ENT - no lesions, no post nasal drip Neck: No JVD, no thyromegaly, no carotid bruits Lungs: no use of accessory muscles, no dullness to percussion, decreased without rales or rhonchi  Cardiovascular: Rhythm  regular, heart sounds  normal, no murmurs or gallops, no peripheral edema Musculoskeletal: No deformities, no cyanosis or clubbing , no tremors       Assessment & Plan:

## 2013-09-30 ENCOUNTER — Other Ambulatory Visit: Payer: Self-pay | Admitting: Internal Medicine

## 2013-10-16 ENCOUNTER — Other Ambulatory Visit: Payer: Self-pay | Admitting: *Deleted

## 2013-10-16 MED ORDER — LEVOTHYROXINE SODIUM 175 MCG PO TABS: 175.0000 ug | ORAL_TABLET | Freq: Every day | ORAL | Status: DC

## 2013-10-17 ENCOUNTER — Other Ambulatory Visit: Payer: Self-pay | Admitting: *Deleted

## 2013-10-17 MED ORDER — LEVOTHYROXINE SODIUM 175 MCG PO TABS: 175.0000 ug | ORAL_TABLET | Freq: Every day | ORAL | Status: DC

## 2013-12-14 ENCOUNTER — Other Ambulatory Visit: Payer: Self-pay | Admitting: Internal Medicine

## 2013-12-15 ENCOUNTER — Encounter: Payer: Self-pay | Admitting: Gastroenterology

## 2013-12-26 ENCOUNTER — Encounter: Payer: Self-pay | Admitting: Internal Medicine

## 2013-12-26 ENCOUNTER — Ambulatory Visit (INDEPENDENT_AMBULATORY_CARE_PROVIDER_SITE_OTHER): Payer: BC Managed Care – PPO | Admitting: Internal Medicine

## 2013-12-26 VITALS — BP 149/86 | HR 67 | Temp 96.8°F | Wt 289.0 lb

## 2013-12-26 DIAGNOSIS — F3289 Other specified depressive episodes: Secondary | ICD-10-CM

## 2013-12-26 DIAGNOSIS — F329 Major depressive disorder, single episode, unspecified: Secondary | ICD-10-CM

## 2013-12-26 DIAGNOSIS — K429 Umbilical hernia without obstruction or gangrene: Secondary | ICD-10-CM

## 2013-12-26 DIAGNOSIS — I10 Essential (primary) hypertension: Secondary | ICD-10-CM

## 2013-12-26 DIAGNOSIS — J309 Allergic rhinitis, unspecified: Secondary | ICD-10-CM

## 2013-12-26 DIAGNOSIS — M6530 Trigger finger, unspecified finger: Secondary | ICD-10-CM

## 2013-12-26 NOTE — Assessment & Plan Note (Signed)
Continue with current prescription therapy as reflected on the Med list.  

## 2013-12-26 NOTE — Assessment & Plan Note (Signed)
Better on Allegra prn

## 2013-12-26 NOTE — Assessment & Plan Note (Signed)
Worse Diet discussed 

## 2013-12-26 NOTE — Progress Notes (Signed)
Pre visit review using our clinic review tool, if applicable. No additional management support is needed unless otherwise documented below in the visit note. 

## 2013-12-26 NOTE — Assessment & Plan Note (Signed)
8/15 enlarging R-sided Will consult Dr Hulen Skains

## 2013-12-26 NOTE — Assessment & Plan Note (Signed)
Resolved

## 2013-12-26 NOTE — Progress Notes (Signed)
   Subjective:    HPI  The patient presents for a follow-up of  chronic hypertension, chronic dyslipidemia, type 2 pre-diabetes controlled with medicines, diet. She did not feel good on Tribenzor - no specific reaction.Marland KitchenMarland KitchenShe is graduated this May with SW degree  - working part time  [employer] from Last 3 Encounters:  12/26/13 289 lb (131.09 kg)  09/19/13 286 lb (129.729 kg)  08/16/13 276 lb (125.193 kg)   BP Readings from Last 3 Encounters:  12/26/13 149/86  09/19/13 122/60  08/16/13 156/82      Review of Systems  Constitutional: Negative for chills, activity change, appetite change, fatigue and unexpected weight change.  HENT: Negative for congestion, mouth sores and sinus pressure.   Eyes: Negative for visual disturbance.  Respiratory: Negative for cough and chest tightness.   Gastrointestinal: Negative for nausea and abdominal pain.  Genitourinary: Negative for frequency, difficulty urinating and vaginal pain.  Musculoskeletal: Negative for back pain and gait problem.  Skin: Negative for pallor and rash.  Neurological: Negative for dizziness, tremors, weakness, numbness and headaches.  Psychiatric/Behavioral: Negative for confusion and sleep disturbance.       Objective:   Physical Exam  Constitutional: She appears well-developed. No distress.  Obese   HENT:  Head: Normocephalic.  Right Ear: External ear normal.  Left Ear: External ear normal.  Nose: Nose normal.  Mouth/Throat: Oropharynx is clear and moist.  Eyes: Conjunctivae are normal. Pupils are equal, round, and reactive to light. Right eye exhibits no discharge. Left eye exhibits no discharge.  Neck: Normal range of motion. Neck supple. No JVD present. No tracheal deviation present. No thyromegaly present.  Cardiovascular: Normal rate, regular rhythm and normal heart sounds.   Pulmonary/Chest: No stridor. No respiratory distress. She has no wheezes.  Abdominal: Soft. Bowel sounds are normal. She exhibits  no distension and no mass. There is no tenderness. There is no rebound and no guarding.  Musculoskeletal: She exhibits no edema and no tenderness.  Lymphadenopathy:    She has no cervical adenopathy.  Neurological: She displays normal reflexes. No cranial nerve deficit. She exhibits normal muscle tone. Coordination normal.  Skin: No rash noted. No erythema.  Psychiatric: She has a normal mood and affect. Her behavior is normal. Judgment and thought content normal.  B hands: 4th finger is not triggering Large umbilical hernia  Lab Results  Component Value Date   WBC 7.5 08/16/2013   HGB 13.0 08/16/2013   HCT 40.4 08/16/2013   PLT 272.0 08/16/2013   GLUCOSE 98 08/16/2013   CHOL 220* 08/16/2013   TRIG 205.0* 08/16/2013   HDL 36.70* 08/16/2013   LDLDIRECT 149.3 10/23/2010   LDLCALC 142* 08/16/2013   ALT 21 08/16/2013   AST 24 08/16/2013   NA 139 08/16/2013   K 3.7 08/16/2013   CL 102 08/16/2013   CREATININE 0.8 08/16/2013   BUN 11 08/16/2013   CO2 28 08/16/2013   TSH 0.66 08/16/2013   HGBA1C 5.5 08/16/2013       Assessment & Plan:

## 2013-12-26 NOTE — Assessment & Plan Note (Signed)
BP Readings from Last 3 Encounters:  12/26/13 149/86  09/19/13 122/60  08/16/13 156/82  Worse: wt loss, NAS diet

## 2013-12-27 ENCOUNTER — Telehealth: Payer: Self-pay | Admitting: Internal Medicine

## 2013-12-27 NOTE — Telephone Encounter (Signed)
Relevant patient education assigned to patient using Emmi. ° °

## 2014-02-27 ENCOUNTER — Ambulatory Visit (INDEPENDENT_AMBULATORY_CARE_PROVIDER_SITE_OTHER): Payer: BC Managed Care – PPO | Admitting: General Surgery

## 2014-04-26 ENCOUNTER — Other Ambulatory Visit: Payer: Self-pay | Admitting: Internal Medicine

## 2014-04-27 ENCOUNTER — Ambulatory Visit (INDEPENDENT_AMBULATORY_CARE_PROVIDER_SITE_OTHER): Payer: BC Managed Care – PPO | Admitting: Internal Medicine

## 2014-04-27 ENCOUNTER — Encounter: Payer: Self-pay | Admitting: Internal Medicine

## 2014-04-27 DIAGNOSIS — K219 Gastro-esophageal reflux disease without esophagitis: Secondary | ICD-10-CM

## 2014-04-27 DIAGNOSIS — E038 Other specified hypothyroidism: Secondary | ICD-10-CM

## 2014-04-27 DIAGNOSIS — E034 Atrophy of thyroid (acquired): Secondary | ICD-10-CM

## 2014-04-27 MED ORDER — DOXAZOSIN MESYLATE 4 MG PO TABS: 4.0000 mg | ORAL_TABLET | Freq: Every day | ORAL | Status: DC

## 2014-04-27 NOTE — Progress Notes (Signed)
   Subjective:    HPI  The patient presents for a follow-up of  chronic hypertension, chronic dyslipidemia, type 2 pre-diabetes controlled with medicines, diet. She did not feel good on Tribenzor - no specific reaction.Marland KitchenMarland KitchenShe is graduated this May with SW degree  - working full time  [employer] Readings from Last 3 Encounters:  04/27/14 292 lb (132.45 kg)  12/26/13 289 lb (131.09 kg)  09/19/13 286 lb (129.729 kg)   BP Readings from Last 3 Encounters:  04/27/14 140/90  12/26/13 149/86  09/19/13 122/60      Review of Systems  Constitutional: Negative for chills, activity change, appetite change, fatigue and unexpected weight change.  HENT: Negative for congestion, mouth sores and sinus pressure.   Eyes: Negative for visual disturbance.  Respiratory: Negative for cough and chest tightness.   Gastrointestinal: Negative for nausea and abdominal pain.  Genitourinary: Negative for frequency, difficulty urinating and vaginal pain.  Musculoskeletal: Negative for back pain and gait problem.  Skin: Negative for pallor and rash.  Neurological: Negative for dizziness, tremors, weakness, numbness and headaches.  Psychiatric/Behavioral: Negative for confusion and sleep disturbance.       Objective:   Physical Exam  Constitutional: She appears well-developed. No distress.  HENT:  Head: Normocephalic.  Right Ear: External ear normal.  Left Ear: External ear normal.  Nose: Nose normal.  Mouth/Throat: Oropharynx is clear and moist.  Eyes: Conjunctivae are normal. Pupils are equal, round, and reactive to light. Right eye exhibits no discharge. Left eye exhibits no discharge.  Neck: Normal range of motion. Neck supple. No JVD present. No tracheal deviation present. No thyromegaly present.  Cardiovascular: Normal rate, regular rhythm and normal heart sounds.   Pulmonary/Chest: No stridor. No respiratory distress. She has no wheezes.  Abdominal: Soft. Bowel sounds are normal. She exhibits no  distension and no mass. There is no tenderness. There is no rebound and no guarding.  Musculoskeletal: She exhibits no edema or tenderness.  Lymphadenopathy:    She has no cervical adenopathy.  Neurological: She displays normal reflexes. No cranial nerve deficit. She exhibits normal muscle tone. Coordination normal.  Skin: No rash noted. No erythema.  Psychiatric: She has a normal mood and affect. Her behavior is normal. Judgment and thought content normal.  B hands: 4th finger is not triggering Large umbilical hernia Obese  Lab Results  Component Value Date   WBC 7.5 08/16/2013   HGB 13.0 08/16/2013   HCT 40.4 08/16/2013   PLT 272.0 08/16/2013   GLUCOSE 98 08/16/2013   CHOL 220* 08/16/2013   TRIG 205.0* 08/16/2013   HDL 36.70* 08/16/2013   LDLDIRECT 149.3 10/23/2010   LDLCALC 142* 08/16/2013   ALT 21 08/16/2013   AST 24 08/16/2013   NA 139 08/16/2013   K 3.7 08/16/2013   CL 102 08/16/2013   CREATININE 0.8 08/16/2013   BUN 11 08/16/2013   CO2 28 08/16/2013   TSH 0.66 08/16/2013   HGBA1C 5.5 08/16/2013       Assessment & Plan:  Patient ID: Tracy Sampson, female   DOB: 1956/01/07, 58 y.o.   MRN: [id number]

## 2014-04-27 NOTE — Progress Notes (Signed)
Pre visit review using our clinic review tool, if applicable. No additional management support is needed unless otherwise documented below in the visit note. 

## 2014-04-27 NOTE — Assessment & Plan Note (Signed)
Continue with current prescription therapy as reflected on the Med list.  

## 2014-04-27 NOTE — Assessment & Plan Note (Signed)
Wt Readings from Last 3 Encounters:  04/27/14 292 lb (132.45 kg)  12/26/13 289 lb (131.09 kg)  09/19/13 286 lb (129.729 kg)

## 2014-06-11 ENCOUNTER — Other Ambulatory Visit: Payer: Self-pay | Admitting: Internal Medicine

## 2014-07-11 ENCOUNTER — Encounter: Payer: Self-pay | Admitting: Obstetrics & Gynecology

## 2014-07-11 ENCOUNTER — Ambulatory Visit (INDEPENDENT_AMBULATORY_CARE_PROVIDER_SITE_OTHER): Payer: BLUE CROSS/BLUE SHIELD | Admitting: Obstetrics & Gynecology

## 2014-07-11 VITALS — BP 137/78 | HR 95 | Resp 17 | Ht 66.0 in | Wt 296.0 lb

## 2014-07-11 DIAGNOSIS — Z124 Encounter for screening for malignant neoplasm of cervix: Secondary | ICD-10-CM | POA: Diagnosis not present

## 2014-07-11 DIAGNOSIS — Z0000 Encounter for general adult medical examination without abnormal findings: Secondary | ICD-10-CM

## 2014-07-11 DIAGNOSIS — N9489 Other specified conditions associated with female genital organs and menstrual cycle: Secondary | ICD-10-CM | POA: Diagnosis not present

## 2014-07-11 DIAGNOSIS — N898 Other specified noninflammatory disorders of vagina: Secondary | ICD-10-CM | POA: Diagnosis not present

## 2014-07-11 DIAGNOSIS — Z1151 Encounter for screening for human papillomavirus (HPV): Secondary | ICD-10-CM | POA: Diagnosis not present

## 2014-07-11 MED ORDER — METRONIDAZOLE 500 MG PO TABS: 500.0000 mg | ORAL_TABLET | Freq: Two times a day (BID) | ORAL | Status: DC

## 2014-07-11 NOTE — Progress Notes (Signed)
   Subjective:    Patient ID: Tracy Sampson, female    DOB: 06-09-1955, 59 y.o.   MRN: [id number]  HPI 59 yo S AAP0 here today with the complaint of vaginal odor for "years". She has not been treated. She is very concerned as her coworkers can smell it. She douched 3 days ago. She    Review of Systems She had a hysterectomy.     Objective:   Physical Exam  Morbidly obese lady, NAD No odor noted What appears to be her cervix is at the introitus (?supracervical hysterectomy) I did a pap smear Small amount of white vaginal discharge Bimanual exam reveals no masses      Assessment & Plan:  Perceived odor- wet prep sent I will treat her empirically with flagyl She will RTC in 2-3 with NO douching ROI for hysterectomy records

## 2014-07-12 ENCOUNTER — Telehealth: Payer: Self-pay | Admitting: *Deleted

## 2014-07-12 LAB — WET PREP, GENITAL
Clue Cells Wet Prep HPF POC: NONE SEEN
Trich, Wet Prep: NONE SEEN
WBC, Wet Prep HPF POC: NONE SEEN
Yeast Wet Prep HPF POC: NONE SEEN

## 2014-07-12 NOTE — Telephone Encounter (Signed)
Pt notified of normal wet prep.  If she continues to be symptomatic with the c/o of smelling bad, she will return to office.

## 2014-07-13 LAB — CYTOLOGY - PAP

## 2014-08-08 ENCOUNTER — Ambulatory Visit (INDEPENDENT_AMBULATORY_CARE_PROVIDER_SITE_OTHER): Payer: BLUE CROSS/BLUE SHIELD | Admitting: Obstetrics & Gynecology

## 2014-08-08 ENCOUNTER — Encounter: Payer: Self-pay | Admitting: Obstetrics & Gynecology

## 2014-08-08 VITALS — BP 157/81 | HR 80 | Resp 17 | Ht 66.0 in | Wt 296.0 lb

## 2014-08-08 DIAGNOSIS — R32 Unspecified urinary incontinence: Secondary | ICD-10-CM

## 2014-08-08 MED ORDER — METRONIDAZOLE 500 MG PO TABS: 500.0000 mg | ORAL_TABLET | Freq: Two times a day (BID) | ORAL | Status: DC

## 2014-08-08 NOTE — Progress Notes (Signed)
   Subjective:    Patient ID: Tracy Sampson, female    DOB: 11-Aug-1955, 59 y.o.   MRN: [id number]  HPI  Tracy Sampson is here for follow up of a vaginal odor. She took the flagyl and has not had an odor since.   She tells me that she is aware of her elevated lipids and is already enrolled in a program at work to decrease her lipids. I also advised her to let her FP know about this.  She is having some urinary incontinence issues.  Review of Systems     Objective:   Physical Exam  WNWHBFNAD Breathing and ambulating normally Abd- morbidly obese      Assessment & Plan:  Incontinence issues- refer to urology Rec weight loss I will give her refills of flagyl to be used prn

## 2014-09-20 ENCOUNTER — Other Ambulatory Visit: Payer: Self-pay | Admitting: Internal Medicine

## 2014-09-20 DIAGNOSIS — Z1231 Encounter for screening mammogram for malignant neoplasm of breast: Secondary | ICD-10-CM

## 2014-09-21 ENCOUNTER — Ambulatory Visit (HOSPITAL_BASED_OUTPATIENT_CLINIC_OR_DEPARTMENT_OTHER)
Admission: RE | Admit: 2014-09-21 | Discharge: 2014-09-21 | Disposition: A | Discharge status: Home / Self Care | Payer: BLUE CROSS/BLUE SHIELD | Source: Ambulatory Visit | Attending: Internal Medicine | Admitting: Internal Medicine

## 2014-09-21 DIAGNOSIS — Z1231 Encounter for screening mammogram for malignant neoplasm of breast: Secondary | ICD-10-CM | POA: Diagnosis present

## 2014-10-25 ENCOUNTER — Telehealth: Payer: Self-pay | Admitting: Internal Medicine

## 2014-10-25 NOTE — Telephone Encounter (Signed)
Patient would like for you to know that she had to cancel her appt because she doesn't have the money.

## 2014-10-25 NOTE — Telephone Encounter (Signed)
Noted. Sorry! Thx

## 2014-10-29 ENCOUNTER — Encounter: Payer: BC Managed Care – PPO | Admitting: Internal Medicine

## 2014-11-15 ENCOUNTER — Other Ambulatory Visit: Payer: Self-pay | Admitting: Internal Medicine

## 2014-11-15 NOTE — Telephone Encounter (Signed)
Patient need a refill of Levothyroxine, sent to [facility] on [address] [employer]

## 2014-12-04 ENCOUNTER — Encounter: Payer: Self-pay | Admitting: Internal Medicine

## 2014-12-04 ENCOUNTER — Ambulatory Visit (INDEPENDENT_AMBULATORY_CARE_PROVIDER_SITE_OTHER): Payer: BLUE CROSS/BLUE SHIELD | Admitting: Internal Medicine

## 2014-12-04 ENCOUNTER — Telehealth: Payer: Self-pay | Admitting: Internal Medicine

## 2014-12-04 VITALS — BP 150/100 | HR 64 | Ht 66.0 in | Wt 298.0 lb

## 2014-12-04 DIAGNOSIS — I10 Essential (primary) hypertension: Secondary | ICD-10-CM

## 2014-12-04 DIAGNOSIS — K921 Melena: Secondary | ICD-10-CM

## 2014-12-04 DIAGNOSIS — Z23 Encounter for immunization: Secondary | ICD-10-CM

## 2014-12-04 DIAGNOSIS — L748 Other eccrine sweat disorders: Secondary | ICD-10-CM | POA: Diagnosis not present

## 2014-12-04 DIAGNOSIS — L750 Bromhidrosis: Secondary | ICD-10-CM

## 2014-12-04 MED ORDER — DOXAZOSIN MESYLATE 4 MG PO TABS: 4.0000 mg | ORAL_TABLET | Freq: Every day | ORAL | Status: DC

## 2014-12-04 MED ORDER — AMLODIPINE BESYLATE 5 MG PO TABS: 5.0000 mg | ORAL_TABLET | Freq: Every day | ORAL | Status: DC

## 2014-12-04 MED ORDER — SERTRALINE HCL 100 MG PO TABS
100.0000 mg | ORAL_TABLET | Freq: Every day | ORAL | Status: DC
Start: 2014-12-04 — End: 2015-06-06

## 2014-12-04 MED ORDER — LEVOTHYROXINE SODIUM 175 MCG PO TABS: 175.0000 ug | ORAL_TABLET | Freq: Every day | ORAL | Status: DC

## 2014-12-04 NOTE — Progress Notes (Signed)
Subjective:  Patient ID: Tracy Sampson, female    DOB: 11-02-1955  Age: 59 y.o. MRN: [id number]  CC: Annual Exam   HPI Tracy Sampson presents for blood in stool, c/o ?"fishy" odor from behind (subjective). F/u HTN. Labs were ok at work  Outpatient Prescriptions Prior to Visit  Medication Sig Dispense Refill  . BESIVANCE 0.6 % SUSP   0  . DUREZOL 0.05 % EMUL   0  . fexofenadine (ALLEGRA) 180 MG tablet Take 180 mg by mouth daily as needed.      . latanoprost (XALATAN) 0.005 % ophthalmic solution Place 1 drop into both eyes at bedtime.  0  . metroNIDAZOLE (FLAGYL) 500 MG tablet Take 1 tablet (500 mg total) by mouth 2 (two) times daily. 14 tablet 6  . PROLENSA 0.07 % SOLN   0  . vitamin B-12 (CYANOCOBALAMIN) 500 MCG tablet Take 500 mcg by mouth daily.    [name] amLODipine (NORVASC) 5 MG tablet TAKE 1 TABLET (5 MG TOTAL) BY MOUTH DAILY. 90 tablet 2  . doxazosin (CARDURA) 4 MG tablet Take 1 tablet (4 mg total) by mouth daily. 30 tablet 11  . levothyroxine (SYNTHROID, LEVOTHROID) 175 MCG tablet take 1 tablet by mouth once daily 90 tablet 0  . sertraline (ZOLOFT) 100 MG tablet TAKE 1 TABLET DAILY 90 tablet 3   Facility-Administered Medications Prior to Visit  Medication Dose Route Frequency Provider Last Rate Last Dose  . methylPREDNISolone acetate (DEPO-MEDROL) 20 MG/ML injection 20 mg  20 mg Intra-Lesional Once Aleksei Plotnikov V, MD        ROS Review of Systems  Constitutional: Positive for unexpected weight change. Negative for chills, activity change, appetite change and fatigue.  HENT: Negative for congestion, mouth sores and sinus pressure.   Eyes: Negative for visual disturbance.  Respiratory: Negative for cough and chest tightness.   Gastrointestinal: Positive for blood in stool. Negative for nausea, abdominal pain, diarrhea and constipation.  Genitourinary: Negative for frequency, vaginal discharge, difficulty urinating, vaginal pain and menstrual problem.    Musculoskeletal: Negative for back pain and gait problem.  Skin: Negative for pallor and rash.  Neurological: Negative for dizziness, tremors, weakness, numbness and headaches.  Psychiatric/Behavioral: Negative for suicidal ideas, confusion and sleep disturbance. The patient is nervous/anxious.     Objective:  BP 150/100 mmHg  Pulse 64  Ht 5\' 6"  (1.676 m)  Wt 298 lb (135.172 kg)  BMI 48.12 kg/m2  SpO2 97%  BP Readings from Last 3 Encounters:  12/04/14 150/100  08/08/14 157/81  07/11/14 137/78    Wt Readings from Last 3 Encounters:  12/04/14 298 lb (135.172 kg)  08/08/14 296 lb (134.265 kg)  07/11/14 296 lb (134.265 kg)    Physical Exam  Constitutional: She appears well-developed. No distress.  Obese  HENT:  Head: Normocephalic.  Right Ear: External ear normal.  Left Ear: External ear normal.  Nose: Nose normal.  Mouth/Throat: Oropharynx is clear and moist.  Eyes: Conjunctivae are normal. Pupils are equal, round, and reactive to light. Right eye exhibits no discharge. Left eye exhibits no discharge.  Neck: Normal range of motion. Neck supple. No JVD present. No tracheal deviation present. No thyromegaly present.  Cardiovascular: Normal rate, regular rhythm and normal heart sounds.   Pulmonary/Chest: No stridor. No respiratory distress. She has no wheezes.  Abdominal: Soft. Bowel sounds are normal. She exhibits no distension and no mass. There is no tenderness. There is no rebound and no guarding.  Musculoskeletal: She exhibits no  edema or tenderness.  Lymphadenopathy:    She has no cervical adenopathy.  Neurological: She displays normal reflexes. No cranial nerve deficit. She exhibits normal muscle tone. Coordination normal.  Skin: No rash noted. No erythema.  Psychiatric: She has a normal mood and affect. Her behavior is normal. Judgment and thought content normal.  I do not smell any offensive odor  Lab Results  Component Value Date   WBC 7.5 08/16/2013   HGB  13.0 08/16/2013   HCT 40.4 08/16/2013   PLT 272.0 08/16/2013   GLUCOSE 98 08/16/2013   CHOL 220* 08/16/2013   TRIG 205.0* 08/16/2013   HDL 36.70* 08/16/2013   LDLDIRECT 149.3 10/23/2010   LDLCALC 142* 08/16/2013   ALT 21 08/16/2013   AST 24 08/16/2013   NA 139 08/16/2013   K 3.7 08/16/2013   CL 102 08/16/2013   CREATININE 0.8 08/16/2013   BUN 11 08/16/2013   CO2 28 08/16/2013   TSH 0.66 08/16/2013   HGBA1C 5.5 08/16/2013    Mm Digital Screening Bilateral  09/21/2014   CLINICAL DATA:  Screening.  EXAM: DIGITAL SCREENING BILATERAL MAMMOGRAM WITH CAD  COMPARISON:  Previous exam(s).  ACR Breast Density Category a: The breast tissue is almost entirely fatty.  FINDINGS: There are no findings suspicious for malignancy. Images were processed with CAD.  IMPRESSION: No mammographic evidence of malignancy. A result letter of this screening mammogram will be mailed directly to the patient.  RECOMMENDATION: Screening mammogram in one year. (Code:SM-B-01Y)  BI-RADS CATEGORY  1: Negative.   Electronically Signed   By: Nolon Nations M.D.   On: 09/21/2014 15:02    Assessment & Plan:   Tracy Sampson was seen today for annual exam.  Diagnoses and all orders for this visit:  Essential hypertension  OBESITY, MORBID  Blood in stool Orders: -     Ambulatory referral to Gastroenterology  Body odor  Need for Tdap vaccination Orders: -     Tdap vaccine greater than or equal to 7yo IM  Other orders -     sertraline (ZOLOFT) 100 MG tablet; Take 1 tablet (100 mg total) by mouth daily. -     amLODipine (NORVASC) 5 MG tablet; Take 1 tablet (5 mg total) by mouth daily. -     doxazosin (CARDURA) 4 MG tablet; Take 1 tablet (4 mg total) by mouth daily. -     levothyroxine (SYNTHROID, LEVOTHROID) 175 MCG tablet; Take 1 tablet (175 mcg total) by mouth daily.   I have changed Ms. Tracy Sampson's sertraline and levothyroxine. I am also having her maintain her fexofenadine, vitamin B-12, latanoprost, DUREZOL,  PROLENSA, BESIVANCE, metroNIDAZOLE, amLODipine, and doxazosin. We will continue to administer methylPREDNISolone acetate.  Meds ordered this encounter  Medications  . sertraline (ZOLOFT) 100 MG tablet    Sig: Take 1 tablet (100 mg total) by mouth daily.    Dispense:  90 tablet    Refill:  3  . amLODipine (NORVASC) 5 MG tablet    Sig: Take 1 tablet (5 mg total) by mouth daily.    Dispense:  90 tablet    Refill:  3    No refills available  . doxazosin (CARDURA) 4 MG tablet    Sig: Take 1 tablet (4 mg total) by mouth daily.    Dispense:  90 tablet    Refill:  3  . levothyroxine (SYNTHROID, LEVOTHROID) 175 MCG tablet    Sig: Take 1 tablet (175 mcg total) by mouth daily.    Dispense:  90 tablet  Refill:  3     Follow-up: Return in about 6 months (around 06/06/2015) for Wellness Exam. Get me labs from work Walker Kehr, MD

## 2014-12-04 NOTE — Assessment & Plan Note (Signed)
Discussed Loose wt 

## 2014-12-04 NOTE — Assessment & Plan Note (Signed)
7/16 new Last colon 2007 Gi ref Dr Deatra Ina

## 2014-12-04 NOTE — Assessment & Plan Note (Signed)
Subjective Finishing Flagyl Given Align

## 2014-12-04 NOTE — Telephone Encounter (Signed)
Pharmacy informed: Rx is for # 90 with 3 refills. The "no refills available" was a comment from the pharmacy and to disregard the comment.

## 2014-12-04 NOTE — Progress Notes (Signed)
Pre visit review using our clinic review tool, if applicable. No additional management support is needed unless otherwise documented below in the visit note. 

## 2014-12-04 NOTE — Assessment & Plan Note (Signed)
On Amlodipine, cardura  Wt loss  NAS diet

## 2014-12-04 NOTE — Telephone Encounter (Signed)
[facility] on [address] In [place] called regarding the prescription for amLODipine (NORVASC) 5 MG tablet [[id number]. Prescription calls for 3 refills, but on the comment note it states no refills available. Please call pharmacy to confirm

## 2014-12-05 ENCOUNTER — Other Ambulatory Visit: Payer: Self-pay | Admitting: Pulmonary Disease

## 2014-12-05 ENCOUNTER — Encounter: Payer: Self-pay | Admitting: Gastroenterology

## 2014-12-05 DIAGNOSIS — G4733 Obstructive sleep apnea (adult) (pediatric): Secondary | ICD-10-CM

## 2014-12-06 ENCOUNTER — Encounter: Payer: Self-pay | Admitting: Adult Health

## 2014-12-06 ENCOUNTER — Ambulatory Visit (INDEPENDENT_AMBULATORY_CARE_PROVIDER_SITE_OTHER): Payer: BLUE CROSS/BLUE SHIELD | Admitting: Adult Health

## 2014-12-06 VITALS — BP 148/88 | HR 75 | Temp 98.1°F | Ht 66.0 in | Wt 298.0 lb

## 2014-12-06 DIAGNOSIS — G4733 Obstructive sleep apnea (adult) (pediatric): Secondary | ICD-10-CM | POA: Diagnosis not present

## 2014-12-06 DIAGNOSIS — G4730 Sleep apnea, unspecified: Secondary | ICD-10-CM | POA: Diagnosis not present

## 2014-12-06 NOTE — Progress Notes (Signed)
Reviewed & agree with plan  

## 2014-12-06 NOTE — Assessment & Plan Note (Signed)
Wear CPAP At bedtime  -every night  Goal is at least 4-6 hr each night  Work on weight loss.  Do not drive if sleepy.  Order sent for new mask and supplies  Download in 6 weeks .  follow up Dr. Elsworth Soho  In 6 months  and As needed

## 2014-12-06 NOTE — Progress Notes (Signed)
   Subjective:    Patient ID: Tracy Sampson, female    DOB: July 15, 1955, 59 y.o.   MRN: [id number]  HPI 58/F, obese for FU of obstructive sleep apnea diagnosed in 4/03 on autoCPAP.  Rpt titration 04/2008- wt 298 lbs - showed adequate control of events with CPAP 15 cm, now on autoCPAP.  Download 07/2008 >> avg pressure 13 cm  09/2013  Returns for follow up after 3 years Is maintained on auto CPAP with a full face mask Epworth Sleepiness Score 17/24.  no snoring.Mask ok pressure ok.  Denies excessive daytime somnolence Bedtime is around 10 PM, sleep latency is minimal, she sleeps on her side with 3 pillows, she reports 5-6 nocturnal awakenings and is finally out of bed at 7 AM feeling rested, without dryness of mouth or headaches. Her weight had dropped lower but she is C. Gained some back to weight of about 286 pounds. There is no history suggestive of cataplexy, sleep paralysis or parasomnias She has unfortunately struggled with finding a full time job since being laid off from [employer]   12/06/2014 Follow up OSA  Pt returns for follow up for sleep apnea.  On  CPAP At bedtime  . Admits she is not wearing  Download is pending .  Requests new machine w/ discussed noncompliance and insurance coverage.  Says she does not wear because she forgets or is too tired.  Agrees to restarting and imrpoved compliance with follow up in 6 months.  Has daytime sleepiness. We discussed complications of untreated OSA.  Denies chest pain, orthopnea, edema or fever.  Discussed wt loss and not driving if sleepy.      Review of Systems  Constitutional: Negative for fever and unexpected weight change.  HENT: Negative for congestion, dental problem, ear pain, nosebleeds, postnasal drip, rhinorrhea, sinus pressure, sneezing, sore throat and trouble swallowing.   Eyes: Negative for redness and itching.  Respiratory: Negative for cough, chest tightness, shortness of breath and wheezing.    Cardiovascular: Negative for palpitations and leg swelling.  Gastrointestinal: Negative for nausea and vomiting.  Genitourinary: Negative for dysuria.  Musculoskeletal: Negative for joint swelling.  Skin: Negative for rash.  Neurological: Negative for headaches.  Hematological: Does not bruise/bleed easily.  Psychiatric/Behavioral: Negative for dysphoric mood. The patient is not nervous/anxious.        Objective:   Physical Exam  Gen. Pleasant, obese, in no distress ENT - no lesions, no post nasal drip Neck: No JVD, no thyromegaly, no carotid bruits Lungs: no use of accessory muscles, no dullness to percussion, decreased without rales or rhonchi  Cardiovascular: Rhythm regular, heart sounds  normal, no murmurs or gallops, no peripheral edema Musculoskeletal: No deformities, no cyanosis or clubbing , no tremors       Assessment & Plan:

## 2014-12-06 NOTE — Patient Instructions (Addendum)
Wear CPAP At bedtime  -every night  Goal is at least 4-6 hr each night  Work on weight loss.  Do not drive if sleepy.  Order sent for new mask and supplies  Download in 6 weeks .  follow up Dr. Elsworth Soho  In 6 months  and As needed

## 2014-12-06 NOTE — Assessment & Plan Note (Signed)
Wt loss  

## 2014-12-17 ENCOUNTER — Telehealth: Payer: Self-pay | Admitting: Internal Medicine

## 2014-12-17 DIAGNOSIS — K429 Umbilical hernia without obstruction or gangrene: Secondary | ICD-10-CM

## 2014-12-17 NOTE — Telephone Encounter (Signed)
Patient was in last year with a hernia and was referred to surgeon. Tracy Sampson has not got that taken care of yet. Surgery center states Tracy Sampson will need a new referral. Will Tracy Sampson need to make an appointment or will Dr. Alain Marion be able to send another referral over without an appointment. Please advise

## 2014-12-17 NOTE — Telephone Encounter (Signed)
Pt was last seen by PCP 12/04/14. Will forward to PCP to advise as to whether another OV is needed or if referral can be re entered. Note  ** PCP is out of office all week of 12/17/14 **

## 2014-12-21 NOTE — Telephone Encounter (Signed)
Done. Thx.

## 2015-02-06 ENCOUNTER — Encounter: Payer: Self-pay | Admitting: Gastroenterology

## 2015-02-06 ENCOUNTER — Ambulatory Visit (INDEPENDENT_AMBULATORY_CARE_PROVIDER_SITE_OTHER): Payer: Managed Care, Other (non HMO) | Admitting: Gastroenterology

## 2015-02-06 VITALS — BP 130/90 | HR 68 | Ht 66.0 in | Wt 297.0 lb

## 2015-02-06 DIAGNOSIS — K625 Hemorrhage of anus and rectum: Secondary | ICD-10-CM | POA: Insufficient documentation

## 2015-02-06 NOTE — Progress Notes (Signed)
_                                                                                                                History of Present Illness:  Tracy Sampson is a pleasant 59 year old 59 female referred at the request of Dr. Alain Marion for evaluation of rectal bleeding.  For several weeks she is noting intermittent rectal bleeding consisting of bright red blood on the toilet tissue.  Bleeding has varied from spotting to moderate amounts of blood.  She denies rectal or abdominal pain or change in bowel habits.  Last colonoscopy in 2007 was unremarkable.   Past Medical History  Diagnosis Date  . OSA (obstructive sleep apnea)     Dr Joya Gaskins  . Depression   . GERD (gastroesophageal reflux disease)   . Hypertension   . Allergic rhinitis   . Morbid obesity   . Sleep apnea   . Glaucoma    Past Surgical History  Procedure Laterality Date  . Ankle surgery  4/10    rt  . Vaginal hysterectomy     family history includes Breast cancer in her mother; Cancer in her maternal grandmother and mother; Colon cancer in her maternal grandmother; Hypertension in an other family member. Current Outpatient Prescriptions  Medication Sig Dispense Refill  . amLODipine (NORVASC) 5 MG tablet Take 1 tablet (5 mg total) by mouth daily. 90 tablet 3  . doxazosin (CARDURA) 4 MG tablet Take 1 tablet (4 mg total) by mouth daily. 90 tablet 3  . levothyroxine (SYNTHROID, LEVOTHROID) 175 MCG tablet Take 1 tablet (175 mcg total) by mouth daily. 90 tablet 3  . sertraline (ZOLOFT) 100 MG tablet Take 1 tablet (100 mg total) by mouth daily. 90 tablet 3  . tobramycin (TOBREX) 0.3 % ophthalmic solution instill 1 drop into left eye four times a day BEGIN 1 DAY PRIOR T...  (REFER TO PRESCRIPTION NOTES).  0  . vitamin B-12 (CYANOCOBALAMIN) 500 MCG tablet Take 500 mcg by mouth daily.     No current facility-administered medications for this visit.   Allergies as of 02/06/2015 - Review Complete 02/06/2015    Allergen Reaction Noted  . Tribenzor [olmesartan-amlodipine-hctz]  06/12/2011    reports that she has never smoked. She has never used smokeless tobacco. She reports that she does not drink alcohol or use illicit drugs.   Review of Systems: Pertinent positive and negative review of systems were noted in the above HPI section. All other review of systems were otherwise negative.  Vital signs were reviewed in today's medical record Physical Exam: General: Well developed , well nourished, no acute distress Skin: anicteric Head: Normocephalic and atraumatic Eyes:  sclerae anicteric, EOMI Ears: Normal auditory acuity Mouth: No deformity or lesions Neck: Supple, no masses or thyromegaly Lymph Nodes: no lymphadenopathy Lungs: Clear throughout to auscultation Heart: Regular rate and rhythm; no murmurs, rubs or bruits Gastroinestinal: Soft, non tender and non distended. No masses, hepatosplenomegaly or hernias noted. Normal Bowel sounds Rectal:deferred Musculoskeletal: Symmetrical with no gross deformities  Skin: No lesions on visible extremities Pulses:  Normal pulses noted Extremities: No clubbing, cyanosis, edema or deformities noted Neurological: Alert oriented x 4, grossly nonfocal Cervical Nodes:  No significant cervical adenopathy Inguinal Nodes: No significant inguinal adenopathy Psychological:  Alert and cooperative. Normal mood and affect  See Assessment and Plan under Problem List

## 2015-02-06 NOTE — Assessment & Plan Note (Signed)
Limited bright red blood per rectum is suggestive of hemorrhoidal bleeding.  A more proximal colonic bleeding source should be ruled out.  Recommendations #1 colonoscopy  Cc Dr. Alain Marion

## 2015-02-06 NOTE — Patient Instructions (Signed)
It has been recommended to you by your physician that you have a(n) Colonoscopy completed. , We did not schedule the procedure(s) today because we did not have any open available spots. Please contact our office at [phone] should you decide to have the procedure completed.

## 2015-04-22 ENCOUNTER — Encounter: Payer: Self-pay | Admitting: Gastroenterology

## 2015-05-19 HISTORY — PX: HERNIA REPAIR: SHX51

## 2015-06-06 ENCOUNTER — Other Ambulatory Visit: Payer: Self-pay | Admitting: Internal Medicine

## 2015-06-06 ENCOUNTER — Ambulatory Visit (INDEPENDENT_AMBULATORY_CARE_PROVIDER_SITE_OTHER): Payer: Managed Care, Other (non HMO) | Admitting: Internal Medicine

## 2015-06-06 ENCOUNTER — Encounter: Payer: Self-pay | Admitting: Internal Medicine

## 2015-06-06 ENCOUNTER — Other Ambulatory Visit (INDEPENDENT_AMBULATORY_CARE_PROVIDER_SITE_OTHER): Payer: Managed Care, Other (non HMO)

## 2015-06-06 VITALS — BP 130/84 | HR 75 | Ht 66.0 in | Wt 304.0 lb

## 2015-06-06 DIAGNOSIS — Z0000 Encounter for general adult medical examination without abnormal findings: Secondary | ICD-10-CM

## 2015-06-06 DIAGNOSIS — E049 Nontoxic goiter, unspecified: Secondary | ICD-10-CM | POA: Diagnosis not present

## 2015-06-06 DIAGNOSIS — E559 Vitamin D deficiency, unspecified: Secondary | ICD-10-CM

## 2015-06-06 DIAGNOSIS — R7309 Other abnormal glucose: Secondary | ICD-10-CM

## 2015-06-06 DIAGNOSIS — I10 Essential (primary) hypertension: Secondary | ICD-10-CM

## 2015-06-06 DIAGNOSIS — H9202 Otalgia, left ear: Secondary | ICD-10-CM

## 2015-06-06 DIAGNOSIS — K429 Umbilical hernia without obstruction or gangrene: Secondary | ICD-10-CM | POA: Diagnosis not present

## 2015-06-06 LAB — CBC WITH DIFFERENTIAL/PLATELET
Basophils Absolute: 0 10*3/uL (ref 0.0–0.1)
Basophils Relative: 0.4 % (ref 0.0–3.0)
Eosinophils Absolute: 0.2 10*3/uL (ref 0.0–0.7)
Eosinophils Relative: 4.3 % (ref 0.0–5.0)
HCT: 40.8 % (ref 36.0–46.0)
Hemoglobin: 12.9 g/dL (ref 12.0–15.0)
Lymphocytes Relative: 31.8 % (ref 12.0–46.0)
Lymphs Abs: 1.8 10*3/uL (ref 0.7–4.0)
MCHC: 31.7 g/dL (ref 30.0–36.0)
MCV: 91.5 fl (ref 78.0–100.0)
Monocytes Absolute: 0.3 10*3/uL (ref 0.1–1.0)
Monocytes Relative: 5.9 % (ref 3.0–12.0)
Neutro Abs: 3.2 10*3/uL (ref 1.4–7.7)
Neutrophils Relative %: 57.6 % (ref 43.0–77.0)
Platelets: 259 10*3/uL (ref 150.0–400.0)
RBC: 4.46 Mil/uL (ref 3.87–5.11)
RDW: 13.7 % (ref 11.5–15.5)
WBC: 5.6 10*3/uL (ref 4.0–10.5)

## 2015-06-06 LAB — BASIC METABOLIC PANEL
BUN: 12 mg/dL (ref 6–23)
CO2: 30 mEq/L (ref 19–32)
Calcium: 9.5 mg/dL (ref 8.4–10.5)
Chloride: 105 mEq/L (ref 96–112)
Creatinine, Ser: 0.78 mg/dL (ref 0.40–1.20)
GFR: 96.75 mL/min (ref >60.00)
Glucose, Bld: 103 mg/dL — ABNORMAL HIGH (ref 70–99)
Potassium: 4.6 mEq/L (ref 3.5–5.1)
Sodium: 143 mEq/L (ref 135–145)

## 2015-06-06 LAB — LIPID PANEL
Cholesterol: 202 mg/dL — ABNORMAL HIGH (ref 0–200)
HDL: 43.5 mg/dL (ref >39.00)
LDL Cholesterol: 143 mg/dL — ABNORMAL HIGH (ref 0–99)
NonHDL: 158.58
Total CHOL/HDL Ratio: 5
Triglycerides: 78 mg/dL (ref 0.0–149.0)
VLDL: 15.6 mg/dL (ref 0.0–40.0)

## 2015-06-06 LAB — HEMOGLOBIN A1C: Hgb A1c MFr Bld: 5.5 % (ref 4.6–6.5)

## 2015-06-06 LAB — URINALYSIS, ROUTINE W REFLEX MICROSCOPIC
Bilirubin Urine: NEGATIVE
Ketones, ur: NEGATIVE
Leukocytes, UA: NEGATIVE
Nitrite: NEGATIVE
Specific Gravity, Urine: 1.025 (ref 1.000–1.030)
Total Protein, Urine: NEGATIVE
Urine Glucose: NEGATIVE
Urobilinogen, UA: 0.2 (ref 0.0–1.0)
pH: 6 (ref 5.0–8.0)

## 2015-06-06 LAB — HEPATIC FUNCTION PANEL
ALT: 17 U/L (ref 0–35)
AST: 17 U/L (ref 0–37)
Albumin: 4 g/dL (ref 3.5–5.2)
Alkaline Phosphatase: 85 U/L (ref 39–117)
Bilirubin, Direct: 0.1 mg/dL (ref 0.0–0.3)
Total Bilirubin: 0.5 mg/dL (ref 0.2–1.2)
Total Protein: 7.7 g/dL (ref 6.0–8.3)

## 2015-06-06 LAB — HEPATITIS C ANTIBODY: HCV Ab: NEGATIVE

## 2015-06-06 LAB — TSH: TSH: 1.42 u[IU]/mL (ref 0.35–4.50)

## 2015-06-06 LAB — VITAMIN D 25 HYDROXY (VIT D DEFICIENCY, FRACTURES): VITD: 24.32 ng/mL — ABNORMAL LOW (ref 30.00–100.00)

## 2015-06-06 MED ORDER — ERGOCALCIFEROL 1.25 MG (50000 UT) PO CAPS
50000.0000 [IU] | ORAL_CAPSULE | ORAL | Status: DC
Start: 2015-06-06 — End: 2015-07-18

## 2015-06-06 MED ORDER — DOXAZOSIN MESYLATE 4 MG PO TABS: 4.0000 mg | ORAL_TABLET | Freq: Every day | ORAL | Status: DC

## 2015-06-06 MED ORDER — AMLODIPINE BESYLATE 5 MG PO TABS: 5.0000 mg | ORAL_TABLET | Freq: Every day | ORAL | Status: DC

## 2015-06-06 MED ORDER — SERTRALINE HCL 100 MG PO TABS: 100.0000 mg | ORAL_TABLET | Freq: Every day | ORAL | Status: DC

## 2015-06-06 MED ORDER — VITAMIN D 1000 UNITS PO TABS: 1000.0000 [IU] | ORAL_TABLET | Freq: Every day | ORAL | Status: AC

## 2015-06-06 MED ORDER — IBUPROFEN 600 MG PO TABS: 600.0000 mg | ORAL_TABLET | Freq: Three times a day (TID) | ORAL | Status: DC | PRN

## 2015-06-06 MED ORDER — LEVOTHYROXINE SODIUM 175 MCG PO TABS: 175.0000 ug | ORAL_TABLET | Freq: Every day | ORAL | Status: DC

## 2015-06-06 NOTE — Assessment & Plan Note (Signed)
R-sided - worse Surg ref

## 2015-06-06 NOTE — Assessment & Plan Note (Signed)
On Amlodipine, cardura She denies any previous allergies to meds.  She declined diuretics.  NAS diet

## 2015-06-06 NOTE — Assessment & Plan Note (Signed)
Chronic ?Re-start Vit D ?

## 2015-06-06 NOTE — Progress Notes (Signed)
Pre visit review using our clinic review tool, if applicable. No additional management support is needed unless otherwise documented below in the visit note. 

## 2015-06-06 NOTE — Assessment & Plan Note (Signed)
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

## 2015-06-06 NOTE — Assessment & Plan Note (Signed)
We discussed age appropriate health related issues, including available/recomended screening tests and vaccinations. We discussed a need for adhering to healthy diet and exercise. Labs/EKG were reviewed/ordered. All questions were answered. Pt declined shots Labs Colon due 2017 - next mo

## 2015-06-06 NOTE — Progress Notes (Signed)
Subjective:  Patient ID: Tracy Sampson, female    DOB: 13-May-1956  Age: 60 y.o. MRN: [id number]  CC: Annual Exam   HPI Tracy Sampson presents for a well exam. F/u  HTN, obesity, hypothyroidism. C/o abd bulge - worse. C/o L ear/face pain x  1 wk  Outpatient Prescriptions Prior to Visit  Medication Sig Dispense Refill  . amLODipine (NORVASC) 5 MG tablet Take 1 tablet (5 mg total) by mouth daily. 90 tablet 3  . doxazosin (CARDURA) 4 MG tablet Take 1 tablet (4 mg total) by mouth daily. 90 tablet 3  . levothyroxine (SYNTHROID, LEVOTHROID) 175 MCG tablet Take 1 tablet (175 mcg total) by mouth daily. 90 tablet 3  . sertraline (ZOLOFT) 100 MG tablet Take 1 tablet (100 mg total) by mouth daily. 90 tablet 3  . tobramycin (TOBREX) 0.3 % ophthalmic solution instill 1 drop into left eye four times a day BEGIN 1 DAY PRIOR T...  (REFER TO PRESCRIPTION NOTES).  0  . vitamin B-12 (CYANOCOBALAMIN) 500 MCG tablet Take 500 mcg by mouth daily.     No facility-administered medications prior to visit.    ROS Review of Systems  Constitutional: Negative for chills, activity change, appetite change, fatigue and unexpected weight change.  HENT: Negative for congestion, mouth sores and sinus pressure.   Eyes: Negative for visual disturbance.  Respiratory: Negative for cough and chest tightness.   Gastrointestinal: Negative for nausea and abdominal pain.  Genitourinary: Negative for frequency, difficulty urinating and vaginal pain.  Musculoskeletal: Negative for back pain and gait problem.  Skin: Negative for pallor and rash.  Neurological: Negative for dizziness, tremors, weakness, numbness and headaches.  Psychiatric/Behavioral: Negative for suicidal ideas, confusion and sleep disturbance. The patient is not nervous/anxious.     Objective:  BP 130/84 mmHg  Pulse 75  Ht 5\' 6"  (1.676 m)  Wt 304 lb (137.893 kg)  BMI 49.09 kg/m2  SpO2 95%  BP Readings from Last 3 Encounters:  06/06/15 130/84    02/06/15 130/90  12/06/14 148/88    Wt Readings from Last 3 Encounters:  06/06/15 304 lb (137.893 kg)  02/06/15 297 lb (134.718 kg)  12/06/14 298 lb (135.172 kg)    Physical Exam  Constitutional: She appears well-developed. No distress.  HENT:  Head: Normocephalic.  Right Ear: External ear normal.  Left Ear: External ear normal.  Nose: Nose normal.  Mouth/Throat: Oropharynx is clear and moist.  Eyes: Conjunctivae are normal. Pupils are equal, round, and reactive to light. Right eye exhibits no discharge. Left eye exhibits no discharge.  Neck: Normal range of motion. Neck supple. No JVD present. No tracheal deviation present. No thyromegaly present.  Cardiovascular: Normal rate, regular rhythm and normal heart sounds.   Pulmonary/Chest: No stridor. No respiratory distress. She has no wheezes.  Abdominal: Soft. Bowel sounds are normal. She exhibits no distension and no mass. There is no tenderness. There is no rebound and no guarding.  Musculoskeletal: She exhibits no edema or tenderness.  Lymphadenopathy:    She has no cervical adenopathy.  Neurological: She displays normal reflexes. No cranial nerve deficit. She exhibits normal muscle tone. Coordination normal.  Skin: No rash noted. No erythema.  Psychiatric: She has a normal mood and affect. Her behavior is normal. Judgment and thought content normal.  Obese Large R sided umbil hernia, NT - orange size goiter  Lab Results  Component Value Date   WBC 7.5 08/16/2013   HGB 13.0 08/16/2013   HCT 40.4 08/16/2013  PLT 272.0 08/16/2013   GLUCOSE 98 08/16/2013   CHOL 220* 08/16/2013   TRIG 205.0* 08/16/2013   HDL 36.70* 08/16/2013   LDLDIRECT 149.3 10/23/2010   LDLCALC 142* 08/16/2013   ALT 21 08/16/2013   AST 24 08/16/2013   NA 139 08/16/2013   K 3.7 08/16/2013   CL 102 08/16/2013   CREATININE 0.8 08/16/2013   BUN 11 08/16/2013   CO2 28 08/16/2013   TSH 0.66 08/16/2013   HGBA1C 5.5 08/16/2013    Mm Digital  Screening Bilateral  09/21/2014  CLINICAL DATA:  Screening. EXAM: DIGITAL SCREENING BILATERAL MAMMOGRAM WITH CAD COMPARISON:  Previous exam(s). ACR Breast Density Category a: The breast tissue is almost entirely fatty. FINDINGS: There are no findings suspicious for malignancy. Images were processed with CAD. IMPRESSION: No mammographic evidence of malignancy. A result letter of this screening mammogram will be mailed directly to the patient. RECOMMENDATION: Screening mammogram in one year. (Code:SM-B-01Y) BI-RADS CATEGORY  1: Negative. Electronically Signed   By: Nolon Nations M.D.   On: 09/21/2014 15:02    Assessment & Plan:   There are no diagnoses linked to this encounter. I am having Ms. Gural maintain her vitamin B-12, sertraline, amLODipine, doxazosin, levothyroxine, tobramycin, ALPHAGAN P, and metroNIDAZOLE.  Meds ordered this encounter  Medications  . ALPHAGAN P 0.1 % SOLN    Sig: Place 1 drop into both eyes 2 (two) times daily.    Refill:  0  . metroNIDAZOLE (FLAGYL) 500 MG tablet    Sig: Take 1 tablet by mouth as needed.    Refill:  0     Follow-up: No Follow-up on file.  Walker Kehr, MD

## 2015-06-06 NOTE — Patient Instructions (Signed)
Drink water in place of soda

## 2015-06-06 NOTE — Assessment & Plan Note (Signed)
1/17 wt gain: drinking reg pepsi a lot - discussed

## 2015-06-20 ENCOUNTER — Ambulatory Visit
Admission: RE | Admit: 2015-06-20 | Discharge: 2015-06-20 | Disposition: A | Discharge status: Home / Self Care | Payer: Managed Care, Other (non HMO) | Source: Ambulatory Visit | Attending: Internal Medicine | Admitting: Internal Medicine

## 2015-06-28 ENCOUNTER — Ambulatory Visit (AMBULATORY_SURGERY_CENTER): Payer: Self-pay | Admitting: *Deleted

## 2015-06-28 VITALS — Ht 66.0 in | Wt 303.0 lb

## 2015-06-28 DIAGNOSIS — Z1211 Encounter for screening for malignant neoplasm of colon: Secondary | ICD-10-CM

## 2015-06-28 MED ORDER — NA SULFATE-K SULFATE-MG SULF 17.5-3.13-1.6 GM/177ML PO SOLN: 1.0000 | Freq: Once | ORAL | Status: DC

## 2015-06-28 NOTE — Progress Notes (Signed)
No egg or soy allergy known to patient  No issues with past sedation with any surgeries  or procedures, no intubation problems  No diet pills per patient No home 02 use per patient  No blood thinners per patient  Pt states she has no issues with constipation

## 2015-07-12 ENCOUNTER — Ambulatory Visit (AMBULATORY_SURGERY_CENTER): Payer: Managed Care, Other (non HMO) | Admitting: Gastroenterology

## 2015-07-12 ENCOUNTER — Encounter: Payer: Self-pay | Admitting: Gastroenterology

## 2015-07-12 VITALS — BP 146/83 | HR 75 | Temp 98.0°F | Resp 20 | Ht 66.0 in | Wt 303.0 lb

## 2015-07-12 DIAGNOSIS — K635 Polyp of colon: Secondary | ICD-10-CM | POA: Diagnosis not present

## 2015-07-12 DIAGNOSIS — Z1211 Encounter for screening for malignant neoplasm of colon: Secondary | ICD-10-CM | POA: Diagnosis not present

## 2015-07-12 DIAGNOSIS — D120 Benign neoplasm of cecum: Secondary | ICD-10-CM

## 2015-07-12 MED ORDER — SODIUM CHLORIDE 0.9 % IV SOLN: 500.0000 mL | INTRAVENOUS | Status: DC

## 2015-07-12 NOTE — Progress Notes (Signed)
A/ox3 pleased with MAC, report to Jill RN 

## 2015-07-12 NOTE — Patient Instructions (Signed)
YOU HAD AN ENDOSCOPIC PROCEDURE TODAY AT [facility]:   Refer to the procedure report that was given to you for any specific questions about what was found during the examination.  If the procedure report does not answer your questions, please call your gastroenterologist to clarify.  If you requested that your care partner not be given the details of your procedure findings, then the procedure report has been included in a sealed envelope for you to review at your convenience later.  YOU SHOULD EXPECT: Some feelings of bloating in the abdomen. Passage of more gas than usual.  Walking can help get rid of the air that was put into your GI tract during the procedure and reduce the bloating. If you had a lower endoscopy (such as a colonoscopy or flexible sigmoidoscopy) you may notice spotting of blood in your stool or on the toilet paper. If you underwent a bowel prep for your procedure, you may not have a normal bowel movement for a few days.  Please Note:  You might notice some irritation and congestion in your nose or some drainage.  This is from the oxygen used during your procedure.  There is no need for concern and it should clear up in a day or so.  SYMPTOMS TO REPORT IMMEDIATELY:   Following lower endoscopy (colonoscopy or flexible sigmoidoscopy):  Excessive amounts of blood in the stool  Significant tenderness or worsening of abdominal pains  Swelling of the abdomen that is new, acute  Fever of 100F or higher  For urgent or emergent issues, a gastroenterologist can be reached at any hour by calling [phone].   DIET: Your first meal following the procedure should be a small meal and then it is ok to progress to your normal diet. Heavy or fried foods are harder to digest and may make you feel nauseous or bloated.  Likewise, meals heavy in dairy and vegetables can increase bloating.  Drink plenty of fluids but you should avoid alcoholic beverages for 24  hours.  ACTIVITY:  You should plan to take it easy for the rest of today and you should NOT DRIVE or use heavy machinery until tomorrow (because of the sedation medicines used during the test).    FOLLOW UP: Our staff will call the number listed on your records the next business day following your procedure to check on you and address any questions or concerns that you may have regarding the information given to you following your procedure. If we do not reach you, we will leave a message.  However, if you are feeling well and you are not experiencing any problems, there is no need to return our call.  We will assume that you have returned to your regular daily activities without incident.  If any biopsies were taken you will be contacted by phone or by letter within the next 1-3 weeks.  Please call us at [phone] if you have not heard about the biopsies in 3 weeks.    SIGNATURES/CONFIDENTIALITY: You and/or your care partner have signed paperwork which will be entered into your electronic medical record.  These signatures attest to the fact that that the information above on your After Visit Summary has been reviewed and is understood.  Full responsibility of the confidentiality of this discharge information lies with you and/or your care-partner.  Polyp/ Diverticulosis handout given Await pathology results Resume regular medications and diet

## 2015-07-12 NOTE — Op Note (Signed)
[facility]  [address] [place], [id number]   COLONOSCOPY PROCEDURE REPORT  PATIENT: Tracy Sampson, Tracy Sampson  MR#: [id number] BIRTHDATE: 1956-04-07 , 59  yrs. old GENDER: female ENDOSCOPIST: Harl Bowie, MD REFERRED [id number] Avel Sensor, M.D. PROCEDURE DATE:  07/12/2015 PROCEDURE:   Colonoscopy, screening and Colonoscopy with cold biopsy polypectomy First Screening Colonoscopy - Avg.  risk and is 50 yrs.  old or older - No.  Prior Negative Screening - Now for repeat screening. 10 or more years since last screening  History of Adenoma - Now for follow-up colonoscopy & has been > or = to 3 yrs.  N/A  Polyps removed today? Yes ASA CLASS:   Class II INDICATIONS:Screening for colonic neoplasia and Colorectal Neoplasm Risk Assessment for this procedure is average risk. MEDICATIONS: Propofol 250 mg IV  DESCRIPTION OF PROCEDURE:   After the risks benefits and alternatives of the procedure were thoroughly explained, informed consent was obtained.  The digital rectal exam revealed no abnormalities of the rectum.   The LB [id number] and [id number]  endoscope was introduced through the anus and advanced to the cecum, which was identified by both the appendix and ileocecal valve. No adverse events experienced.   The quality of the prep was adequate  The instrument was then slowly withdrawn as the colon was fully examined. Estimated blood loss is zero unless otherwise noted in this procedure report.   COLON FINDINGS: A flat polyp ranging between 3-50mm in size was found at the cecum.  A biopsy was performed using cold forceps.   There was mild diverticulosis noted in the sigmoid colon.   The examination was otherwise normal.  Retroflexed views revealed internal Grade I hemorrhoids. The time to cecum = 3.2 Withdrawal time = 5.2   The scope was withdrawn and the procedure completed. COMPLICATIONS: There were no immediate complications.  ENDOSCOPIC  IMPRESSION: 1.   Flat polyp ranging between 3-69mm in size was found at the cecum; biopsy was performed using cold forceps 2.   Mild diverticulosis was noted in the sigmoid colon 3.   The examination was otherwise normal  RECOMMENDATIONS: If the polyp(s) removed today are proven to be adenomatous (pre-cancerous) polyps, you will need a repeat colonoscopy in 5 years.  Otherwise you should continue to follow colorectal cancer screening guidelines for "routine risk" patients with colonoscopy in 10 years.  You will receive a letter within 1-2 weeks with the results of your biopsy as well as final recommendations.  Please call my office if you have not received a letter after 3 weeks.  eSigned:  Harl Bowie, MD 07/12/2015 2:40 PM

## 2015-07-12 NOTE — Progress Notes (Signed)
Called to room to assist during endoscopic procedure.  Patient ID and intended procedure confirmed with present staff. Received instructions for my participation in the procedure from the performing physician.  

## 2015-07-15 ENCOUNTER — Telehealth: Payer: Self-pay | Admitting: *Deleted

## 2015-07-15 NOTE — Telephone Encounter (Signed)
  Follow up Call-  Call back number 07/12/2015  Post procedure Call Back phone  # [phone]  Permission to leave phone message Yes     Patient questions:  Do you have a fever, pain , or abdominal swelling? No. Pain Score  0 *  Have you tolerated food without any problems? Yes.    Have you been able to return to your normal activities? Yes.    Do you have any questions about your discharge instructions: Diet   No. Medications  No. Follow up visit  No.  Do you have questions or concerns about your Care? No.  Actions: * If pain score is 4 or above: No action needed, pain <4.

## 2015-07-18 ENCOUNTER — Encounter: Payer: Self-pay | Admitting: Adult Health

## 2015-07-18 ENCOUNTER — Ambulatory Visit (INDEPENDENT_AMBULATORY_CARE_PROVIDER_SITE_OTHER): Payer: Managed Care, Other (non HMO) | Admitting: Adult Health

## 2015-07-18 VITALS — BP 142/90 | HR 78 | Temp 98.3°F | Ht 66.0 in | Wt 301.0 lb

## 2015-07-18 DIAGNOSIS — G4730 Sleep apnea, unspecified: Secondary | ICD-10-CM

## 2015-07-18 DIAGNOSIS — G4733 Obstructive sleep apnea (adult) (pediatric): Secondary | ICD-10-CM | POA: Diagnosis not present

## 2015-07-18 NOTE — Patient Instructions (Addendum)
Wear CPAP At bedtime  -every night  Goal is at least 4-6 hr each night  Work on weight loss.  Do not drive if sleepy.  Order sent for new mask and supplies  follow up Dr. Elsworth Soho  In 6 months  and As needed

## 2015-07-18 NOTE — Assessment & Plan Note (Signed)
OSA -controlled on CPAP  Order for new supplies and mask.   Plan  Wear CPAP At bedtime  -every night  Goal is at least 4-6 hr each night  Work on weight loss.  Do not drive if sleepy.  Order sent for new mask and supplies  follow up Dr. Elsworth Soho  In 6 months  and As needed

## 2015-07-18 NOTE — Assessment & Plan Note (Signed)
Work on weight loss.  follow up Dr. Elsworth Soho  In 6 months  and As needed

## 2015-07-18 NOTE — Progress Notes (Signed)
   Subjective:    Patient ID: Tracy Sampson, female    DOB: August 07, 1955, 60 y.o.   MRN: [id number]  HPI 11 /F, obese for FU of obstructive sleep apnea diagnosed in 4/03 on autoCPAP.  Rpt titration 04/2008- wt 298 lbs - showed adequate control of events with CPAP 15 cm, now on autoCPAP.  Download 07/2008 >> avg pressure 13 cm    07/18/2015 Follow up OSA  Pt returns for follow up for sleep apnea.  Says she is doing well on CPAP.  Needs new mask.  Patient says mask is over a year old.  Needs new supplies. no other concerns. ;has machine with her but unable to get download.  She is going to [facility] today to pick up supplies. Says her mask is way too old.  Denies chest pain, orthopnea, edema or fever.  Discussed wt loss and not driving if sleepy.      Review of Systems  Constitutional: Negative for fever and unexpected weight change.  HENT: Negative for congestion, dental problem, ear pain, nosebleeds, postnasal drip, rhinorrhea, sinus pressure, sneezing, sore throat and trouble swallowing.   Eyes: Negative for redness and itching.  Respiratory: Negative for cough, chest tightness, shortness of breath and wheezing.   Cardiovascular: Negative for palpitations and leg swelling.  Gastrointestinal: Negative for nausea and vomiting.  Genitourinary: Negative for dysuria.  Musculoskeletal: Negative for joint swelling.  Skin: Negative for rash.  Neurological: Negative for headaches.  Hematological: Does not bruise/bleed easily.  Psychiatric/Behavioral: Negative for dysphoric mood. The patient is not nervous/anxious.        Objective:   Physical Exam Filed Vitals:   07/18/15 0932  Pulse: 78  Temp: 98.3 F (36.8 C)  TempSrc: Oral  Height: 5\' 6"  (1.676 m)  Weight: 301 lb (136.533 kg)  SpO2: 93%   Body mass index is 48.61 kg/(m^2).   Gen. Pleasant, obese, in no distress ENT - no lesions, no post nasal drip, class 2 MP airway  Neck: No JVD, no thyromegaly, no carotid bruits Lungs: no  use of accessory muscles, no dullness to percussion, decreased without rales or rhonchi  Cardiovascular: Rhythm regular, heart sounds  normal, no murmurs or gallops, no peripheral edema Musculoskeletal: No deformities, no cyanosis or clubbing , no tremors       Assessment & Plan:

## 2015-07-24 ENCOUNTER — Encounter: Payer: Self-pay | Admitting: Gastroenterology

## 2015-08-26 ENCOUNTER — Other Ambulatory Visit: Payer: Self-pay | Admitting: Internal Medicine

## 2015-08-26 DIAGNOSIS — Z1231 Encounter for screening mammogram for malignant neoplasm of breast: Secondary | ICD-10-CM

## 2015-09-13 ENCOUNTER — Other Ambulatory Visit: Payer: Self-pay | Admitting: Surgery

## 2015-09-24 ENCOUNTER — Ambulatory Visit (HOSPITAL_BASED_OUTPATIENT_CLINIC_OR_DEPARTMENT_OTHER): Payer: Managed Care, Other (non HMO)

## 2015-09-24 ENCOUNTER — Ambulatory Visit (HOSPITAL_BASED_OUTPATIENT_CLINIC_OR_DEPARTMENT_OTHER)
Admission: RE | Admit: 2015-09-24 | Discharge: 2015-09-24 | Disposition: A | Discharge status: Home / Self Care | Payer: Managed Care, Other (non HMO) | Source: Ambulatory Visit | Attending: Internal Medicine | Admitting: Internal Medicine

## 2015-09-24 DIAGNOSIS — Z1231 Encounter for screening mammogram for malignant neoplasm of breast: Secondary | ICD-10-CM | POA: Diagnosis not present

## 2015-10-09 ENCOUNTER — Other Ambulatory Visit: Payer: Self-pay | Admitting: Obstetrics & Gynecology

## 2015-12-04 ENCOUNTER — Ambulatory Visit: Payer: Managed Care, Other (non HMO) | Admitting: Internal Medicine

## 2015-12-16 NOTE — Progress Notes (Signed)
Please place orders in EPIC as patient has a pre-op appointment with the nurse on Wednesday 12/25/2015 at 0830 am ! Thank you!

## 2015-12-23 ENCOUNTER — Other Ambulatory Visit: Payer: Self-pay | Admitting: Surgery

## 2015-12-23 ENCOUNTER — Encounter (HOSPITAL_COMMUNITY): Payer: Self-pay

## 2015-12-23 NOTE — Patient Instructions (Signed)
Tracy Sampson  12/23/2015   Your procedure is scheduled on: 01/07/2016    Report to [facility] Main  Entrance take [facility]  elevators to 3rd floor to  [place] at    0600 AM.  Call this number if you have problems the morning of surgery [phone]   Remember: ONLY 1 PERSON MAY GO WITH YOU TO SHORT STAY TO GET  READY MORNING OF [place].  Do not eat food or drink liquids :After Midnight.     Take these medicines the morning of surgery with A SIP OF WATER: alphagan eye drops, amlodipine ( NOrvasc), Doxazosin, Synthroid, Zoloft                                 You may not have any metal on your body including hair pins and              piercings  Do not wear jewelry, make-up, lotions, powders or perfumes, deodorant             Do not wear nail polish.  Do not shave  48 hours prior to surgery.                 Do not bring valuables to the hospital. [place].  Contacts, dentures or bridgework may not be worn into surgery.       Patients discharged the day of surgery will not be allowed to drive home.  Name and phone number of your driver:  Special Instructions: coughing and deep breathing exercises, leg exercises               Please read over the following fact sheets you were given: _____________________________________________________________________             [facility] - Preparing for Surgery Before surgery, you can play an important role.  Because skin is not sterile, your skin needs to be as free of germs as possible.  You can reduce the number of germs on your skin by washing with CHG (chlorahexidine gluconate) soap before surgery.  CHG is an antiseptic cleaner which kills germs and bonds with the skin to continue killing germs even after washing. Please DO NOT use if you have an allergy to CHG or antibacterial soaps.  If your skin becomes reddened/irritated stop using the CHG and  inform your nurse when you arrive at Short Stay. Do not shave (including legs and underarms) for at least 48 hours prior to the first CHG shower.  You may shave your face/neck. Please follow these instructions carefully:  1.  Shower with CHG Soap the night before surgery and the  morning of Surgery.  2.  If you choose to wash your hair, wash your hair first as usual with your  normal  shampoo.  3.  After you shampoo, rinse your hair and body thoroughly to remove the  shampoo.                           4.  Use CHG as you would any other liquid soap.  You can apply chg directly  to the skin and wash  Gently with a scrungie or clean washcloth.  5.  Apply the CHG Soap to your body ONLY FROM THE NECK DOWN.   Do not use on face/ open                           Wound or open sores. Avoid contact with eyes, ears mouth and genitals (private parts).                       Wash face,  Genitals (private parts) with your normal soap.             6.  Wash thoroughly, paying special attention to the area where your surgery  will be performed.  7.  Thoroughly rinse your body with warm water from the neck down.  8.  DO NOT shower/wash with your normal soap after using and rinsing off  the CHG Soap.                9.  Pat yourself dry with a clean towel.            10.  Wear clean pajamas.            11.  Place clean sheets on your bed the night of your first shower and do not  sleep with pets. Day of Surgery : Do not apply any lotions/deodorants the morning of surgery.  Please wear clean clothes to the hospital/surgery center.  FAILURE TO FOLLOW THESE INSTRUCTIONS MAY RESULT IN THE CANCELLATION OF YOUR SURGERY PATIENT SIGNATURE_________________________________  NURSE SIGNATURE__________________________________  ________________________________________________________________________

## 2015-12-25 ENCOUNTER — Encounter (HOSPITAL_COMMUNITY)
Admission: RE | Admit: 2015-12-25 | Discharge: 2015-12-25 | Disposition: A | Discharge status: Home / Self Care | Payer: Managed Care, Other (non HMO) | Source: Ambulatory Visit | Attending: Surgery | Admitting: Surgery

## 2015-12-25 ENCOUNTER — Encounter (HOSPITAL_COMMUNITY): Payer: Self-pay

## 2015-12-25 DIAGNOSIS — I10 Essential (primary) hypertension: Secondary | ICD-10-CM | POA: Insufficient documentation

## 2015-12-25 DIAGNOSIS — Z01810 Encounter for preprocedural cardiovascular examination: Secondary | ICD-10-CM | POA: Diagnosis present

## 2015-12-25 DIAGNOSIS — Z01812 Encounter for preprocedural laboratory examination: Secondary | ICD-10-CM | POA: Diagnosis present

## 2015-12-25 HISTORY — DX: Hypothyroidism, unspecified: E03.9

## 2015-12-25 HISTORY — DX: Unspecified osteoarthritis, unspecified site: M19.90

## 2015-12-25 HISTORY — DX: Unspecified urinary incontinence: R32

## 2015-12-25 LAB — BASIC METABOLIC PANEL WITH GFR
Anion gap: 7 (ref 5–15)
BUN: 10 mg/dL (ref 6–20)
CO2: 28 mmol/L (ref 22–32)
Calcium: 9.2 mg/dL (ref 8.9–10.3)
Chloride: 109 mmol/L (ref 101–111)
Creatinine, Ser: 0.75 mg/dL (ref 0.44–1.00)
GFR calc Af Amer: >60 mL/min
GFR calc non Af Amer: >60 mL/min
Glucose, Bld: 108 mg/dL — ABNORMAL HIGH (ref 65–99)
Potassium: 4 mmol/L (ref 3.5–5.1)
Sodium: 144 mmol/L (ref 135–145)

## 2015-12-25 LAB — CBC
HCT: 39.4 % (ref 36.0–46.0)
Hemoglobin: 12 g/dL (ref 12.0–15.0)
MCH: 28.7 pg (ref 26.0–34.0)
MCHC: 30.5 g/dL (ref 30.0–36.0)
MCV: 94.3 fL (ref 78.0–100.0)
Platelets: 258 10*3/uL (ref 150–400)
RBC: 4.18 MIL/uL (ref 3.87–5.11)
RDW: 13.5 % (ref 11.5–15.5)
WBC: 4.9 10*3/uL (ref 4.0–10.5)

## 2015-12-25 NOTE — Progress Notes (Signed)
Final EKG done 12/25/15- EPIC.

## 2016-01-06 ENCOUNTER — Encounter (HOSPITAL_COMMUNITY): Payer: Self-pay | Admitting: Certified Registered Nurse Anesthetist

## 2016-01-06 MED ORDER — DEXTROSE 5 % IV SOLN
3.0000 g | INTRAVENOUS | Status: AC
  Administered 2016-01-07: 3 g via INTRAVENOUS
  Filled 2016-01-06: qty 3

## 2016-01-06 NOTE — Anesthesia Preprocedure Evaluation (Signed)
Anesthesia Evaluation  Patient identified by MRN, date of birth, ID band Patient awake    Reviewed: Allergy & Precautions, H&P , NPO status , Patient's Chart, lab work & pertinent test results  History of Anesthesia Complications Negative for: history of anesthetic complications  Airway Mallampati: II  TM Distance: >3 FB Neck ROM: full    Dental no notable dental hx.    Pulmonary sleep apnea ,    Pulmonary exam normal breath sounds clear to auscultation       Cardiovascular hypertension, Pt. on medications Normal cardiovascular exam Rhythm:regular Rate:Normal     Neuro/Psych negative neurological ROS     GI/Hepatic Neg liver ROS, GERD  ,  Endo/Other  Hypothyroidism Morbid obesity  Renal/GU negative Renal ROS     Musculoskeletal  (+) Arthritis ,   Abdominal   Peds  Hematology negative hematology ROS (+)   Anesthesia Other Findings   Reproductive/Obstetrics negative OB ROS                             Anesthesia Physical Anesthesia Plan  ASA: III  Anesthesia Plan: General   Post-op Pain Management:    Induction: Intravenous  Airway Management Planned: Oral ETT  Additional Equipment:   Intra-op Plan:   Post-operative Plan: Extubation in OR  Informed Consent: I have reviewed the patients History and Physical, chart, labs and discussed the procedure including the risks, benefits and alternatives for the proposed anesthesia with the patient or authorized representative who has indicated his/her understanding and acceptance.   Dental Advisory Given  Plan Discussed with: Anesthesiologist, CRNA and Surgeon  Anesthesia Plan Comments:         Anesthesia Quick Evaluation

## 2016-01-06 NOTE — H&P (Signed)
Tracy Sampson  Location: [facility] Surgery Patient #: [id number] DOB: 1955-12-27 Single / Language: Tracy Sampson / Race: Black or African American Female   History of Present Illness (Buford Bremer A. Ninfa Linden MD;  Patient words: hernia.  The patient is a 60 year old female who presents with an incisional hernia. This is a pleasant female referred by Dr. Tyrone Apple Plotnikov for evaluation of a ventral hernia. The patient reports she has had a hernia for many years but is now getting larger. She describes it as feeling heavy but has no real discomfort from a pain standpoint. She also has had no effective symptoms. She is otherwise without complaints. She has had a previous hysterectomy   Other Problems  High blood pressure Sleep Apnea Thyroid Disease  Past Surgical History Ventura Sellers, CMA Breast Biopsy Left. Colon Removal - Partial Foot Surgery Left. Hysterectomy (not due to cancer) - Partial  Diagnostic Studies History Ventura Sellers, [place];  Colonoscopy within last year Mammogram within last year Pap Smear 1-5 years ago  Allergies Ventura Sellers, [place];  No Known Drug Allergies  Medication History Ventura Sellers, CMA;  Alphagan P (0.1% Solution, Ophthalmic) Active. AmLODIPine Besylate (5MG  Tablet, Oral) Active. Doxazosin Mesylate (4MG  Tablet, Oral) Active. Ibuprofen (600MG  Tablet, Oral) Active. Levothyroxine Sodium (175MCG Tablet, Oral) Active. MetroNIDAZOLE (500MG  Tablet, Oral) Active. Sertraline HCl (100MG  Tablet, Oral) Active. Vitamin D (Ergocalciferol) (50000UNIT Capsule, Oral) Active. Medications Reconciled  Social History Ventura Sellers, [place]; Caffeine use Coffee. No alcohol use No drug use Tobacco use Never smoker.  Family History Ventura Sellers, [place];  Alcohol Abuse Brother, Father. Breast Cancer Mother. Hypertension Mother.  Pregnancy / Birth History Ventura Sellers, [place];  Age at menarche 78  years. Age of menopause 51-55 Irregular periods    Review of Systems [facility] R. Brooks CMA General Not Present- Appetite Loss, Chills, Fatigue, Fever, Night Sweats, Weight Gain and Weight Loss. Skin Not Present- Change in Wart/Mole, Dryness, Hives, Jaundice, New Lesions, Non-Healing Wounds, Rash and Ulcer. HEENT Not Present- Earache, Hearing Loss, Hoarseness, Nose Bleed, Oral Ulcers, Ringing in the Ears, Seasonal Allergies, Sinus Pain, Sore Throat, Visual Disturbances, Wears glasses/contact lenses and Yellow Eyes. Respiratory Not Present- Bloody sputum, Chronic Cough, Difficulty Breathing, Snoring and Wheezing. Breast Not Present- Breast Mass, Breast Pain, Nipple Discharge and Skin Changes. Cardiovascular Not Present- Chest Pain, Difficulty Breathing Lying Down, Leg Cramps, Palpitations, Rapid Heart Rate, Shortness of Breath and Swelling of Extremities. Gastrointestinal Not Present- Abdominal Pain, Bloating, Bloody Stool, Change in Bowel Habits, Chronic diarrhea, Constipation, Difficulty Swallowing, Excessive gas, Gets full quickly at meals, Hemorrhoids, Indigestion, Nausea, Rectal Pain and Vomiting. Female Genitourinary Not Present- Frequency, Nocturia, Painful Urination, Pelvic Pain and Urgency. Musculoskeletal Not Present- Back Pain, Joint Pain, Joint Stiffness, Muscle Pain, Muscle Weakness and Swelling of Extremities. Neurological Not Present- Decreased Memory, Fainting, Headaches, Numbness, Seizures, Tingling, Tremor, Trouble walking and Weakness. Psychiatric Not Present- Anxiety, Bipolar, Change in Sleep Pattern, Depression, Fearful and Frequent crying. Endocrine Not Present- Cold Intolerance, Excessive Hunger, Hair Changes, Heat Intolerance, Hot flashes and New Diabetes. Hematology Not Present- Easy Bruising, Excessive bleeding, Gland problems, HIV and Persistent Infections.  Vitals   Weight: 302 lb Height: 66in Body Surface Area: 2.38 m Body Mass Index: 48.74 kg/m  BP:  122/84 (Sitting, Left Arm, Standard)    Physical Exam The physical exam findings are as follows: Note:Morbidly obese  General Mental Status-Alert. General Appearance-Consistent with stated age. Hydration-Well hydrated. Voice-Normal.  Head and Neck Head-normocephalic, atraumatic with no lesions  or palpable masses. Trachea-midline.  Eye Eyeball - Bilateral-Extraocular movements intact. Sclera/Conjunctiva - Bilateral-No scleral icterus.  Chest and Lung Exam Chest and lung exam reveals -quiet, even and easy respiratory effort with no use of accessory muscles and on auscultation, normal breath sounds, no adventitious sounds and normal vocal resonance. Inspection Chest Wall - Normal. Back - normal.  Cardiovascular Cardiovascular examination reveals -normal heart sounds, regular rate and rhythm with no murmurs and normal pedal pulses bilaterally.  Abdomen Inspection Skin - Scar - no surgical scars. Hernias - Ventral - Incarcerated. Note: There is a ventral hernia above the umbilicus. It feels to contain incarcerated omentum and is nontender and I cannot reduce it. This is well above her lower midline incision and also above the umbilicus. Palpation/Percussion Palpation and Percussion of the abdomen reveal - Soft, Non Tender, No Rebound tenderness, No Rigidity (guarding) and No hepatosplenomegaly. Auscultation Auscultation of the abdomen reveals - Bowel sounds normal.  Neurologic Neurologic evaluation reveals -alert and oriented x 3 with no impairment of recent or remote memory. Mental Status-Normal.  Musculoskeletal Normal Exam - Left-Upper Extremity Strength Normal and Lower Extremity Strength Normal. Normal Exam - Right-Upper Extremity Strength Normal, Lower Extremity Weakness.    Assessment & Plan   INCARCERATED VENTRAL HERNIA (K46.0)  Impression: I discussed the diagnosis with her and gave her literature regarding hernias. I discussed  hernia repair with mesh. I believe the fascial defect may be smaller so I would recommend an open repair with mesh. I discussed the risk of surgery which includes but is not limited to bleeding, infection, injury to surrounding structures, the need to convert to an open procedure, cardiopulmonary issues, postoperative recovery, etc. She understands and wishes to proceed with surgery which will be scheduled Current Plans Pt Education - Pamphlet Given - Hernia Surgery: discussed with patient and provided information.

## 2016-01-07 ENCOUNTER — Encounter (HOSPITAL_COMMUNITY): Payer: Self-pay | Admitting: *Deleted

## 2016-01-07 ENCOUNTER — Ambulatory Visit (HOSPITAL_COMMUNITY): Payer: Managed Care, Other (non HMO) | Admitting: Certified Registered Nurse Anesthetist

## 2016-01-07 ENCOUNTER — Encounter (HOSPITAL_COMMUNITY)
Admission: RE | Disposition: A | Discharge status: Home / Self Care | Payer: Self-pay | Source: Ambulatory Visit | Attending: Surgery

## 2016-01-07 ENCOUNTER — Observation Stay (HOSPITAL_COMMUNITY)
Admission: RE | Admit: 2016-01-07 | Discharge: 2016-01-09 | Disposition: A | Discharge status: Home / Self Care | Payer: Managed Care, Other (non HMO) | Source: Ambulatory Visit | Attending: Surgery | Admitting: Surgery

## 2016-01-07 DIAGNOSIS — K436 Other and unspecified ventral hernia with obstruction, without gangrene: Secondary | ICD-10-CM | POA: Diagnosis present

## 2016-01-07 DIAGNOSIS — Z6841 Body Mass Index (BMI) 40.0 and over, adult: Secondary | ICD-10-CM | POA: Diagnosis not present

## 2016-01-07 DIAGNOSIS — E039 Hypothyroidism, unspecified: Secondary | ICD-10-CM | POA: Insufficient documentation

## 2016-01-07 DIAGNOSIS — I10 Essential (primary) hypertension: Secondary | ICD-10-CM | POA: Diagnosis not present

## 2016-01-07 DIAGNOSIS — G4730 Sleep apnea, unspecified: Secondary | ICD-10-CM | POA: Diagnosis not present

## 2016-01-07 DIAGNOSIS — Z79899 Other long term (current) drug therapy: Secondary | ICD-10-CM | POA: Diagnosis not present

## 2016-01-07 HISTORY — PX: INSERTION OF MESH: SHX5868

## 2016-01-07 HISTORY — PX: VENTRAL HERNIA REPAIR: SHX424

## 2016-01-07 SURGERY — REPAIR, HERNIA, VENTRAL
Anesthesia: General | Site: Abdomen

## 2016-01-07 MED ORDER — FENTANYL CITRATE (PF) 250 MCG/5ML IJ SOLN
INTRAMUSCULAR | Status: AC
  Filled 2016-01-07: qty 5

## 2016-01-07 MED ORDER — BUPIVACAINE HCL (PF) 0.5 % IJ SOLN
INTRAMUSCULAR | Status: AC
  Filled 2016-01-07: qty 30

## 2016-01-07 MED ORDER — OXYCODONE HCL 5 MG PO TABS: 5.0000 mg | ORAL_TABLET | Freq: Once | ORAL | Status: DC | PRN

## 2016-01-07 MED ORDER — EPHEDRINE SULFATE 50 MG/ML IJ SOLN
INTRAMUSCULAR | Status: DC | PRN
  Administered 2016-01-07 (×2): 5 mg via INTRAVENOUS

## 2016-01-07 MED ORDER — FENTANYL CITRATE (PF) 100 MCG/2ML IJ SOLN
INTRAMUSCULAR | Status: DC | PRN
  Administered 2016-01-07: 50 ug via INTRAVENOUS
  Administered 2016-01-07: 25 ug via INTRAVENOUS
  Administered 2016-01-07: 100 ug via INTRAVENOUS
  Administered 2016-01-07: 50 ug via INTRAVENOUS
  Administered 2016-01-07: 25 ug via INTRAVENOUS

## 2016-01-07 MED ORDER — DEXAMETHASONE SODIUM PHOSPHATE 10 MG/ML IJ SOLN
INTRAMUSCULAR | Status: DC | PRN
  Administered 2016-01-07: 10 mg via INTRAVENOUS

## 2016-01-07 MED ORDER — HYDROMORPHONE HCL 1 MG/ML IJ SOLN
INTRAMUSCULAR | Status: AC
Start: 2016-01-07 — End: 2016-01-07
  Filled 2016-01-07: qty 1

## 2016-01-07 MED ORDER — ACETAMINOPHEN 650 MG RE SUPP: 650.0000 mg | Freq: Four times a day (QID) | RECTAL | Status: DC | PRN

## 2016-01-07 MED ORDER — ONDANSETRON HCL 4 MG/2ML IJ SOLN: 4.0000 mg | Freq: Four times a day (QID) | INTRAMUSCULAR | Status: DC | PRN

## 2016-01-07 MED ORDER — MIDAZOLAM HCL 5 MG/5ML IJ SOLN
INTRAMUSCULAR | Status: DC | PRN
  Administered 2016-01-07: 2 mg via INTRAVENOUS

## 2016-01-07 MED ORDER — ROCURONIUM BROMIDE 100 MG/10ML IV SOLN
INTRAVENOUS | Status: AC
  Filled 2016-01-07: qty 1

## 2016-01-07 MED ORDER — LIDOCAINE HCL (CARDIAC) 20 MG/ML IV SOLN
INTRAVENOUS | Status: AC
  Filled 2016-01-07: qty 5

## 2016-01-07 MED ORDER — ENOXAPARIN SODIUM 40 MG/0.4ML ~~LOC~~ SOLN
40.0000 mg | SUBCUTANEOUS | Status: DC
  Administered 2016-01-08 – 2016-01-09 (×2): 40 mg via SUBCUTANEOUS
  Filled 2016-01-07 (×2): qty 0.4

## 2016-01-07 MED ORDER — CYANOCOBALAMIN 500 MCG PO TABS
500.0000 ug | ORAL_TABLET | Freq: Every day | ORAL | Status: DC
  Administered 2016-01-08 – 2016-01-09 (×2): 500 ug via ORAL
  Filled 2016-01-07 (×3): qty 1

## 2016-01-07 MED ORDER — PROPOFOL 10 MG/ML IV BOLUS
INTRAVENOUS | Status: AC
  Filled 2016-01-07: qty 20

## 2016-01-07 MED ORDER — ONDANSETRON 4 MG PO TBDP: 4.0000 mg | ORAL_TABLET | Freq: Four times a day (QID) | ORAL | Status: DC | PRN

## 2016-01-07 MED ORDER — ONDANSETRON HCL 4 MG/2ML IJ SOLN
INTRAMUSCULAR | Status: DC | PRN
  Administered 2016-01-07: 4 mg via INTRAVENOUS

## 2016-01-07 MED ORDER — DEXAMETHASONE SODIUM PHOSPHATE 10 MG/ML IJ SOLN
INTRAMUSCULAR | Status: AC
Start: 2016-01-07 — End: 2016-01-07
  Filled 2016-01-07: qty 1

## 2016-01-07 MED ORDER — OXYCODONE HCL 5 MG/5ML PO SOLN
5.0000 mg | Freq: Once | ORAL | Status: DC | PRN
  Filled 2016-01-07: qty 5

## 2016-01-07 MED ORDER — HYDROMORPHONE HCL 1 MG/ML IJ SOLN
0.2500 mg | INTRAMUSCULAR | Status: DC | PRN
  Administered 2016-01-07 (×2): 0.5 mg via INTRAVENOUS

## 2016-01-07 MED ORDER — PHENYLEPHRINE HCL 10 MG/ML IJ SOLN
INTRAMUSCULAR | Status: DC | PRN
  Administered 2016-01-07: 80 ug via INTRAVENOUS
  Administered 2016-01-07: 40 ug via INTRAVENOUS
  Administered 2016-01-07 (×2): 80 ug via INTRAVENOUS

## 2016-01-07 MED ORDER — MIDAZOLAM HCL 2 MG/2ML IJ SOLN
INTRAMUSCULAR | Status: AC
  Filled 2016-01-07: qty 2

## 2016-01-07 MED ORDER — BRIMONIDINE TARTRATE 0.15 % OP SOLN
1.0000 [drp] | Freq: Two times a day (BID) | OPHTHALMIC | Status: DC
  Administered 2016-01-07 – 2016-01-09 (×4): 1 [drp] via OPHTHALMIC
  Filled 2016-01-07: qty 5

## 2016-01-07 MED ORDER — PROPOFOL 10 MG/ML IV BOLUS
INTRAVENOUS | Status: DC | PRN
Start: 2016-01-07 — End: 2016-01-07
  Administered 2016-01-07: 150 mg via INTRAVENOUS
  Administered 2016-01-07: 30 mg via INTRAVENOUS

## 2016-01-07 MED ORDER — LACTATED RINGERS IV SOLN: INTRAVENOUS | Status: DC

## 2016-01-07 MED ORDER — LEVOTHYROXINE SODIUM 75 MCG PO TABS
175.0000 ug | ORAL_TABLET | Freq: Every day | ORAL | Status: DC
  Administered 2016-01-08 – 2016-01-09 (×2): 175 ug via ORAL
  Filled 2016-01-07 (×2): qty 1

## 2016-01-07 MED ORDER — SERTRALINE HCL 50 MG PO TABS
100.0000 mg | ORAL_TABLET | Freq: Every day | ORAL | Status: DC
  Administered 2016-01-08 – 2016-01-09 (×2): 100 mg via ORAL
  Filled 2016-01-07 (×2): qty 2

## 2016-01-07 MED ORDER — SUGAMMADEX SODIUM 500 MG/5ML IV SOLN
INTRAVENOUS | Status: AC
  Filled 2016-01-07: qty 5

## 2016-01-07 MED ORDER — ROCURONIUM BROMIDE 100 MG/10ML IV SOLN
INTRAVENOUS | Status: DC | PRN
  Administered 2016-01-07: 40 mg via INTRAVENOUS
  Administered 2016-01-07: 10 mg via INTRAVENOUS

## 2016-01-07 MED ORDER — 0.9 % SODIUM CHLORIDE (POUR BTL) OPTIME
TOPICAL | Status: DC | PRN
  Administered 2016-01-07: 1000 mL

## 2016-01-07 MED ORDER — DOXAZOSIN MESYLATE 4 MG PO TABS
4.0000 mg | ORAL_TABLET | Freq: Every day | ORAL | Status: DC
  Administered 2016-01-08 – 2016-01-09 (×2): 4 mg via ORAL
  Filled 2016-01-07 (×3): qty 1

## 2016-01-07 MED ORDER — BUPIVACAINE HCL (PF) 0.5 % IJ SOLN
INTRAMUSCULAR | Status: DC | PRN
  Administered 2016-01-07: 20 mL

## 2016-01-07 MED ORDER — AMLODIPINE BESYLATE 5 MG PO TABS
5.0000 mg | ORAL_TABLET | Freq: Every day | ORAL | Status: DC
  Administered 2016-01-08 – 2016-01-09 (×2): 5 mg via ORAL
  Filled 2016-01-07 (×2): qty 1

## 2016-01-07 MED ORDER — PROMETHAZINE HCL 25 MG/ML IJ SOLN: 12.5000 mg | Freq: Once | INTRAMUSCULAR | Status: DC | PRN

## 2016-01-07 MED ORDER — MORPHINE SULFATE (PF) 2 MG/ML IV SOLN
1.0000 mg | INTRAVENOUS | Status: DC | PRN
  Administered 2016-01-07 (×2): 2 mg via INTRAVENOUS
  Filled 2016-01-07 (×2): qty 1

## 2016-01-07 MED ORDER — SUCCINYLCHOLINE CHLORIDE 20 MG/ML IJ SOLN
INTRAMUSCULAR | Status: DC | PRN
  Administered 2016-01-07: 100 mg via INTRAVENOUS

## 2016-01-07 MED ORDER — ACETAMINOPHEN 325 MG PO TABS: 650.0000 mg | ORAL_TABLET | Freq: Four times a day (QID) | ORAL | Status: DC | PRN

## 2016-01-07 MED ORDER — OXYCODONE HCL 5 MG PO TABS
5.0000 mg | ORAL_TABLET | ORAL | Status: DC | PRN
  Administered 2016-01-07: 10 mg via ORAL
  Administered 2016-01-08 – 2016-01-09 (×5): 5 mg via ORAL
  Filled 2016-01-07 (×3): qty 1
  Filled 2016-01-07: qty 2
  Filled 2016-01-07 (×2): qty 1

## 2016-01-07 MED ORDER — LIDOCAINE HCL (CARDIAC) 20 MG/ML IV SOLN
INTRAVENOUS | Status: DC | PRN
  Administered 2016-01-07: 50 mg via INTRAVENOUS

## 2016-01-07 MED ORDER — SODIUM CHLORIDE 0.9 % IV SOLN
INTRAVENOUS | Status: DC
  Administered 2016-01-07 – 2016-01-08 (×2): via INTRAVENOUS

## 2016-01-07 MED ORDER — ONDANSETRON HCL 4 MG/2ML IJ SOLN
INTRAMUSCULAR | Status: AC
Start: 2016-01-07 — End: 2016-01-07
  Filled 2016-01-07: qty 2

## 2016-01-07 MED ORDER — LACTATED RINGERS IV SOLN
INTRAVENOUS | Status: DC | PRN
  Administered 2016-01-07 (×2): via INTRAVENOUS

## 2016-01-07 MED ORDER — SUGAMMADEX SODIUM 200 MG/2ML IV SOLN
INTRAVENOUS | Status: DC | PRN
  Administered 2016-01-07: 400 mg via INTRAVENOUS
  Administered 2016-01-07: 200 mg via INTRAVENOUS

## 2016-01-07 SURGICAL SUPPLY — 29 items
BINDER ABDOMINAL 12 ML 46-62 (SOFTGOODS) IMPLANT
BLADE HEX COATED 2.75 (ELECTRODE) ×3 IMPLANT
CHLORAPREP W/TINT 26ML (MISCELLANEOUS) ×3 IMPLANT
COVER SURGICAL LIGHT HANDLE (MISCELLANEOUS) ×3 IMPLANT
DRAPE LAPAROSCOPIC ABDOMINAL (DRAPES) ×3 IMPLANT
ELECT REM PT RETURN 9FT ADLT (ELECTROSURGICAL) ×3
ELECTRODE REM PT RTRN 9FT ADLT (ELECTROSURGICAL) ×1 IMPLANT
GLOVE BIOGEL PI IND STRL 7.0 (GLOVE) ×2 IMPLANT
GLOVE BIOGEL PI IND STRL 7.5 (GLOVE) ×1 IMPLANT
GLOVE BIOGEL PI INDICATOR 7.0 (GLOVE) ×4
GLOVE BIOGEL PI INDICATOR 7.5 (GLOVE) ×2
GLOVE SURG SIGNA 7.5 PF LTX (GLOVE) ×6 IMPLANT
GLOVE SURG SS PI 7.0 STRL IVOR (GLOVE) ×3 IMPLANT
GLOVE SURG SS PI 7.5 STRL IVOR (GLOVE) ×3 IMPLANT
GOWN STRL REUS W/TWL LRG LVL3 (GOWN DISPOSABLE) ×3 IMPLANT
GOWN STRL REUS W/TWL XL LVL3 (GOWN DISPOSABLE) ×6 IMPLANT
KIT BASIN OR (CUSTOM PROCEDURE TRAY) ×3 IMPLANT
LIQUID BAND (GAUZE/BANDAGES/DRESSINGS) ×3 IMPLANT
MESH VENTRALEX ST 8CM LRG (Mesh General) ×3 IMPLANT
NEEDLE HYPO 22GX1.5 SAFETY (NEEDLE) ×3 IMPLANT
PACK GENERAL/GYN (CUSTOM PROCEDURE TRAY) ×3 IMPLANT
SEALER TISSUE X1 CVD JAW (INSTRUMENTS) ×3 IMPLANT
SUT MNCRL AB 4-0 PS2 18 (SUTURE) ×3 IMPLANT
SUT NOVA 1 T20/GS 25DT (SUTURE) ×6 IMPLANT
SUT SILK 2 0 SH CR/8 (SUTURE) ×3 IMPLANT
SUT VIC AB 3-0 SH 27 (SUTURE) ×3
SUT VIC AB 3-0 SH 27X BRD (SUTURE) ×1 IMPLANT
SYR CONTROL 10ML LL (SYRINGE) IMPLANT
TOWEL OR 17X26 10 PK STRL BLUE (TOWEL DISPOSABLE) ×3 IMPLANT

## 2016-01-07 NOTE — Op Note (Signed)
VENTRAL HERNIA REPAIR, INSERTION OF MESH  Procedure Note  Tracy Sampson 01/07/2016   Pre-op Diagnosis: incarcerated ventral hernia        Post-op Diagnosis: incarcerated ventral hernia  Procedure(s): VENTRAL HERNIA REPAIR INSERTION OF MESH (8 cm round Randon Goldsmith ventral patch)  Surgeon(s): Coralie Keens, MD  Anesthesia: General  Staff:  Circulator: Rickard Rhymes, RN Scrub Person: Joesphine Bare, RN; Johnn Hai, CST  Estimated Blood Loss: Minimal                         Tracy Sampson   Date: 01/07/2016  Time: 9:18 AM

## 2016-01-07 NOTE — Transfer of Care (Signed)
Immediate Anesthesia Transfer of Care Note  Patient: Tracy Sampson  Procedure(s) Performed: Procedure(s): VENTRAL HERNIA REPAIR (N/A) INSERTION OF MESH (N/A)  Patient Location: PACU  Anesthesia Type:General  Level of Consciousness:  sedated, patient cooperative and responds to stimulation  Airway & Oxygen Therapy:Patient Spontanous Breathing and Patient connected to face mask oxgen  Post-op Assessment:  Report given to PACU RN and Post -op Vital signs reviewed and stable  Post vital signs:  Reviewed and stable  Last Vitals:  Vitals:   01/07/16 0618  BP: 128/73  Pulse: 92  Resp: 18  Temp: [id number] C    Complications: No apparent anesthesia complications

## 2016-01-07 NOTE — Anesthesia Postprocedure Evaluation (Signed)
Anesthesia Post Note  Patient: Tracy Sampson  Procedure(s) Performed: Procedure(s) (LRB): VENTRAL HERNIA REPAIR (N/A) INSERTION OF MESH (N/A)  Patient location during evaluation: PACU Anesthesia Type: General Level of consciousness: awake and alert Pain management: pain level controlled Vital Signs Assessment: post-procedure vital signs reviewed and stable Respiratory status: spontaneous breathing, nonlabored ventilation, respiratory function stable and patient connected to nasal cannula oxygen Cardiovascular status: blood pressure returned to baseline and stable Postop Assessment: no signs of nausea or vomiting Anesthetic complications: no Comments: OSA orders in, she will need continuous pulse ox or EtCO2 monitoring and CPAP during any sleep time while requiring opioid medications in hospital    Last Vitals:  Vitals:   01/07/16 1015 01/07/16 1030  BP: (!) 176/84 (!) 171/76  Pulse: 83 78  Resp: 20 19  Temp:      Last Pain:  Vitals:   01/07/16 1030  TempSrc:   PainSc: 4                  Zenaida Deed

## 2016-01-07 NOTE — Op Note (Signed)
Tracy Sampson, Tracy Sampson             ACCOUNT NO.:  [id number]  MEDICAL RECORD NO.:  [id number]  LOCATION:  [facility]                         FACILITY:  [facility]  PHYSICIAN:  Coralie Keens, M.D. DATE OF BIRTH:  1956/01/22  DATE OF PROCEDURE:  01/07/2016 DATE OF DISCHARGE:                              OPERATIVE REPORT   PREOPERATIVE DIAGNOSIS:  Incarcerated ventral hernia.  POSTOPERATIVE DIAGNOSIS:  Incarcerated ventral hernia.  PROCEDURE:  Repair of incarcerated ventral hernia with mesh.  SURGEON:  Coralie Keens, MD  ANESTHESIA:  General and 0.5% Marcaine.  ESTIMATED BLOOD LOSS:  Minimal.  INDICATIONS:  This is a 60 year old female, with a chronically incarcerated ventral hernia above the umbilicus.  It contains large amount of omentum.  The decision then made to proceed with hernia repair with mesh.  FINDINGS:  The patient was actually found to have a small fascial defect.  There was a large amount of omentum incarcerated into the hernia.  The actual fascial defect itself was repaired with an 8 cm round ventral ST patch from [facility].  PROCEDURE IN DETAIL:  The patient was brought to the operating room, identified as Tracy Sampson.  She was placed supine on the operating room table and general anesthesia was induced.  Her abdomen was then prepped and draped in the usual sterile fashion.  I made a small midline incision above the umbilicus with a scalpel.  I took this down to the hernia sac which I identified circumferentially with the cautery.  I then entered the sac.  There was a large amount of omentum tethered into the sac.  The sac was very thick walled as well.  At this point, I had to free all attachments up the omentum around the fascial edges with electrocautery.  I tied off 2 bleeding areas in the omentum with 2-0 silk sutures.  I then was able to take the rest of it down with the LigaSure device.  I was then able to reduce the contents back into the abdominal cavity.   I then excised the very thickened hernia sac with the LigaSure as well.  Once this was completed, the fascial defect was identified circumferentially.  I brought an 8 cm round Ventralight ST patch from [facility] onto the field.  I placed it through the fascial opening and then pulled it up against the peritoneum with stay ties.  I then sewed the mesh and circumferentially with #1 Novafil sutures.  I then cut the stay ties and then sewed the fascia over the top of the mesh, closing the fascia with interrupted and figure-of-eight #1 Novafil sutures.  Wide coverage of the fascial defect and closure appeared to be achieved.  At this point, I anesthetized the fascia and skin with Marcaine.  I then closed subcutaneous tissue with interrupted 3-0 Vicryl sutures and closed the skin with a running 4-0 Monocryl.  Skin glue was then applied.  The patient tolerated the procedure well.  All sponge, needle, and instrument counts were correct at the end of procedure.  The patient was then extubated in the operating room and taken in a stable condition to recovery room.     Coralie Keens, M.D.     DB/MEDQ  D:  01/07/2016  T:  01/07/2016  Job:  [id number]

## 2016-01-07 NOTE — Anesthesia Procedure Notes (Signed)
Procedure Name: Intubation Date/Time: 01/07/2016 8:14 AM Performed by: West Pugh Pre-anesthesia Checklist: Patient identified, Emergency Drugs available, Suction available, Patient being monitored and Timeout performed Patient Re-evaluated:Patient Re-evaluated prior to inductionOxygen Delivery Method: Circle system utilized Preoxygenation: Pre-oxygenation with 100% oxygen Intubation Type: IV induction and Cricoid Pressure applied Ventilation: Mask ventilation without difficulty Laryngoscope Size: Mac and 4 Grade View: Grade II Tube type: Oral Tube size: 7.5 mm Number of attempts: 2 Airway Equipment and Method: Stylet Placement Confirmation: ETT inserted through vocal cords under direct vision,  positive ETCO2,  CO2 detector and breath sounds checked- equal and bilateral Secured at: 23 cm Tube secured with: Tape Dental Injury: Teeth and Oropharynx as per pre-operative assessment  Comments: Paramedic student DL x 1 attempt-no view. CRNA DL x1 with successful attempt. Grade 2 view with cricoid pressure.

## 2016-01-07 NOTE — Interval H&P Note (Signed)
History and Physical Interval Note:no change in H and P  01/07/2016 6:48 AM  Tracy Sampson  has presented today for surgery, with the diagnosis of ventral hernia     The various methods of treatment have been discussed with the patient and family. After consideration of risks, benefits and other options for treatment, the patient has consented to  Procedure(s): VENTRAL HERNIA REPAIR (N/A) INSERTION OF MESH (N/A) as a surgical intervention .  The patient's history has been reviewed, patient examined, no change in status, stable for surgery.  I have reviewed the patient's chart and labs.  Questions were answered to the patient's satisfaction.     Ednah Hammock A

## 2016-01-08 ENCOUNTER — Encounter (HOSPITAL_COMMUNITY): Payer: Self-pay | Admitting: Surgery

## 2016-01-08 DIAGNOSIS — K436 Other and unspecified ventral hernia with obstruction, without gangrene: Secondary | ICD-10-CM | POA: Diagnosis not present

## 2016-01-08 NOTE — Progress Notes (Signed)
1 Day Post-Op  Subjective: Having some pain control issues and low O2 sats  Objective: Vital signs in last 24 hours: Temp:  [97.4 F (36.3 C)-98.4 F (36.9 C)] 97.5 F (36.4 C) (08/23 0537) Pulse Rate:  [55-100] 100 (08/23 0537) Resp:  [15-20] 16 (08/23 0537) BP: (133-183)/(64-84) 145/64 (08/23 0537) SpO2:  [93 %-100 %] 93 % (08/23 0537)    Intake/Output from previous day: 08/22 0701 - 08/23 0700 In: 2758.3 [P.O.:180; I.V.:2578.3] Out: 970 [Urine:950; Blood:20] Intake/Output this shift: No intake/output data recorded.  Lungs with a little rhonchi Abdomen soft with moderate incisional tenderness  Lab Results:  No results for input(s): WBC, HGB, HCT, PLT in the last 72 hours. BMET No results for input(s): NA, K, CL, CO2, GLUCOSE, BUN, CREATININE, CALCIUM in the last 72 hours. PT/INR No results for input(s): LABPROT, INR in the last 72 hours. ABG No results for input(s): PHART, HCO3 in the last 72 hours.  Invalid input(s): PCO2, PO2  Studies/Results: No results found.  Anti-infectives: Anti-infectives    Start     Dose/Rate Route Frequency Ordered Stop   01/07/16 0600  ceFAZolin (ANCEF) 3 g in dextrose 5 % 50 mL IVPB     3 g 130 mL/hr over 30 Minutes Intravenous On call to O.R. 01/06/16 1326 01/07/16 0830      Assessment/Plan: s/p Procedure(s): VENTRAL HERNIA REPAIR (N/A) INSERTION OF MESH (N/A)  Having some difficulty given her morbid obesity. I need to keep her until tomorrow for better pain control and to improve her pulmonary status. Needs to work on IS and ambulate  LOS: 0 days    Anyra Kaufman A 01/08/2016

## 2016-01-09 DIAGNOSIS — K436 Other and unspecified ventral hernia with obstruction, without gangrene: Secondary | ICD-10-CM | POA: Diagnosis not present

## 2016-01-09 MED ORDER — OXYCODONE-ACETAMINOPHEN 5-325 MG PO TABS: 1.0000 | ORAL_TABLET | ORAL | 0 refills | Status: DC | PRN

## 2016-01-09 NOTE — Discharge Summary (Signed)
Physician Discharge Summary  Patient ID: DALAYAH STALLING MRN: [id number] DOB/AGE: [birth year] 61 y.o.  Admit date: 01/07/2016 Discharge date: 01/09/2016  Admission Diagnoses:  Discharge Diagnoses:  Active Problems:   Incarcerated ventral hernia   Discharged Condition: good  Hospital Course: uneventful post op recovery.   Kept an extra day because of pain control and morbid obesity  Consults: None  Significant Diagnostic Studies:   Treatments: surgery: repair incarcerated ventral hernia with mesh  Discharge Exam: Blood pressure (!) 150/74, pulse 71, temperature 98 F (36.7 C), temperature source Axillary, resp. rate 16, height 5\' 6"  (1.676 m), weight 135.6 kg (299 lb), SpO2 99 %. General appearance: alert, cooperative and no distress Resp: clear to auscultation bilaterally Cardio: regular rate and rhythm, S1, S2 normal, no murmur, click, rub or gallop Incision/Wound:abdomen soft, obese, incision clean  Disposition:      Medication List    TAKE these medications   Tracy Sampson 0.1 % Soln Generic drug:  brimonidine Place 1 drop into both eyes 2 (two) times daily.   amLODipine 5 MG tablet Commonly known as:  NORVASC Take 1 tablet (5 mg total) by mouth daily.   cholecalciferol 1000 units tablet Commonly known as:  VITAMIN D Take 1 tablet (1,000 Units total) by mouth daily.   doxazosin 4 MG tablet Commonly known as:  CARDURA Take 1 tablet (4 mg total) by mouth daily.   ibuprofen 600 MG tablet Commonly known as:  ADVIL,MOTRIN Take 1 tablet (600 mg total) by mouth every 8 (eight) hours as needed. What changed:  reasons to take this   levothyroxine 175 MCG tablet Commonly known as:  SYNTHROID, LEVOTHROID Take 1 tablet (175 mcg total) by mouth daily.   oxyCODONE-acetaminophen 5-325 MG tablet Commonly known as:  ROXICET Take 1-2 tablets by mouth every 4 (four) hours as needed for moderate pain or severe pain.   sertraline 100 MG tablet Commonly known as:   ZOLOFT Take 1 tablet (100 mg total) by mouth daily.   vitamin B-12 500 MCG tablet Commonly known as:  CYANOCOBALAMIN Take 500 mcg by mouth daily.      Follow-up Information    Harl Bowie, MD .   Specialty:  General Surgery Contact information: [address] [place] [address] [phone]           Signed: Harl Bowie 01/09/2016, 7:08 AM

## 2016-01-09 NOTE — Discharge Instructions (Signed)
[facility] _______[facility]  UMBILICAL OR INGUINAL HERNIA REPAIR: POST OP INSTRUCTIONS  Always review your discharge instruction sheet given to you by the facility where your surgery was performed. IF YOU HAVE DISABILITY OR FAMILY LEAVE FORMS, YOU MUST BRING THEM TO THE OFFICE FOR PROCESSING.   DO NOT GIVE THEM TO YOUR DOCTOR.  1. A  prescription for pain medication may be given to you upon discharge.  Take your pain medication as prescribed, if needed.  If narcotic pain medicine is not needed, then you may take acetaminophen (Tylenol) or ibuprofen (Advil) as needed. 2. Take your usually prescribed medications unless otherwise directed. 3. If you need a refill on your pain medication, please contact your pharmacy.  They will contact our office to request authorization. Prescriptions will not be filled after 5 pm or on week-ends. 4. You should follow a light diet the first 24 hours after arrival home, such as soup and crackers, etc.  Be sure to include lots of fluids daily.  Resume your normal diet the day after surgery. 5. Most patients will experience some swelling and bruising around the umbilicus or in the groin and scrotum.  Ice packs and reclining will help.  Swelling and bruising can take several days to resolve.  6. It is common to experience some constipation if taking pain medication after surgery.  Increasing fluid intake and taking a stool softener (such as Colace) will usually help or prevent this problem from occurring.  A mild laxative (Milk of Magnesia or Miralax) should be taken according to package directions if there are no bowel movements after 48 hours. 7. Unless discharge instructions indicate otherwise, you may remove your bandages 24-48 hours after surgery, and you may shower at that time.  You may have steri-strips (small skin tapes) in place directly over the incision.  These strips should be left on the skin for 7-10 days.  If your surgeon used skin glue on the  incision, you may shower in 24 hours.  The glue will flake off over the next 2-3 weeks.  Any sutures or staples will be removed at the office during your follow-up visit. 8. ACTIVITIES:  You may resume regular (light) daily activities beginning the next day--such as daily self-care, walking, climbing stairs--gradually increasing activities as tolerated.  You may have sexual intercourse when it is comfortable.  Refrain from any heavy lifting or straining until approved by your doctor. a. You may drive when you are no longer taking prescription pain medication, you can comfortably wear a seatbelt, and you can safely maneuver your car and apply brakes. b. RETURN TO WORK:  __________________________________________________________ 9. You should see your doctor in the office for a follow-up appointment approximately 2-3 weeks after your surgery.  Make sure that you call for this appointment within a day or two after you arrive home to insure a convenient appointment time. 10. OTHER INSTRUCTIONS: NO LIFTING MORE THAN 15 TO 20 POUNDS FOR 4 WEEKS 11. ICE PACK AND IBUPROFEN ALSO FOR PAIN 12. OK TO SHOWER __________________________________________________________________________________________________________________________________________________________________________________________  WHEN TO CALL YOUR DOCTOR: 1. Fever over 101.0 2. Inability to urinate 3. Nausea and/or vomiting 4. Extreme swelling or bruising 5. Continued bleeding from incision. 6. Increased pain, redness, or drainage from the incision  The clinic staff is available to answer your questions during regular business hours.  Please dont hesitate to call and ask to speak to one of the nurses for clinical concerns.  If you have a medical emergency, go to the  nearest emergency room or call 911.  A surgeon from [facility] is always on call at the hospital   [address], [place], [place], [place]  [address] ?  [address], [place], [place]   [address] [phone] ? [phone] ? FAX [phone] Web site: [email or web address]

## 2016-01-09 NOTE — Progress Notes (Signed)
Patient ID: Tracy Sampson, female   DOB: 09-24-1955, 60 y.o.   MRN: [id number]   Doing better Lungs clear Abdomen soft  Plan: Discharge home

## 2016-01-29 ENCOUNTER — Ambulatory Visit (INDEPENDENT_AMBULATORY_CARE_PROVIDER_SITE_OTHER): Payer: Managed Care, Other (non HMO) | Admitting: Internal Medicine

## 2016-01-29 ENCOUNTER — Encounter: Payer: Self-pay | Admitting: Internal Medicine

## 2016-01-29 VITALS — BP 139/89 | HR 80 | Wt 299.0 lb

## 2016-01-29 DIAGNOSIS — E034 Atrophy of thyroid (acquired): Secondary | ICD-10-CM

## 2016-01-29 DIAGNOSIS — E038 Other specified hypothyroidism: Secondary | ICD-10-CM

## 2016-01-29 DIAGNOSIS — R32 Unspecified urinary incontinence: Secondary | ICD-10-CM

## 2016-01-29 DIAGNOSIS — R739 Hyperglycemia, unspecified: Secondary | ICD-10-CM | POA: Diagnosis not present

## 2016-01-29 DIAGNOSIS — K439 Ventral hernia without obstruction or gangrene: Secondary | ICD-10-CM

## 2016-01-29 DIAGNOSIS — N393 Stress incontinence (female) (male): Secondary | ICD-10-CM

## 2016-01-29 DIAGNOSIS — L748 Other eccrine sweat disorders: Secondary | ICD-10-CM

## 2016-01-29 DIAGNOSIS — K219 Gastro-esophageal reflux disease without esophagitis: Secondary | ICD-10-CM | POA: Diagnosis not present

## 2016-01-29 DIAGNOSIS — I10 Essential (primary) hypertension: Secondary | ICD-10-CM

## 2016-01-29 DIAGNOSIS — L750 Bromhidrosis: Secondary | ICD-10-CM

## 2016-01-29 NOTE — Patient Instructions (Signed)
Kegel Exercises  The goal of Kegel exercises is to isolate and exercise your pelvic floor muscles. These muscles act as a hammock that supports the rectum, vagina, small intestine, and uterus. As the muscles weaken, the hammock sags and these organs are displaced from their normal positions. Kegel exercises can strengthen your pelvic floor muscles and help you to improve bladder and bowel control, improve sexual response, and help reduce many problems and some discomfort during pregnancy. Kegel exercises can be done anywhere and at any time.  HOW TO PERFORM KEGEL EXERCISES  1. Locate your pelvic floor muscles. To do this, squeeze (contract) the muscles that you use when you try to stop the flow of urine. You will feel a tightness in the vaginal area (women) and a tight lift in the rectal area (men and women).  2. When you begin, contract your pelvic muscles tight for 2-5 seconds, then relax them for 2-5 seconds. This is one set. Do 4-5 sets with a short pause in between.  3. Contract your pelvic muscles for 8-10 seconds, then relax them for 8-10 seconds. Do 4-5 sets. If you cannot contract your pelvic muscles for 8-10 seconds, try 5-7 seconds and work your way up to 8-10 seconds. Your goal is 4-5 sets of 10 contractions each day.  Keep your stomach, buttocks, and legs relaxed during the exercises. Perform sets of both short and long contractions. Vary your positions. Perform these contractions 3-4 times per day. Perform sets while you are:    · Lying in bed in the morning.  · Standing at lunch.  · Sitting in the late afternoon.  · Lying in bed at night.   You should do 40-50 contractions per day. Do not perform more Kegel exercises per day than recommended. Overexercising can cause muscle fatigue. Continue these exercises for for at least 15-20 weeks or as directed by your caregiver.     This information is not intended to replace advice given to you by your health care provider. Make sure you discuss any questions  you have with your health care provider.     Document Released: 04/20/2012 Document Revised: 05/25/2014 Document Reviewed: 04/20/2012  Elsevier Interactive Patient Education ©2016 Elsevier Inc.

## 2016-01-29 NOTE — Assessment & Plan Note (Signed)
On Levoxyl 

## 2016-01-29 NOTE — Progress Notes (Signed)
Subjective:  Patient ID: Tracy Sampson, female    DOB: [birth year]  Age: 60 y.o. MRN: [id number]  CC: No chief complaint on file.   HPI DESTYNE DURAN presents for hernia, HTN, obesity, hypothyroidism f/u  Outpatient Medications Prior to Visit  Medication Sig Dispense Refill  . ALPHAGAN P 0.1 % SOLN Place 1 drop into both eyes 2 (two) times daily.  0  . amLODipine (NORVASC) 5 MG tablet Take 1 tablet (5 mg total) by mouth daily. 90 tablet 3  . cholecalciferol (VITAMIN D) 1000 units tablet Take 1 tablet (1,000 Units total) by mouth daily. 100 tablet 3  . doxazosin (CARDURA) 4 MG tablet Take 1 tablet (4 mg total) by mouth daily. 90 tablet 3  . ibuprofen (ADVIL,MOTRIN) 600 MG tablet Take 1 tablet (600 mg total) by mouth every 8 (eight) hours as needed. (Patient taking differently: Take 600 mg by mouth every 8 (eight) hours as needed for moderate pain. ) 90 tablet 1  . levothyroxine (SYNTHROID, LEVOTHROID) 175 MCG tablet Take 1 tablet (175 mcg total) by mouth daily. 90 tablet 3  . oxyCODONE-acetaminophen (ROXICET) 5-325 MG tablet Take 1-2 tablets by mouth every 4 (four) hours as needed for moderate pain or severe pain. 50 tablet 0  . sertraline (ZOLOFT) 100 MG tablet Take 1 tablet (100 mg total) by mouth daily. 90 tablet 3  . vitamin B-12 (CYANOCOBALAMIN) 500 MCG tablet Take 500 mcg by mouth daily.     No facility-administered medications prior to visit.     ROS Review of Systems  Constitutional: Negative for activity change, appetite change, chills, fatigue and unexpected weight change.  HENT: Negative for congestion, mouth sores and sinus pressure.   Eyes: Negative for visual disturbance.  Respiratory: Negative for cough and chest tightness.   Gastrointestinal: Negative for abdominal pain and nausea.  Genitourinary: Negative for difficulty urinating, frequency and vaginal pain.  Musculoskeletal: Negative for back pain and gait problem.  Skin: Negative for pallor and rash.    Neurological: Negative for dizziness, tremors, weakness, numbness and headaches.  Psychiatric/Behavioral: Negative for confusion and sleep disturbance.    Objective:  BP 140/90   Pulse 80   Wt 299 lb (135.6 kg)   SpO2 97%   BMI 48.26 kg/m   BP Readings from Last 3 Encounters:  01/29/16 140/90  01/09/16 (!) 150/74  12/25/15 (!) 159/76    Wt Readings from Last 3 Encounters:  01/29/16 299 lb (135.6 kg)  01/07/16 299 lb (135.6 kg)  12/25/15 299 lb (135.6 kg)    Physical Exam  Constitutional: She appears well-developed. No distress.  HENT:  Head: Normocephalic.  Right Ear: External ear normal.  Left Ear: External ear normal.  Nose: Nose normal.  Mouth/Throat: Oropharynx is clear and moist.  Eyes: Conjunctivae are normal. Pupils are equal, round, and reactive to light. Right eye exhibits no discharge. Left eye exhibits no discharge.  Neck: Normal range of motion. Neck supple. No JVD present. No tracheal deviation present. No thyromegaly present.  Cardiovascular: Normal rate, regular rhythm and normal heart sounds.   Pulmonary/Chest: No stridor. No respiratory distress. She has no wheezes.  Abdominal: Soft. Bowel sounds are normal. She exhibits no distension and no mass. There is no tenderness. There is no rebound and no guarding.  Musculoskeletal: She exhibits no edema or tenderness.  Lymphadenopathy:    She has no cervical adenopathy.  Neurological: She displays normal reflexes. No cranial nerve deficit. She exhibits normal muscle tone. Coordination normal.  Skin:  No rash noted. No erythema.  Psychiatric: She has a normal mood and affect. Her behavior is normal. Judgment and thought content normal.  Obese Midline scar is healing  Lab Results  Component Value Date   WBC 4.9 12/25/2015   HGB 12.0 12/25/2015   HCT 39.4 12/25/2015   PLT 258 12/25/2015   GLUCOSE 108 (H) 12/25/2015   CHOL 202 (H) 06/06/2015   TRIG 78.0 06/06/2015   HDL 43.50 06/06/2015   LDLDIRECT 149.3  10/23/2010   LDLCALC 143 (H) 06/06/2015   ALT 17 06/06/2015   AST 17 06/06/2015   NA 144 12/25/2015   K 4.0 12/25/2015   CL 109 12/25/2015   CREATININE 0.75 12/25/2015   BUN 10 12/25/2015   CO2 28 12/25/2015   TSH 1.42 06/06/2015   HGBA1C 5.5 06/06/2015    No results found.  Assessment & Plan:   There are no diagnoses linked to this encounter. I am having Ms. Esworthy maintain her vitamin B-12, ALPHAGAN P, amLODipine, doxazosin, levothyroxine, sertraline, ibuprofen, cholecalciferol, and oxyCODONE-acetaminophen.  No orders of the defined types were placed in this encounter.    Follow-up: No Follow-up on file.  Walker Kehr, MD

## 2016-01-29 NOTE — Assessment & Plan Note (Signed)
I do not smell any odor

## 2016-01-29 NOTE — Progress Notes (Signed)
Pre visit review using our clinic review tool, if applicable. No additional management support is needed unless otherwise documented below in the visit note. 

## 2016-01-29 NOTE — Assessment & Plan Note (Signed)
Resolved

## 2016-01-29 NOTE — Assessment & Plan Note (Signed)
S/p repair 2017 Dr Ninfa Linden

## 2016-01-29 NOTE — Assessment & Plan Note (Signed)
Options discussed 

## 2016-01-29 NOTE — Assessment & Plan Note (Signed)
On Amlodipine, cardura She denies any previous allergies to meds.  She declined diuretics.  NAS diet

## 2016-02-20 ENCOUNTER — Ambulatory Visit: Payer: Managed Care, Other (non HMO) | Admitting: Pulmonary Disease

## 2016-02-28 ENCOUNTER — Other Ambulatory Visit: Payer: Self-pay | Admitting: *Deleted

## 2016-02-28 MED ORDER — DOXAZOSIN MESYLATE 4 MG PO TABS: 4.0000 mg | ORAL_TABLET | Freq: Every day | ORAL | 0 refills | Status: DC

## 2016-02-28 MED ORDER — SERTRALINE HCL 100 MG PO TABS: 100.0000 mg | ORAL_TABLET | Freq: Every day | ORAL | 0 refills | Status: DC

## 2016-02-28 MED ORDER — LEVOTHYROXINE SODIUM 175 MCG PO TABS: 175.0000 ug | ORAL_TABLET | Freq: Every day | ORAL | 0 refills | Status: DC

## 2016-02-28 MED ORDER — AMLODIPINE BESYLATE 5 MG PO TABS: 5.0000 mg | ORAL_TABLET | Freq: Every day | ORAL | 0 refills | Status: DC

## 2016-04-03 ENCOUNTER — Encounter (HOSPITAL_COMMUNITY): Payer: Self-pay

## 2016-05-23 ENCOUNTER — Other Ambulatory Visit: Payer: Self-pay | Admitting: Internal Medicine

## 2016-06-01 ENCOUNTER — Ambulatory Visit: Payer: Managed Care, Other (non HMO) | Admitting: Internal Medicine

## 2016-06-12 ENCOUNTER — Ambulatory Visit: Payer: Managed Care, Other (non HMO) | Admitting: Internal Medicine

## 2016-08-26 ENCOUNTER — Other Ambulatory Visit: Payer: Self-pay | Admitting: Internal Medicine

## 2016-08-26 DIAGNOSIS — Z1231 Encounter for screening mammogram for malignant neoplasm of breast: Secondary | ICD-10-CM

## 2016-09-17 DIAGNOSIS — Z83511 Family history of glaucoma: Secondary | ICD-10-CM | POA: Insufficient documentation

## 2016-09-17 DIAGNOSIS — Z961 Presence of intraocular lens: Secondary | ICD-10-CM | POA: Insufficient documentation

## 2016-09-24 ENCOUNTER — Encounter (HOSPITAL_BASED_OUTPATIENT_CLINIC_OR_DEPARTMENT_OTHER): Payer: Self-pay

## 2016-09-24 ENCOUNTER — Ambulatory Visit (HOSPITAL_BASED_OUTPATIENT_CLINIC_OR_DEPARTMENT_OTHER)
Admission: RE | Admit: 2016-09-24 | Discharge: 2016-09-24 | Disposition: A | Discharge status: Home / Self Care | Payer: 59 | Source: Ambulatory Visit | Attending: Internal Medicine | Admitting: Internal Medicine

## 2016-09-24 DIAGNOSIS — Z1231 Encounter for screening mammogram for malignant neoplasm of breast: Secondary | ICD-10-CM | POA: Diagnosis present

## 2016-09-28 DIAGNOSIS — H401222 Low-tension glaucoma, left eye, moderate stage: Secondary | ICD-10-CM | POA: Insufficient documentation

## 2016-09-28 DIAGNOSIS — H401213 Low-tension glaucoma, right eye, severe stage: Secondary | ICD-10-CM | POA: Insufficient documentation

## 2016-10-10 IMAGING — US US SOFT TISSUE HEAD/NECK
1 series · 14 of 23 positions shown · non-contrast
Comparison: No prior duplex

CLINICAL DATA: 59-year-old female with a history of goiter.

EXAM:
THYROID ULTRASOUND
TECHNIQUE: Ultrasound examination of the thyroid gland and adjacent soft
tissues was performed.

[Series 1: us soft tissue head/neck · 0.05mm/px · 14 of 23 slices shown]
[im 1/23]
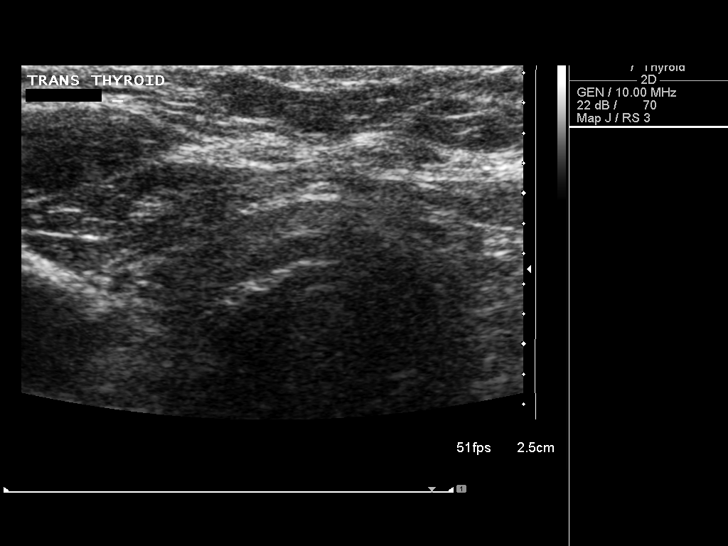
[im 3/23]
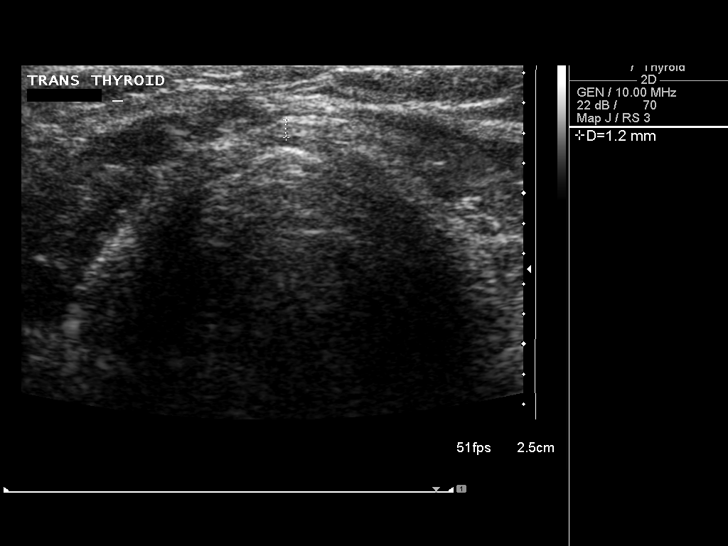
[im 5/23]
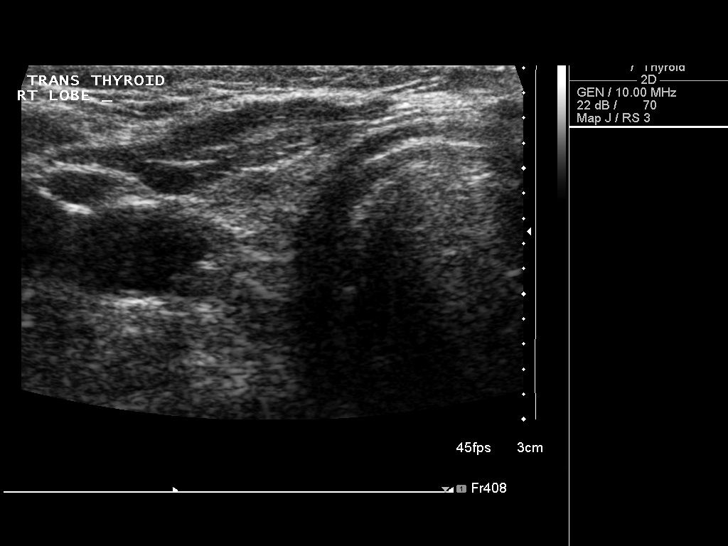
[im 6/23]
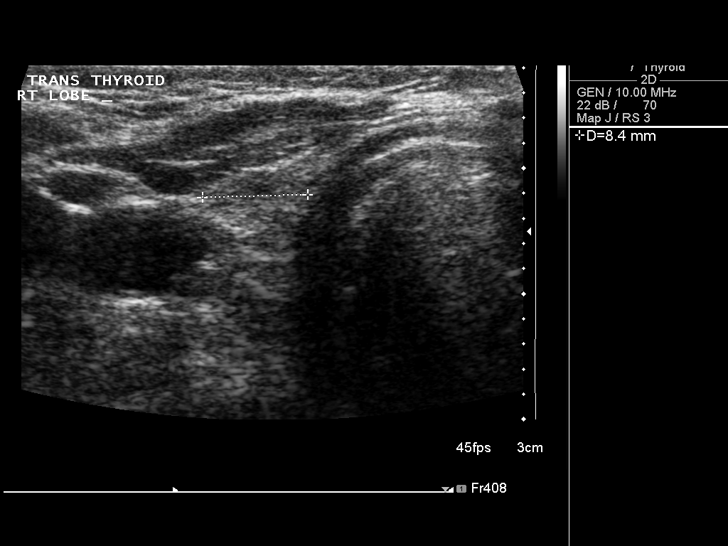
[im 8/23]
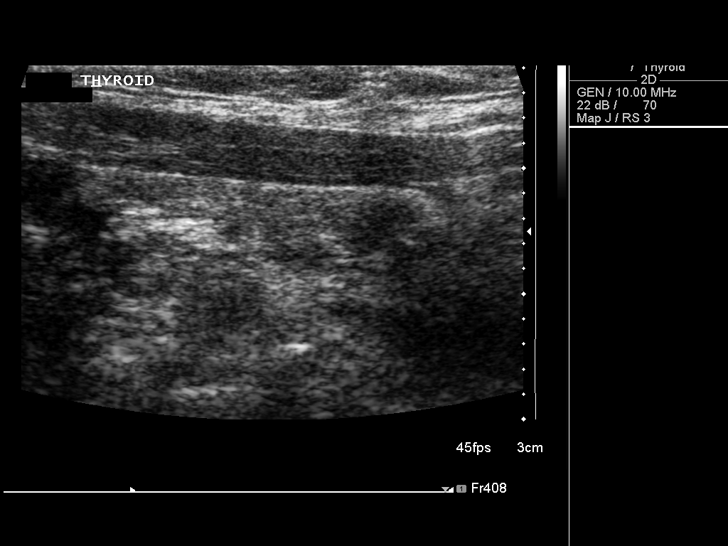
[im 10/23]
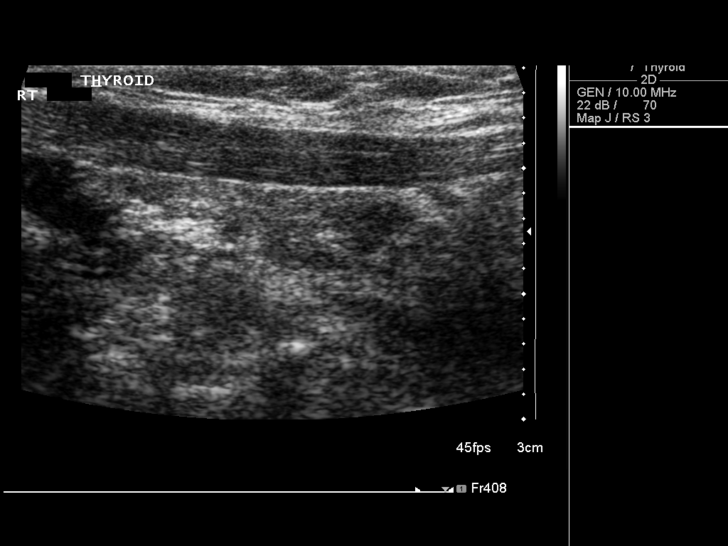
[im 11/23]
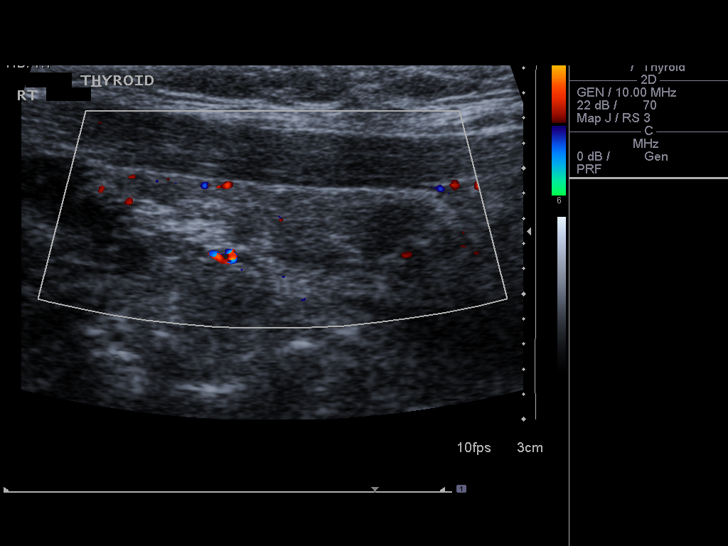
[im 13/23]
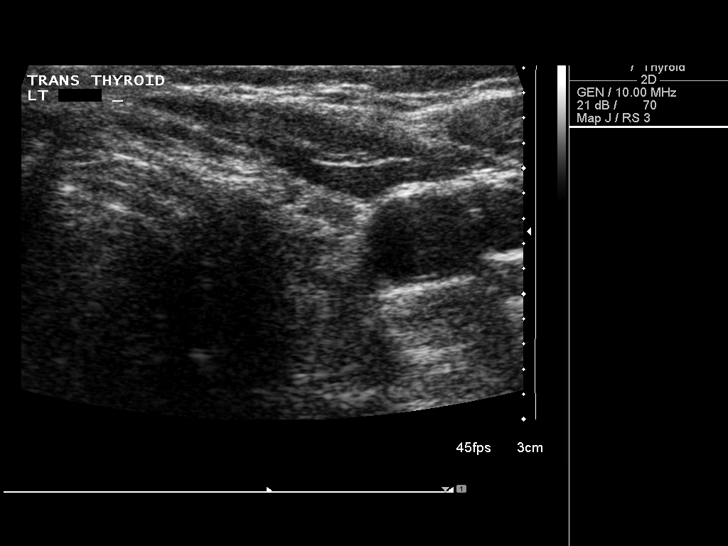
[im 14/23]
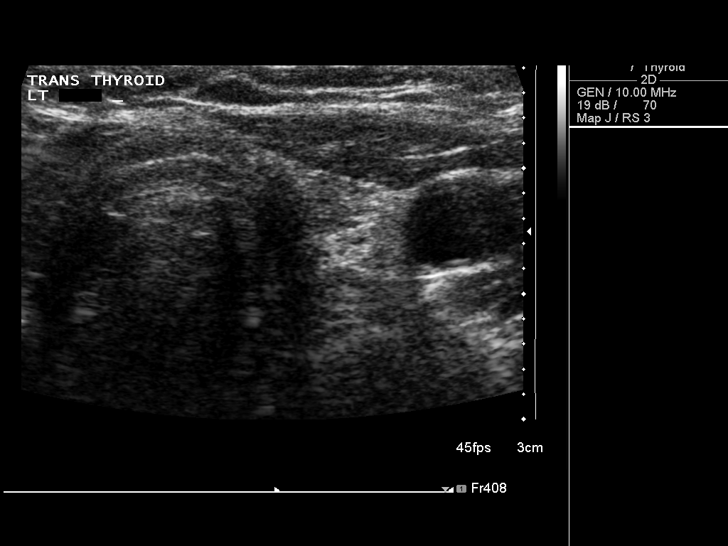
[im 16/23]
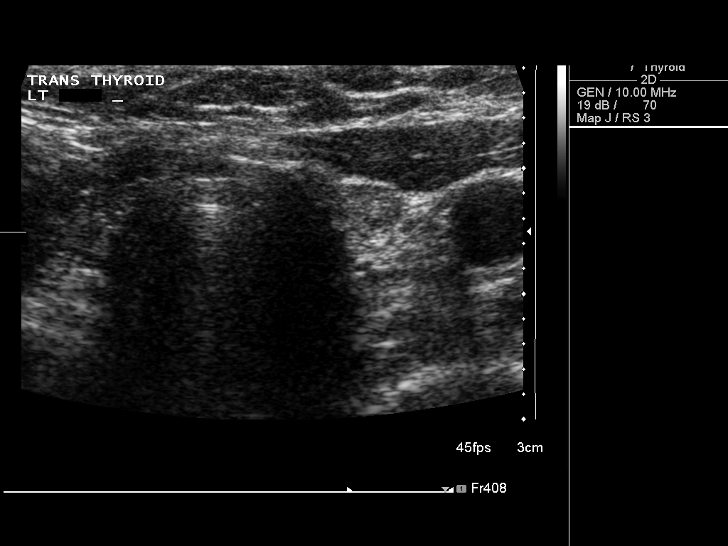
[im 18/23]
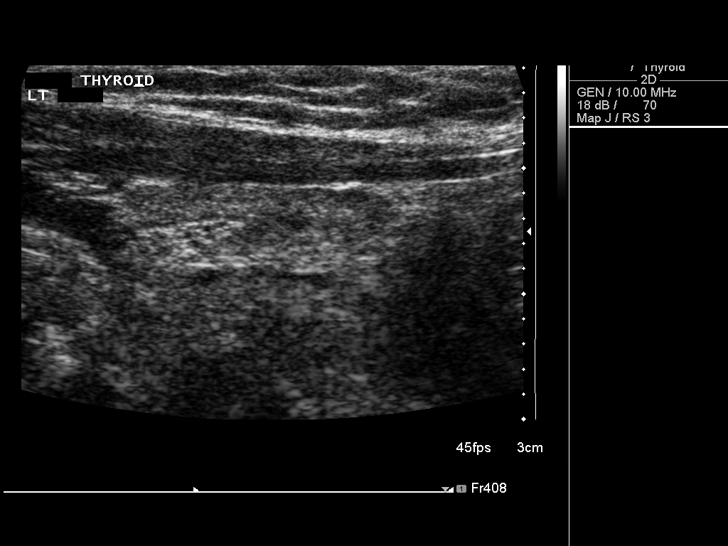
[im 19/23]
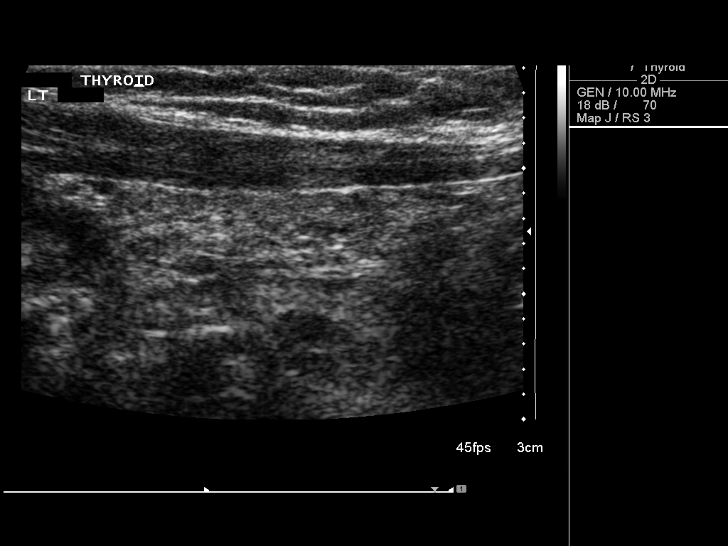
[im 21/23]
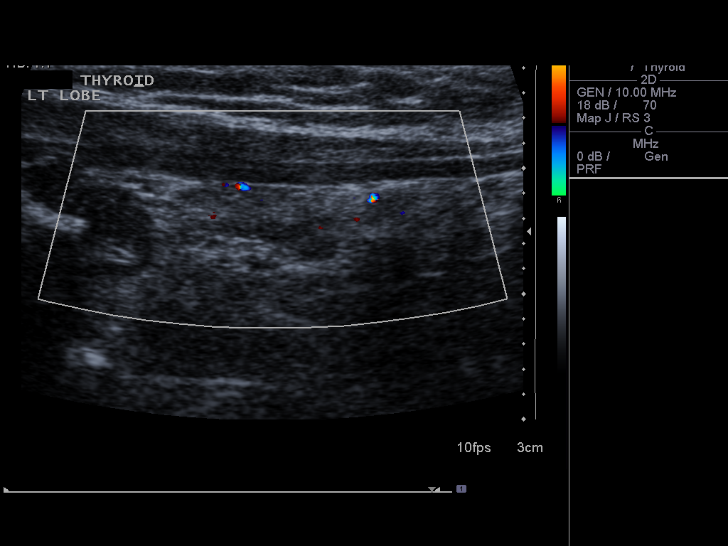
[im 23/23]
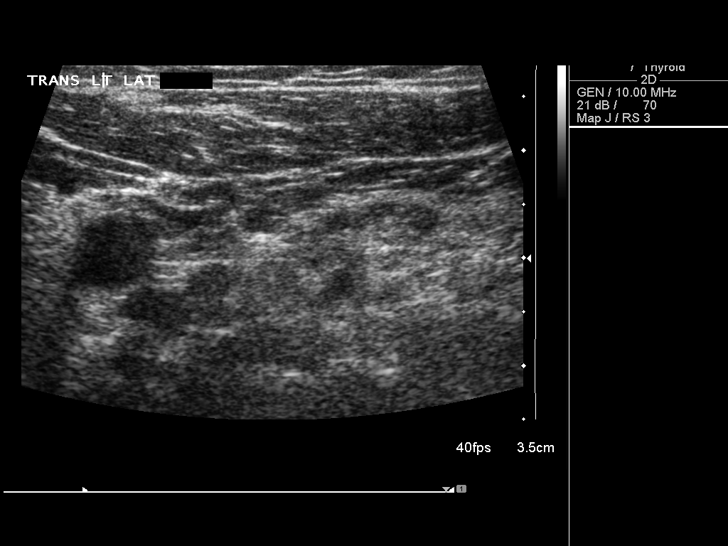

[14 of 23 positions shown; findings below may reference images not displayed]

FINDINGS: Right thyroid lobe

Measurements: 2.7 cm x 0.7 cm x 0.8 cm. Heterogeneous in atrophic
right-sided thyroid tissue.

Left thyroid lobe

Measurements: 2.3 cm x 0.5 cm x 0.6 cm. Heterogeneous and atrophic
left-sided thyroid tissue.

Isthmus

Thickness: 0.1 cm.  No nodules visualized.

Lymphadenopathy

None visualized.
IMPRESSION: Heterogeneous and atrophic thyroid tissue, compatible with the given
history of prior I 131 therapy.

## 2016-11-09 ENCOUNTER — Other Ambulatory Visit: Payer: Self-pay

## 2016-11-09 MED ORDER — SERTRALINE HCL 100 MG PO TABS: 100.0000 mg | ORAL_TABLET | Freq: Every day | ORAL | 0 refills | Status: DC

## 2016-11-16 ENCOUNTER — Encounter: Payer: Self-pay | Admitting: Obstetrics and Gynecology

## 2016-11-16 ENCOUNTER — Ambulatory Visit (INDEPENDENT_AMBULATORY_CARE_PROVIDER_SITE_OTHER): Payer: 59 | Admitting: Obstetrics and Gynecology

## 2016-11-16 ENCOUNTER — Other Ambulatory Visit (HOSPITAL_COMMUNITY)
Admission: RE | Admit: 2016-11-16 | Discharge: 2016-11-16 | Disposition: A | Discharge status: Home / Self Care | Payer: 59 | Source: Ambulatory Visit | Attending: Obstetrics and Gynecology | Admitting: Obstetrics and Gynecology

## 2016-11-16 VITALS — BP 118/74 | HR 84 | Resp 18 | Ht 65.5 in | Wt 295.0 lb

## 2016-11-16 DIAGNOSIS — N8185 Cervical stump prolapse: Secondary | ICD-10-CM

## 2016-11-16 DIAGNOSIS — Z124 Encounter for screening for malignant neoplasm of cervix: Secondary | ICD-10-CM

## 2016-11-16 DIAGNOSIS — Z01419 Encounter for gynecological examination (general) (routine) without abnormal findings: Secondary | ICD-10-CM

## 2016-11-16 DIAGNOSIS — N3946 Mixed incontinence: Secondary | ICD-10-CM | POA: Diagnosis not present

## 2016-11-16 DIAGNOSIS — R32 Unspecified urinary incontinence: Secondary | ICD-10-CM | POA: Diagnosis not present

## 2016-11-16 MED ORDER — OXYBUTYNIN CHLORIDE ER 5 MG PO TB24: 5.0000 mg | ORAL_TABLET | Freq: Every day | ORAL | 1 refills | Status: DC

## 2016-11-16 NOTE — Addendum Note (Signed)
Addended by: Dorothy Spark on: 11/16/2016 06:33 PM   Modules accepted: Orders

## 2016-11-16 NOTE — Progress Notes (Addendum)
61 y.o. G0P0000 SingleAfrican AmericanF here for annual exam.  H/O hysterectomy, no vaginal bleeding. Not sexually active.  She c/o a vaginal odor last year, currently gone.  She has mixed incontinence. Can leak a small amount with a cough. When she gets up at night she saturates a pad during the night (while sleeping), she can completely empty her bladder when she is trying to get to the bathroom. During the day she is okay, if she has to go to the bathroom she has to go or she will leak. If she leaks during the day it is small amounts. Doesn't need a pad during the day.  She drinks 1 cup of coffee a day, sometimes has sodas. She goes to the bathroom 3-4 x during the day    No LMP recorded. Patient has had a hysterectomy.          Sexually active: No.  The current method of family planning is status post hysterectomy.    Exercising: No.  The patient does not participate in regular exercise at present. Smoker:  no  Health Maintenance: Pap:  07-11-14 WNL NEG HR HPV History of abnormal Pap:  no MMG:  09-24-16 WNL  Colonoscopy:  07-12-15 polyps repeat in 5 year BMD:   Never TDaP:  12-04-14  Gardasil: NA   reports that she has never smoked. She has never used smokeless tobacco. She reports that she does not drink alcohol or use drugs. Works for Audiological scientist for [employer]  Past Medical History:  Diagnosis Date  . Allergic rhinitis   . Allergy   . Arthritis   . Cataract    bilateral removed  . Depression   . GERD (gastroesophageal reflux disease)   . Glaucoma   . Hypertension   . Hypothyroidism   . Morbid obesity ([place])   . OSA (obstructive sleep apnea)    Dr Joya Gaskins  . Sleep apnea    wears cpap  . Thyroid disease   . Urinary incontinence     Past Surgical History:  Procedure Laterality Date  . ANKLE SURGERY  4/10   rt  . BREAST REDUCTION SURGERY    . CATARACT EXTRACTION    . COLONOSCOPY    . HERNIA REPAIR  8341   umbillical  . INSERTION OF MESH N/A 01/07/2016   Procedure: INSERTION OF  MESH;  Surgeon: Coralie Keens, MD;  Location: [facility];  Service: General;  Laterality: N/A;  . REDUCTION MAMMAPLASTY    . VAGINAL HYSTERECTOMY    . VENTRAL HERNIA REPAIR N/A 01/07/2016   Procedure: VENTRAL HERNIA REPAIR;  Surgeon: Coralie Keens, MD;  Location: [facility];  Service: General;  Laterality: N/A;    Current Outpatient Prescriptions  Medication Sig Dispense Refill  . ALPHAGAN P 0.1 % SOLN Place 1 drop into both eyes 2 (two) times daily.  0  . amLODipine (NORVASC) 5 MG tablet TAKE ONE TABLET BY MOUTH DAILY 90 tablet 2  . doxazosin (CARDURA) 4 MG tablet TAKE ONE TABLET BY MOUTH DAILY 90 tablet 2  . ibuprofen (ADVIL,MOTRIN) 600 MG tablet Take 1 tablet (600 mg total) by mouth every 8 (eight) hours as needed. (Patient taking differently: Take 600 mg by mouth every 8 (eight) hours as needed for moderate pain. ) 90 tablet 1  . levothyroxine (SYNTHROID, LEVOTHROID) 175 MCG tablet TAKE ONE TABLET BY MOUTH DAILY 90 tablet 2  . sertraline (ZOLOFT) 100 MG tablet Take 1 tablet (100 mg total) by mouth daily. Must keep appt on 12/04/16 to get  refiils 90 tablet 0  . vitamin B-12 (CYANOCOBALAMIN) 500 MCG tablet Take 500 mcg by mouth daily.     No current facility-administered medications for this visit.     Family History  Problem Relation Age of Onset  . Hypertension Unknown   . Breast cancer Mother   . Cancer Mother        breast  . Colon cancer Maternal Grandmother   . Cancer Maternal Grandmother        colon  . Colon polyps Neg Hx   . Esophageal cancer Neg Hx   . Rectal cancer Neg Hx   . Stomach cancer Neg Hx     Review of Systems  Constitutional: Negative.   HENT: Negative.   Eyes: Negative.   Respiratory: Negative.   Cardiovascular: Negative.   Gastrointestinal: Negative.   Endocrine: Negative.   Genitourinary: Negative.   Musculoskeletal: Negative.   Skin: Negative.   Allergic/Immunologic: Negative.   Neurological: Negative.   Psychiatric/Behavioral: Negative.      Exam:   BP 118/74 (BP Location: Right Arm, Patient Position: Sitting, Cuff Size: Large)   Pulse 84   Resp 18   Ht 5' 5.5" (1.664 m)   Wt 295 lb (133.8 kg)   BMI 48.34 kg/m   Weight change: @WEIGHTCHANGE @ Height:   Height: 5' 5.5" (166.4 cm)  Ht Readings from Last 3 Encounters:  11/16/16 5' 5.5" (1.664 m)  01/07/16 5\' 6"  (1.676 m)  12/25/15 5\' 6"  (1.676 m)    General appearance: alert, cooperative and appears stated age Head: Normocephalic, without obvious abnormality, atraumatic Neck: no adenopathy, supple, symmetrical, trachea midline and thyroid normal to inspection and palpation Lungs: clear to auscultation bilaterally Cardiovascular: regular rate and rhythm Breasts: normal appearance, no masses or tenderness Abdomen: soft, non-tender; bowel sounds normal; no masses,  no organomegaly Extremities: extremities normal, atraumatic, no cyanosis or edema Skin: Skin color, texture, turgor normal. No rashes or lesions Lymph nodes: Cervical, supraclavicular, and axillary nodes normal. No abnormal inguinal nodes palpated Neurologic: Grossly normal   Pelvic: External genitalia:  no lesions              Urethra:  normal appearing urethra with no masses, tenderness or lesions              Bartholins and Skenes: normal                 Vagina: normal appearing vagina with normal color and discharge, no lesions              Cervix: no lesions and prolapsed to the introitus. Examined supine and standing, with and without valsalva. No other significant prolase.                Bimanual Exam:  Uterus:  uterus absent              Adnexa: no mass, fullness, tenderness               Rectovaginal: Confirms               Anus:  normal sphincter tone, no lesions  Chaperone was present for exam.  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 Would avoid heavy lifting and straining. No treatment needed currently  Discussed the options of PT, trial of oxybutynin and referral to urology, she will let me know if she is interested in any of those options  After further discussion she would like to try medication  Start oxybutynin, side effects reviewed.   F/U in one month  The patient would like a call to discuss the type of hysterectomy she had once I review the op note.   11/28/16 addendum: Pathology report from hysterectomy reviewed: 1016 gram uterus, supracervical, BSO, all benign.  Op note reviewed: on 02/01/06 The patient underwent an abdominal supracervical hysterectomy with BSO. She had a vertical midline incision, large fibroid uterus.

## 2016-11-16 NOTE — Patient Instructions (Signed)
Kegel Exercises Kegel exercises help strengthen the muscles that support the rectum, vagina, small intestine, bladder, and uterus. Doing Kegel exercises can help:  Improve bladder and bowel control.  Improve sexual response.  Reduce problems and discomfort during pregnancy.  Kegel exercises involve squeezing your pelvic floor muscles, which are the same muscles you squeeze when you try to stop the flow of urine. The exercises can be done while sitting, standing, or lying down, but it is best to vary your position. Phase 1 exercises 1. Squeeze your pelvic floor muscles tight. You should feel a tight lift in your rectal area. If you are a female, you should also feel a tightness in your vaginal area. Keep your stomach, buttocks, and legs relaxed. 2. Hold the muscles tight for up to 10 seconds. 3. Relax your muscles. Repeat this exercise 50 times a day or as many times as told by your health care provider. Continue to do this exercise for at least 4-6 weeks or for as long as told by your health care provider. This information is not intended to replace advice given to you by your health care provider. Make sure you discuss any questions you have with your health care provider. Document Released: 04/20/2012 Document Revised: 12/28/2015 Document Reviewed: 03/24/2015 Elsevier Interactive Patient Education  2018 Elsevier Inc.  EXERCISE AND DIET:  We recommended that you start or continue a regular exercise program for good health. Regular exercise means any activity that makes your heart beat faster and makes you sweat.  We recommend exercising at least 30 minutes per day at least 3 days a week, preferably 4 or 5.  We also recommend a diet low in fat and sugar.  Inactivity, poor dietary choices and obesity can cause diabetes, heart attack, stroke, and kidney damage, among others.    ALCOHOL AND SMOKING:  Women should limit their alcohol intake to no more than 7 drinks/beers/glasses of wine (combined,  not each!) per week. Moderation of alcohol intake to this level decreases your risk of breast cancer and liver damage. And of course, no recreational drugs are part of a healthy lifestyle.  And absolutely no smoking or even second hand smoke. Most people know smoking can cause heart and lung diseases, but did you know it also contributes to weakening of your bones? Aging of your skin?  Yellowing of your teeth and nails?  CALCIUM AND VITAMIN D:  Adequate intake of calcium and Vitamin D are recommended.  The recommendations for exact amounts of these supplements seem to change often, but generally speaking 600 mg of calcium (either carbonate or citrate) and 800 units of Vitamin D per day seems prudent. Certain women may benefit from higher intake of Vitamin D.  If you are among these women, your doctor will have told you during your visit.    PAP SMEARS:  Pap smears, to check for cervical cancer or precancers,  have traditionally been done yearly, although recent scientific advances have shown that most women can have pap smears less often.  However, every woman still should have a physical exam from her gynecologist every year. It will include a breast check, inspection of the vulva and vagina to check for abnormal growths or skin changes, a visual exam of the cervix, and then an exam to evaluate the size and shape of the uterus and ovaries.  And after 62 years of age, a rectal exam is indicated to check for rectal cancers. We will also provide age appropriate advice regarding health maintenance,   like when you should have certain vaccines, screening for sexually transmitted diseases, bone density testing, colonoscopy, mammograms, etc.   MAMMOGRAMS:  All women over 40 years old should have a yearly mammogram. Many facilities now offer a "3D" mammogram, which may cost around $50 extra out of pocket. If possible,  we recommend you accept the option to have the 3D mammogram performed.  It both reduces the number of  women who will be called back for extra views which then turn out to be normal, and it is better than the routine mammogram at detecting truly abnormal areas.    COLONOSCOPY:  Colonoscopy to screen for colon cancer is recommended for all women at age 50.  We know, you hate the idea of the prep.  We agree, BUT, having colon cancer and not knowing it is worse!!  Colon cancer so often starts as a polyp that can be seen and removed at colonscopy, which can quite literally save your life!  And if your first colonoscopy is normal and you have no family history of colon cancer, most women don't have to have it again for 10 years.  Once every ten years, you can do something that may end up saving your life, right?  We will be happy to help you get it scheduled when you are ready.  Be sure to check your insurance coverage so you understand how much it will cost.  It may be covered as a preventative service at no cost, but you should check your particular policy.      

## 2016-11-17 LAB — URINALYSIS, MICROSCOPIC ONLY: Casts: NONE SEEN /lpf

## 2016-11-17 LAB — URINE CULTURE

## 2016-11-19 LAB — CYTOLOGY - PAP
Diagnosis: NEGATIVE
HPV: NOT DETECTED

## 2016-11-20 ENCOUNTER — Telehealth: Payer: Self-pay

## 2016-11-20 NOTE — Telephone Encounter (Signed)
-----   Message from Nunzio Cobbs, MD sent at 11/19/2016  5:26 PM EDT ----- Please report urine culture showing no bladder infection.  Her pap was normal and her HR HPV was negative.   Recall - 02.

## 2016-11-20 NOTE — Telephone Encounter (Signed)
Returning a call to Joy. °

## 2016-11-20 NOTE — Telephone Encounter (Signed)
No voicemail. Try again. 

## 2016-11-20 NOTE — Telephone Encounter (Signed)
Patient notified of results. See lab 

## 2016-11-28 ENCOUNTER — Encounter: Payer: Self-pay | Admitting: Obstetrics and Gynecology

## 2016-12-03 ENCOUNTER — Telehealth: Payer: Self-pay

## 2016-12-03 NOTE — Telephone Encounter (Signed)
-----   Message from Salvadore Dom, MD sent at 11/28/2016  1:20 PM EDT ----- Can you please call the patient and let her know that I have received and reviewed her pathology report. She didn't remember what happened and requested a call with the information.  She had an abdominal, supracervical hysterectomy with removal of both her tubes and ovaries. This was done through a vertical (up and down) incision in her lower abdomen. As we discovered at the time of her exam, she still has her cervix. She had a large fibroid uterus. The pathology was all benign.  Thanks, Sharee Pimple

## 2016-12-03 NOTE — Telephone Encounter (Signed)
Spoke with patient. Advised of message as seen below from [place]. Patient verbalizes understanding. States she has been taking Oxybutynin 5 mg daily and is still having urinary incontinence. "I am not able to get to the bathroom. I just cannot hold it." Advised will review with Dr.Jertson and return call with recommendations.

## 2016-12-04 ENCOUNTER — Ambulatory Visit (INDEPENDENT_AMBULATORY_CARE_PROVIDER_SITE_OTHER): Payer: 59 | Admitting: Internal Medicine

## 2016-12-04 ENCOUNTER — Other Ambulatory Visit (INDEPENDENT_AMBULATORY_CARE_PROVIDER_SITE_OTHER): Payer: 59

## 2016-12-04 ENCOUNTER — Telehealth: Payer: Self-pay | Admitting: Internal Medicine

## 2016-12-04 ENCOUNTER — Encounter: Payer: Self-pay | Admitting: Internal Medicine

## 2016-12-04 VITALS — BP 146/88 | HR 77 | Temp 97.9°F | Ht 65.5 in | Wt 297.0 lb

## 2016-12-04 DIAGNOSIS — Z0001 Encounter for general adult medical examination with abnormal findings: Secondary | ICD-10-CM | POA: Diagnosis not present

## 2016-12-04 DIAGNOSIS — R7309 Other abnormal glucose: Secondary | ICD-10-CM

## 2016-12-04 DIAGNOSIS — R7989 Other specified abnormal findings of blood chemistry: Secondary | ICD-10-CM

## 2016-12-04 DIAGNOSIS — H1131 Conjunctival hemorrhage, right eye: Secondary | ICD-10-CM | POA: Insufficient documentation

## 2016-12-04 DIAGNOSIS — E559 Vitamin D deficiency, unspecified: Secondary | ICD-10-CM | POA: Diagnosis not present

## 2016-12-04 DIAGNOSIS — R32 Unspecified urinary incontinence: Secondary | ICD-10-CM | POA: Diagnosis not present

## 2016-12-04 DIAGNOSIS — Z0000 Encounter for general adult medical examination without abnormal findings: Secondary | ICD-10-CM | POA: Diagnosis not present

## 2016-12-04 LAB — URINALYSIS
Bilirubin Urine: NEGATIVE
Ketones, ur: NEGATIVE
Leukocytes, UA: NEGATIVE
Nitrite: NEGATIVE
Specific Gravity, Urine: >=1.03 — AB (ref 1.000–1.030)
Total Protein, Urine: NEGATIVE
Urine Glucose: NEGATIVE
Urobilinogen, UA: 0.2 (ref 0.0–1.0)
pH: 5.5 (ref 5.0–8.0)

## 2016-12-04 LAB — BASIC METABOLIC PANEL
BUN: 14 mg/dL (ref 6–23)
CO2: 29 mEq/L (ref 19–32)
Calcium: 9.4 mg/dL (ref 8.4–10.5)
Chloride: 108 mEq/L (ref 96–112)
Creatinine, Ser: 0.76 mg/dL (ref 0.40–1.20)
GFR: 99.2 mL/min (ref >60.00)
Glucose, Bld: 103 mg/dL — ABNORMAL HIGH (ref 70–99)
Potassium: 4.2 mEq/L (ref 3.5–5.1)
Sodium: 142 mEq/L (ref 135–145)

## 2016-12-04 LAB — CBC WITH DIFFERENTIAL/PLATELET
Basophils Absolute: 0 10*3/uL (ref 0.0–0.1)
Basophils Relative: 0.5 % (ref 0.0–3.0)
Eosinophils Absolute: 0.2 10*3/uL (ref 0.0–0.7)
Eosinophils Relative: 3.8 % (ref 0.0–5.0)
HCT: 38.9 % (ref 36.0–46.0)
Hemoglobin: 12.5 g/dL (ref 12.0–15.0)
Lymphocytes Relative: 33.5 % (ref 12.0–46.0)
Lymphs Abs: 2 10*3/uL (ref 0.7–4.0)
MCHC: 32.1 g/dL (ref 30.0–36.0)
MCV: 92.4 fl (ref 78.0–100.0)
Monocytes Absolute: 0.4 10*3/uL (ref 0.1–1.0)
Monocytes Relative: 6.3 % (ref 3.0–12.0)
Neutro Abs: 3.3 10*3/uL (ref 1.4–7.7)
Neutrophils Relative %: 55.9 % (ref 43.0–77.0)
Platelets: 258 10*3/uL (ref 150.0–400.0)
RBC: 4.21 Mil/uL (ref 3.87–5.11)
RDW: 13.8 % (ref 11.5–15.5)
WBC: 6 10*3/uL (ref 4.0–10.5)

## 2016-12-04 LAB — LIPID PANEL
Cholesterol: 200 mg/dL (ref 0–200)
HDL: 45.2 mg/dL (ref >39.00)
LDL Cholesterol: 137 mg/dL — ABNORMAL HIGH (ref 0–99)
NonHDL: 154.45
Total CHOL/HDL Ratio: 4
Triglycerides: 88 mg/dL (ref 0.0–149.0)
VLDL: 17.6 mg/dL (ref 0.0–40.0)

## 2016-12-04 LAB — TSH: TSH: 0.11 u[IU]/mL — ABNORMAL LOW (ref 0.35–4.50)

## 2016-12-04 LAB — HEPATIC FUNCTION PANEL
ALT: 18 U/L (ref 0–35)
AST: 14 U/L (ref 0–37)
Albumin: 4 g/dL (ref 3.5–5.2)
Alkaline Phosphatase: 95 U/L (ref 39–117)
Bilirubin, Direct: 0.1 mg/dL (ref 0.0–0.3)
Total Bilirubin: 0.4 mg/dL (ref 0.2–1.2)
Total Protein: 7.2 g/dL (ref 6.0–8.3)

## 2016-12-04 LAB — HEMOGLOBIN A1C: Hgb A1c MFr Bld: 5.5 % (ref 4.6–6.5)

## 2016-12-04 MED ORDER — OXYBUTYNIN CHLORIDE ER 10 MG PO TB24: 10.0000 mg | ORAL_TABLET | Freq: Every day | ORAL | 11 refills | Status: DC

## 2016-12-04 MED ORDER — LEVOTHYROXINE SODIUM 175 MCG PO TABS: 175.0000 ug | ORAL_TABLET | Freq: Every day | ORAL | 2 refills | Status: DC

## 2016-12-04 MED ORDER — SERTRALINE HCL 100 MG PO TABS: 100.0000 mg | ORAL_TABLET | Freq: Every day | ORAL | 2 refills | Status: DC

## 2016-12-04 MED ORDER — OXYBUTYNIN CHLORIDE ER 10 MG PO TB24: 10.0000 mg | ORAL_TABLET | Freq: Every day | ORAL | 0 refills | Status: DC

## 2016-12-04 MED ORDER — AMLODIPINE BESYLATE 5 MG PO TABS: 5.0000 mg | ORAL_TABLET | Freq: Every day | ORAL | 2 refills | Status: DC

## 2016-12-04 MED ORDER — DOXAZOSIN MESYLATE 4 MG PO TABS: 4.0000 mg | ORAL_TABLET | Freq: Every day | ORAL | 2 refills | Status: DC

## 2016-12-04 NOTE — Assessment & Plan Note (Signed)
On vit d 

## 2016-12-04 NOTE — Assessment & Plan Note (Signed)
Oxybutynin ER Hold Doxazosin for 1 week to see if it helped your bladder problem

## 2016-12-04 NOTE — Telephone Encounter (Signed)
LM notifying pt

## 2016-12-04 NOTE — Progress Notes (Signed)
Subjective:  Patient ID: Tracy Sampson, female    DOB: Aug 27, 1955  Age: 62 y.o. MRN: [id number]  CC: No chief complaint on file.   HPI Tracy Sampson presents for a well exam C/o R eye redness    Outpatient Medications Prior to Visit  Medication Sig Dispense Refill  . ALPHAGAN P 0.1 % SOLN Place 1 drop into both eyes 2 (two) times daily.  0  . amLODipine (NORVASC) 5 MG tablet TAKE ONE TABLET BY MOUTH DAILY 90 tablet 2  . doxazosin (CARDURA) 4 MG tablet TAKE ONE TABLET BY MOUTH DAILY 90 tablet 2  . ibuprofen (ADVIL,MOTRIN) 600 MG tablet Take 1 tablet (600 mg total) by mouth every 8 (eight) hours as needed. (Patient taking differently: Take 600 mg by mouth every 8 (eight) hours as needed for moderate pain. ) 90 tablet 1  . levothyroxine (SYNTHROID, LEVOTHROID) 175 MCG tablet TAKE ONE TABLET BY MOUTH DAILY 90 tablet 2  . oxybutynin (DITROPAN XL) 10 MG 24 hr tablet Take 1 tablet (10 mg total) by mouth at bedtime. 30 tablet 0  . sertraline (ZOLOFT) 100 MG tablet Take 1 tablet (100 mg total) by mouth daily. Must keep appt on 12/04/16 to get refiils 90 tablet 0  . vitamin B-12 (CYANOCOBALAMIN) 500 MCG tablet Take 500 mcg by mouth daily.     No facility-administered medications prior to visit.     ROS Review of Systems  Constitutional: Negative for activity change, appetite change, chills, fatigue and unexpected weight change.  HENT: Negative for congestion, mouth sores and sinus pressure.   Eyes: Negative for visual disturbance.  Respiratory: Negative for cough and chest tightness.   Gastrointestinal: Negative for abdominal pain and nausea.  Genitourinary: Positive for enuresis, frequency and urgency. Negative for difficulty urinating and vaginal pain.  Musculoskeletal: Negative for back pain and gait problem.  Skin: Negative for pallor and rash.  Neurological: Negative for dizziness, tremors, weakness, numbness and headaches.  Psychiatric/Behavioral: Negative for confusion and  sleep disturbance.    Objective:  BP (!) 146/88 (BP Location: Left Arm, Patient Position: Sitting, Cuff Size: Large)   Pulse 77   Temp 97.9 F (36.6 C) (Oral)   Ht 5' 5.5" (1.664 m)   Wt 297 lb (134.7 kg)   SpO2 97%   BMI 48.67 kg/m   BP Readings from Last 3 Encounters:  12/04/16 (!) 146/88  11/16/16 118/74  01/29/16 139/89    Wt Readings from Last 3 Encounters:  12/04/16 297 lb (134.7 kg)  11/16/16 295 lb (133.8 kg)  01/29/16 299 lb (135.6 kg)    Physical Exam  Constitutional: She appears well-developed. No distress.  HENT:  Head: Normocephalic.  Right Ear: External ear normal.  Left Ear: External ear normal.  Nose: Nose normal.  Mouth/Throat: Oropharynx is clear and moist.  Eyes: Pupils are equal, round, and reactive to light. Conjunctivae are normal. Right eye exhibits no discharge. Left eye exhibits no discharge.  Neck: Normal range of motion. Neck supple. No JVD present. No tracheal deviation present. No thyromegaly present.  Cardiovascular: Normal rate, regular rhythm and normal heart sounds.   Pulmonary/Chest: No stridor. No respiratory distress. She has no wheezes.  Abdominal: Soft. Bowel sounds are normal. She exhibits no distension and no mass. There is no tenderness. There is no rebound and no guarding.  Musculoskeletal: She exhibits no edema or tenderness.  Lymphadenopathy:    She has no cervical adenopathy.  Neurological: She displays normal reflexes. No cranial nerve deficit. She  exhibits normal muscle tone. Coordination normal.  Skin: No rash noted. No erythema.  Psychiatric: She has a normal mood and affect. Her behavior is normal. Judgment and thought content normal.  R eye w/subconj hemorrhage - small Obese   Lab Results  Component Value Date   WBC 4.9 12/25/2015   HGB 12.0 12/25/2015   HCT 39.4 12/25/2015   PLT 258 12/25/2015   GLUCOSE 108 (H) 12/25/2015   CHOL 202 (H) 06/06/2015   TRIG 78.0 06/06/2015   HDL 43.50 06/06/2015   LDLDIRECT  149.3 10/23/2010   LDLCALC 143 (H) 06/06/2015   ALT 17 06/06/2015   AST 17 06/06/2015   NA 144 12/25/2015   K 4.0 12/25/2015   CL 109 12/25/2015   CREATININE 0.75 12/25/2015   BUN 10 12/25/2015   CO2 28 12/25/2015   TSH 1.42 06/06/2015   HGBA1C 5.5 06/06/2015    No results found.  Assessment & Plan:   There are no diagnoses linked to this encounter. I am having Ms. Palladino maintain her vitamin B-12, ALPHAGAN P, ibuprofen, levothyroxine, doxazosin, amLODipine, sertraline, and oxybutynin.  No orders of the defined types were placed in this encounter.    Follow-up: No Follow-up on file.  Walker Kehr, MD

## 2016-12-04 NOTE — Telephone Encounter (Signed)
Try Sertaline 1/2 tab Thx

## 2016-12-04 NOTE — Telephone Encounter (Signed)
Spoke with patient. Advised of message as seen below. Patient is agreeable. Rx for Oxybutynin 10 mg daily #30 0RF sent to pharmacy on file. Patient is scheduled for follow up on 12/17/2016.  Routing to provider for final review. Patient agreeable to disposition. Will close encounter.

## 2016-12-04 NOTE — Patient Instructions (Addendum)
Hold Doxazosin for 1 week to see if it helped your bladder problem     Subconjunctival Hemorrhage Subconjunctival hemorrhage is bleeding that happens between the white part of your eye (sclera) and the clear membrane that covers the outside of your eye (conjunctiva). There are many tiny blood vessels near the surface of your eye. A subconjunctival hemorrhage happens when one or more of these vessels breaks and bleeds, causing a red patch to appear on your eye. This is similar to a bruise. Depending on the amount of bleeding, the red patch may only cover a small area of your eye or it may cover the entire visible part of the sclera. If a lot of blood collects under the conjunctiva, there may also be swelling. Subconjunctival hemorrhages do not affect your vision or cause pain, but your eye may feel irritated if there is swelling. Subconjunctival hemorrhages usually do not require treatment, and they disappear on their own within two weeks. What are the causes? This condition may be caused by:  Mild trauma, such as rubbing your eye too hard.  Severe trauma or blunt injuries.  Coughing, sneezing, or vomiting.  Straining, such as when lifting a heavy object.  High blood pressure.  Recent eye surgery.  A history of diabetes.  Certain medicines, especially blood thinners (anticoagulants).  Other conditions, such as eye tumors, bleeding disorders, or blood vessel abnormalities.  Subconjunctival hemorrhages can happen without an obvious cause. What are the signs or symptoms? Symptoms of this condition include:  A bright red or dark red patch on the white part of the eye. ? The red area may spread out to cover a larger area of the eye before it goes away. ? The red area may turn brownish-yellow before it goes away.  Swelling.  Mild eye irritation.  How is this diagnosed? This condition is diagnosed with a physical exam. If your subconjunctival hemorrhage was caused by trauma, your  health care provider may refer you to an eye specialist (ophthalmologist) or another specialist to check for other injuries. You may have other tests, including:  An eye exam.  A blood pressure check.  Blood tests to check for bleeding disorders.  If your subconjunctival hemorrhage was caused by trauma, X-rays or a CT scan may be done to check for other injuries. How is this treated? Usually, no treatment is needed. Your health care provider may recommend eye drops or cold compresses to help with discomfort. Follow these instructions at home:  Take over-the-counter and prescription medicines only as directed by your health care provider.  Use eye drops or cold compresses to help with discomfort as directed by your health care provider.  Avoid activities, things, and environments that may irritate or injure your eye.  Keep all follow-up visits as told by your health care provider. This is important. Contact a health care provider if:  You have pain in your eye.  The bleeding does not go away within 3 weeks.  You keep getting new subconjunctival hemorrhages. Get help right away if:  Your vision changes or you have difficulty seeing.  You suddenly develop severe sensitivity to light.  You develop a severe headache, persistent vomiting, confusion, or abnormal tiredness (lethargy).  Your eye seems to bulge or protrude from your eye socket.  You develop unexplained bruises on your body.  You have unexplained bleeding in another area of your body. This information is not intended to replace advice given to you by your health care provider. Make sure you  discuss any questions you have with your health care provider. Document Released: 05/04/2005 Document Revised: 12/29/2015 Document Reviewed: 07/11/2014 Elsevier Interactive Patient Education  [employer].

## 2016-12-04 NOTE — Telephone Encounter (Signed)
Pt came back into the office after getting her labs done and stated she had a med question regarding her Sertraline 100 MG tab.  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  She is wondering if the dosage needs to be changed on the Sertraline or if she needs a med change altogether?

## 2016-12-04 NOTE — Assessment & Plan Note (Signed)
Wt Readings from Last 3 Encounters:  12/04/16 297 lb (134.7 kg)  11/16/16 295 lb (133.8 kg)  01/29/16 299 lb (135.6 kg)

## 2016-12-04 NOTE — Assessment & Plan Note (Signed)
We discussed age appropriate health related issues, including available/recomended screening tests and vaccinations. We discussed a need for adhering to healthy diet and exercise. Labs/EKG were reviewed/ordered. All questions were answered.   Pt declined shots Labs Colon 2017

## 2016-12-04 NOTE — Assessment & Plan Note (Signed)
Will watch 

## 2016-12-04 NOTE — Telephone Encounter (Signed)
Please increase the oxybutynin to the 10 mg a day extended release tablets. One month supply. She should have a f/u appointment. If not please schedule one for one month

## 2016-12-07 ENCOUNTER — Other Ambulatory Visit: Payer: Self-pay | Admitting: Internal Medicine

## 2016-12-07 MED ORDER — LEVOTHYROXINE SODIUM 150 MCG PO TABS: 150.0000 ug | ORAL_TABLET | Freq: Every day | ORAL | 5 refills | Status: DC

## 2016-12-10 ENCOUNTER — Telehealth: Payer: Self-pay | Admitting: Internal Medicine

## 2016-12-10 NOTE — Telephone Encounter (Signed)
Pt called regarding her Rx for levothyroxine (SYNTHROID, LEVOTHROID) 150 MCG tablet  She got upset because she still has 90 days of 175mg  left and she said it was a waste of money and I told her we want to make sure you get the correct dosage and she asked if she could just cut the tip off of her 175 mg tablet and I told her I do not advise that because you may not get the correct dosage Dr. Camila Li wants you to receive. She did not understand why she couldn't continue her 175mg  pill.  She was told she needs to follow up in 1 mo for her TSH.

## 2016-12-10 NOTE — Telephone Encounter (Signed)
Explained to pt why medication was changed and she understood.

## 2016-12-16 ENCOUNTER — Telehealth: Payer: Self-pay | Admitting: Obstetrics and Gynecology

## 2016-12-16 NOTE — Telephone Encounter (Signed)
Patient called and cancelled her 4 week recheck appointment with Dr. Talbert Nan on 12/17/16. She is requesting to speak with the nurse about whether she needs to reschedule or not due to some changes.

## 2016-12-16 NOTE — Telephone Encounter (Signed)
Call to patient, phone rang and rang, no answer, no voicemail, unable to leave message.

## 2016-12-17 ENCOUNTER — Ambulatory Visit: Payer: 59 | Admitting: Obstetrics and Gynecology

## 2017-01-06 NOTE — Telephone Encounter (Signed)
Left message to call Ginna Schuur at [phone].  

## 2017-01-07 NOTE — Telephone Encounter (Signed)
Left message to call Zissy Hamlett at [phone].  

## 2017-01-07 NOTE — Telephone Encounter (Signed)
Return call to [place] [facility].

## 2017-01-15 ENCOUNTER — Other Ambulatory Visit (INDEPENDENT_AMBULATORY_CARE_PROVIDER_SITE_OTHER): Payer: 59

## 2017-01-15 DIAGNOSIS — R946 Abnormal results of thyroid function studies: Secondary | ICD-10-CM

## 2017-01-15 DIAGNOSIS — R7989 Other specified abnormal findings of blood chemistry: Secondary | ICD-10-CM

## 2017-01-15 LAB — T4, FREE: Free T4: 1.11 ng/dL (ref 0.60–1.60)

## 2017-01-15 LAB — TSH: TSH: 0.33 u[IU]/mL — ABNORMAL LOW (ref 0.35–4.50)

## 2017-01-20 NOTE — Telephone Encounter (Signed)
Left message to call Kolin Erdahl at [phone].  

## 2017-01-21 ENCOUNTER — Telehealth: Payer: Self-pay | Admitting: Obstetrics and Gynecology

## 2017-01-21 NOTE — Telephone Encounter (Signed)
Left message to call [place] at [phone]. Please see telephone note dated 12/16/2016.

## 2017-01-21 NOTE — Telephone Encounter (Signed)
Patient returning a call from [place]

## 2017-01-26 NOTE — Telephone Encounter (Signed)
Left message to call [place] at [phone].  Patient cancelled her 4 week med recheck with Dr.Jertson. Is she taking Ditropan XL?

## 2017-02-02 NOTE — Telephone Encounter (Signed)
Will close encounter. She will call if she wants the [employer]

## 2017-02-02 NOTE — Telephone Encounter (Signed)
Dr.Jertson, I have attempted to reach this patient x 2 with no return call regarding rescheduling medication recheck appointment with no return call. Please advise.

## 2017-03-03 NOTE — Telephone Encounter (Signed)
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 Please send her a note telling her that if she is still taking the ditropan, or if her urinary incontinence is bothering her, she should come in for a f/u visit. Please cancel refills at her pharmacy.

## 2017-03-03 NOTE — Telephone Encounter (Signed)
Routing to Dr. Talbert Nan for review. Many attempts to contact patient to no avail. Okay to close encounter or is further follow up needed?

## 2017-03-03 NOTE — Telephone Encounter (Signed)
Then her primary must be following her. Don't send the letter. Will close the encounter.

## 2017-03-03 NOTE — Telephone Encounter (Signed)
Dr. Talbert Nan, per review of EPIC, patient was seen by PCP 12/04/16, oxybutynin prescribed by PCP on same day. Please advise?

## 2017-03-03 NOTE — Telephone Encounter (Signed)
Will close encounter

## 2017-03-09 ENCOUNTER — Other Ambulatory Visit: Payer: Self-pay

## 2017-03-09 MED ORDER — DOXAZOSIN MESYLATE 4 MG PO TABS: 4.0000 mg | ORAL_TABLET | Freq: Every day | ORAL | 2 refills | Status: DC

## 2017-05-06 DIAGNOSIS — H35352 Cystoid macular degeneration, left eye: Secondary | ICD-10-CM | POA: Insufficient documentation

## 2017-05-06 DIAGNOSIS — H34832 Tributary (branch) retinal vein occlusion, left eye, with macular edema: Secondary | ICD-10-CM | POA: Insufficient documentation

## 2017-06-04 ENCOUNTER — Other Ambulatory Visit: Payer: Self-pay | Admitting: Internal Medicine

## 2017-06-07 ENCOUNTER — Ambulatory Visit: Payer: Managed Care, Other (non HMO) | Admitting: Internal Medicine

## 2017-06-07 DIAGNOSIS — Z0289 Encounter for other administrative examinations: Secondary | ICD-10-CM

## 2017-07-07 ENCOUNTER — Ambulatory Visit (INDEPENDENT_AMBULATORY_CARE_PROVIDER_SITE_OTHER): Payer: Managed Care, Other (non HMO) | Admitting: Internal Medicine

## 2017-07-07 ENCOUNTER — Encounter: Payer: Self-pay | Admitting: Internal Medicine

## 2017-07-07 DIAGNOSIS — L230 Allergic contact dermatitis due to metals: Secondary | ICD-10-CM

## 2017-07-07 DIAGNOSIS — K219 Gastro-esophageal reflux disease without esophagitis: Secondary | ICD-10-CM | POA: Diagnosis not present

## 2017-07-07 DIAGNOSIS — L259 Unspecified contact dermatitis, unspecified cause: Secondary | ICD-10-CM | POA: Insufficient documentation

## 2017-07-07 DIAGNOSIS — R0981 Nasal congestion: Secondary | ICD-10-CM | POA: Diagnosis not present

## 2017-07-07 MED ORDER — PANTOPRAZOLE SODIUM 40 MG PO TBEC: 40.0000 mg | DELAYED_RELEASE_TABLET | Freq: Every day | ORAL | 3 refills | Status: DC

## 2017-07-07 MED ORDER — AZITHROMYCIN 250 MG PO TABS: ORAL_TABLET | ORAL | 0 refills | Status: DC

## 2017-07-07 MED ORDER — TRIAMCINOLONE ACETONIDE 0.5 % EX CREA: 1.0000 "application " | TOPICAL_CREAM | Freq: Three times a day (TID) | CUTANEOUS | 1 refills | Status: AC | PRN

## 2017-07-07 NOTE — Assessment & Plan Note (Signed)
Treat GERD Zpac

## 2017-07-07 NOTE — Progress Notes (Signed)
Subjective:  Patient ID: Tracy Sampson, female    DOB: [birth year]  Age: 62 y.o. MRN: [id number]  CC: No chief complaint on file.   HPI Tracy Sampson presents for lower abd periodic skin irritation C/o post-nasal drainage - orange mucus  Outpatient Medications Prior to Visit  Medication Sig Dispense Refill  . ALPHAGAN P 0.1 % SOLN Place 1 drop into both eyes 2 (two) times daily.  0  . amLODipine (NORVASC) 5 MG tablet Take 1 tablet (5 mg total) by mouth daily. 90 tablet 2  . amLODipine (NORVASC) 5 MG tablet TAKE ONE TABLET BY MOUTH DAILY 90 tablet 1  . doxazosin (CARDURA) 4 MG tablet Take 1 tablet (4 mg total) by mouth daily. 90 tablet 2  . ibuprofen (ADVIL,MOTRIN) 600 MG tablet Take 1 tablet (600 mg total) by mouth every 8 (eight) hours as needed. (Patient taking differently: Take 600 mg by mouth every 8 (eight) hours as needed for moderate pain. ) 90 tablet 1  . levothyroxine (SYNTHROID, LEVOTHROID) 150 MCG tablet Take 1 tablet (150 mcg total) by mouth daily. 30 tablet 5  . oxybutynin (DITROPAN XL) 10 MG 24 hr tablet Take 1 tablet (10 mg total) by mouth at bedtime. 30 tablet 11  . sertraline (ZOLOFT) 100 MG tablet Take 1 tablet (100 mg total) by mouth daily. Must keep appt on 12/04/16 to get refiils 90 tablet 2  . vitamin B-12 (CYANOCOBALAMIN) 500 MCG tablet Take 500 mcg by mouth daily.     No facility-administered medications prior to visit.     ROS Review of Systems  Constitutional: Negative for activity change, appetite change, chills, fatigue and unexpected weight change.  HENT: Positive for congestion and postnasal drip. Negative for mouth sores and sinus pressure.   Eyes: Negative for visual disturbance.  Respiratory: Negative for cough and chest tightness.   Gastrointestinal: Negative for abdominal pain and nausea.  Genitourinary: Negative for difficulty urinating, frequency and vaginal pain.  Musculoskeletal: Negative for back pain and gait problem.  Skin: Positive  for rash. Negative for pallor.  Neurological: Negative for dizziness, tremors, weakness, numbness and headaches.  Psychiatric/Behavioral: Negative for confusion and sleep disturbance.    Objective:  BP 132/84 (BP Location: Left Arm, Patient Position: Sitting, Cuff Size: Large)   Pulse 62   Temp 97.9 F (36.6 C) (Oral)   Ht 5' 5.5" (1.664 m)   Wt (!) 304 lb (137.9 kg)   SpO2 97%   BMI 49.82 kg/m   BP Readings from Last 3 Encounters:  07/07/17 132/84  12/04/16 (!) 146/88  11/16/16 118/74    Wt Readings from Last 3 Encounters:  07/07/17 (!) 304 lb (137.9 kg)  12/04/16 297 lb (134.7 kg)  11/16/16 295 lb (133.8 kg)    Physical Exam  Constitutional: She appears well-developed. No distress.  HENT:  Head: Normocephalic.  Right Ear: External ear normal.  Left Ear: External ear normal.  Nose: Nose normal.  Mouth/Throat: Oropharynx is clear and moist.  Eyes: Conjunctivae are normal. Pupils are equal, round, and reactive to light. Right eye exhibits no discharge. Left eye exhibits no discharge.  Neck: Normal range of motion. Neck supple. No JVD present. No tracheal deviation present. No thyromegaly present.  Cardiovascular: Normal rate, regular rhythm and normal heart sounds.  Pulmonary/Chest: No stridor. No respiratory distress. She has no wheezes.  Abdominal: Soft. Bowel sounds are normal. She exhibits no distension and no mass. There is no tenderness. There is no rebound and no guarding.  Musculoskeletal: She exhibits no edema or tenderness.  Lymphadenopathy:    She has no cervical adenopathy.  Neurological: She displays normal reflexes. No cranial nerve deficit. She exhibits normal muscle tone. Coordination normal.  Skin: No rash noted. No erythema.  Psychiatric: She has a normal mood and affect. Her behavior is normal. Judgment and thought content normal.  small residual skin rash patch on lower abd Obese  Lab Results  Component Value Date   WBC 6.0 12/04/2016   HGB 12.5  12/04/2016   HCT 38.9 12/04/2016   PLT 258.0 12/04/2016   GLUCOSE 103 (H) 12/04/2016   CHOL 200 12/04/2016   TRIG 88.0 12/04/2016   HDL 45.20 12/04/2016   LDLDIRECT 149.3 10/23/2010   LDLCALC 137 (H) 12/04/2016   ALT 18 12/04/2016   AST 14 12/04/2016   NA 142 12/04/2016   K 4.2 12/04/2016   CL 108 12/04/2016   CREATININE 0.76 12/04/2016   BUN 14 12/04/2016   CO2 29 12/04/2016   TSH 0.33 (L) 01/15/2017   HGBA1C 5.5 12/04/2016    No results found.  Assessment & Plan:   There are no diagnoses linked to this encounter. I am having Graylin Shiver maintain her vitamin B-12, ALPHAGAN P, ibuprofen, oxybutynin, amLODipine, sertraline, levothyroxine, doxazosin, and amLODipine.  No orders of the defined types were placed in this encounter.    Follow-up: No Follow-up on file.  Walker Kehr, MD

## 2017-07-07 NOTE — Assessment & Plan Note (Signed)
2/19 abdomen - ?nickel dermatitis Avoid skin contact w/metal buttons/buckles triamc cream

## 2017-07-07 NOTE — Assessment & Plan Note (Signed)
Protonix.  ?

## 2017-07-28 ENCOUNTER — Telehealth: Payer: Self-pay | Admitting: Internal Medicine

## 2017-07-28 MED ORDER — SERTRALINE HCL 100 MG PO TABS: 100.0000 mg | ORAL_TABLET | Freq: Every day | ORAL | 2 refills | Status: DC

## 2017-07-28 MED ORDER — AMLODIPINE BESYLATE 5 MG PO TABS: 5.0000 mg | ORAL_TABLET | Freq: Every day | ORAL | 1 refills | Status: DC

## 2017-07-28 MED ORDER — LEVOTHYROXINE SODIUM 150 MCG PO TABS: 150.0000 ug | ORAL_TABLET | Freq: Every day | ORAL | 5 refills | Status: DC

## 2017-07-28 MED ORDER — DOXAZOSIN MESYLATE 4 MG PO TABS: 4.0000 mg | ORAL_TABLET | Freq: Every day | ORAL | 2 refills | Status: DC

## 2017-07-28 NOTE — Telephone Encounter (Signed)
Called patient to clarify which medications that she is out of. No answer, left message for her to call us back.

## 2017-07-28 NOTE — Telephone Encounter (Signed)
Patient called to clarify which refills she needed, left VM to call the office back, medications refilled to requested pharmacy [facility].

## 2017-07-28 NOTE — Telephone Encounter (Signed)
Copied from [place] [phone]. Topic: Quick Communication - Rx Refill/Question >> Jul 28, 2017  8:27 AM Aurelio Brash B wrote: Medication: levothyroxine (SYNTHROID, LEVOTHROID) 150 MCG tablet  amLODipine (NORVASC) 5 MG tablet  doxazosin (CARDURA) 4 MG tablet  sertraline (ZOLOFT) 100 MG tablet  Needs 90 day supply  PT is out of medication- sending as high priority  Has the patient contacted their pharmacy? No changing pharmacy   (Agent: If no, request that the patient contact the pharmacy for the refill.)   Preferred Pharmacy (with phone number or street name): [id number] - [place], [place] - [address] AT [place]: Please be advised that RX refills may take up to 3 business days. We ask that you follow-up with your pharmacy.

## 2017-08-03 ENCOUNTER — Ambulatory Visit: Payer: Managed Care, Other (non HMO) | Admitting: Internal Medicine

## 2017-10-15 ENCOUNTER — Ambulatory Visit: Payer: Managed Care, Other (non HMO) | Admitting: Internal Medicine

## 2017-11-01 ENCOUNTER — Other Ambulatory Visit: Payer: Self-pay | Admitting: Internal Medicine

## 2017-11-01 DIAGNOSIS — Z1231 Encounter for screening mammogram for malignant neoplasm of breast: Secondary | ICD-10-CM

## 2017-11-13 ENCOUNTER — Ambulatory Visit (HOSPITAL_BASED_OUTPATIENT_CLINIC_OR_DEPARTMENT_OTHER): Payer: Managed Care, Other (non HMO)

## 2017-11-15 ENCOUNTER — Ambulatory Visit (INDEPENDENT_AMBULATORY_CARE_PROVIDER_SITE_OTHER): Payer: Managed Care, Other (non HMO) | Admitting: Internal Medicine

## 2017-11-15 ENCOUNTER — Ambulatory Visit (HOSPITAL_BASED_OUTPATIENT_CLINIC_OR_DEPARTMENT_OTHER)
Admission: RE | Admit: 2017-11-15 | Discharge: 2017-11-15 | Disposition: A | Discharge status: Home / Self Care | Payer: Managed Care, Other (non HMO) | Source: Ambulatory Visit | Attending: Internal Medicine | Admitting: Internal Medicine

## 2017-11-15 ENCOUNTER — Encounter: Payer: Self-pay | Admitting: Internal Medicine

## 2017-11-15 ENCOUNTER — Encounter (HOSPITAL_BASED_OUTPATIENT_CLINIC_OR_DEPARTMENT_OTHER): Payer: Self-pay

## 2017-11-15 VITALS — BP 140/80 | HR 78 | Ht 65.5 in | Wt 304.0 lb

## 2017-11-15 DIAGNOSIS — R7309 Other abnormal glucose: Secondary | ICD-10-CM | POA: Diagnosis not present

## 2017-11-15 DIAGNOSIS — I10 Essential (primary) hypertension: Secondary | ICD-10-CM

## 2017-11-15 DIAGNOSIS — G8929 Other chronic pain: Secondary | ICD-10-CM

## 2017-11-15 DIAGNOSIS — Z1231 Encounter for screening mammogram for malignant neoplasm of breast: Secondary | ICD-10-CM | POA: Diagnosis present

## 2017-11-15 DIAGNOSIS — M25511 Pain in right shoulder: Secondary | ICD-10-CM | POA: Insufficient documentation

## 2017-11-15 DIAGNOSIS — E034 Atrophy of thyroid (acquired): Secondary | ICD-10-CM | POA: Diagnosis not present

## 2017-11-15 DIAGNOSIS — E559 Vitamin D deficiency, unspecified: Secondary | ICD-10-CM | POA: Diagnosis not present

## 2017-11-15 DIAGNOSIS — N3281 Overactive bladder: Secondary | ICD-10-CM | POA: Diagnosis not present

## 2017-11-15 MED ORDER — IBUPROFEN 600 MG PO TABS: 600.0000 mg | ORAL_TABLET | Freq: Three times a day (TID) | ORAL | 1 refills | Status: DC | PRN

## 2017-11-15 MED ORDER — OXYBUTYNIN CHLORIDE ER 10 MG PO TB24: 10.0000 mg | ORAL_TABLET | Freq: Every day | ORAL | 11 refills | Status: DC

## 2017-11-15 NOTE — Assessment & Plan Note (Signed)
On Amlodipine, cardura

## 2017-11-15 NOTE — Assessment & Plan Note (Signed)
Vit D 

## 2017-11-15 NOTE — Assessment & Plan Note (Signed)
?  biceps tendonitis Ice Ibuprofen Sports Med ref offered

## 2017-11-15 NOTE — Patient Instructions (Addendum)
Biceps Tendon Tendinitis (Proximal) and Tenosynovitis The proximal biceps tendon is a strong cord of tissue that connects the biceps muscle, on the front of the upper arm, to the shoulder blade. Tendinitis is inflammation of a tendon. Tenosynovitis is inflammation of the lining around the tendon (tendon sheath). These conditions often occur at the same time, and they can interfere with the ability to bend the elbow and turn the hand palm-up (supination). Proximal biceps tendon tendinitis and tenosynovitis are usually caused by overusing the shoulder joint and the biceps muscle. These conditions usually heal within 6 weeks. Proximal biceps tendon tendinitis may include a grade 1 or grade 2 strain of the tendon. A grade 1 strain is mild, and it involves a slight pull of the tendon without any stretching or noticeable tearing of the tendon. There is usually no loss of biceps muscle strength. A grade 2 strain is moderate, and it involves a small tear in the tendon. The tendon is stretched, and biceps strength is usually decreased. What are the causes? This condition may be caused by:  A sudden increase in frequency or intensity of activity that involves the shoulder and the biceps muscle.  Overuse of the biceps muscle. This can happen when you do the same movements over and over, such as: ? Supination. ? Forceful straightening (hyperextension) of the elbow. ? Bending the elbow.  A direct, forceful hit or injury (trauma) to the elbow. This is rare.  What increases the risk? The following factors may make you more likely to develop this condition:  Playing contact sports.  Playing sports that involve throwing and overhead movements, including racket sports, gymnastics, weight lifting, or bodybuilding.  Doing physical labor.  Having poor strength and flexibility of the arm and shoulder.  What are the signs or symptoms? Symptoms of this condition may include:  Pain and inflammation in the front  of the shoulder. Pain may get worse with movement, especially when you use resistance, as in weight lifting.  A feeling of warmth in the front of the shoulder.  Limited range of motion of the shoulder and the elbow.  A crackling sound (crepitation) when you move or touch the shoulder or the upper arm.  In some cases, symptoms may return (recur) after treatment, and they may be long-lasting (chronic). How is this diagnosed? This condition is diagnosed based on your symptoms, your medical history, and a physical exam. You may have tests, including X-rays or MRIs. Your health care provider may test your range of motion by asking you to do arm movements. How is this treated? This condition is treated by resting and icing the injured area, and by doing physical therapy exercises. Depending on the severity of your condition, treatment may also include:  Medicines to help relieve pain and inflammation.  Ultrasound therapy. This is the application of sound waves to the injured area.  Injecting medicines (corticosteroids) into your tendon sheath.  Injecting medicines that numb the area (local anesthetics).  Surgery to remove the damaged part of the tendon and reattach the undamaged part of the tendon to the arm bone (humerus). This is usually only done if you have symptoms that do not get better with other treatment methods.  Follow these instructions at home: Managing pain, stiffness, and swelling  If directed, put ice on the injured area: ? Put ice in a plastic bag. ? Place a towel between your skin and the bag. ? Leave the ice on for 20 minutes, 2-3 times a day.    Move your fingers often to avoid stiffness and to lessen swelling.  Raise (elevate) the injured area above the level of your heart while you are sitting or lying down.  If directed, apply heat to the affected area before you exercise. Use the heat source that your health care provider recommends, such as a moist heat pack or a  heating pad. ? Place a towel between your skin and the heat source. ? Leave the heat on for 20-30 minutes. ? Remove the heat if your skin turns bright red. This is especially important if you are unable to feel pain, heat, or cold. You may have a greater risk of getting burned. Activity  Return to your normal activities as told by your health care provider. Ask your health care provider what activities are safe for you.  Do not lift anything that is heavier than 10 lb (4.5 kg) until your health care provider tells you that it is safe.  Avoid activities that cause pain or make your condition worse.  Do exercises as told by your health care provider. General instructions  Take over-the-counter and prescription medicines only as told by your health care provider.  Do not drive or operate heavy machinery while taking prescription pain medicines.  Keep all follow-up visits as told by your health care provider. This is important. How is this prevented?  Warm up and stretch before being active.  Cool down and stretch after being active.  Give your body time to rest between periods of activity.  Make sure any equipment that you use is fitted to you.  Be safe and responsible while being active to avoid falls.  Do at least 150 minutes of moderate-intensity aerobic exercise each week, such as brisk walking or water aerobics.  Maintain physical fitness, including: ? Strength. ? Flexibility. ? Cardiovascular fitness. ? Endurance. Contact a health care provider if:  You have symptoms that get worse or do not get better after 2 weeks of treatment.  You develop new symptoms. Get help right away if:  You develop severe pain. This information is not intended to replace advice given to you by your health care provider. Make sure you discuss any questions you have with your health care provider. Document Released: 05/04/2005 Document Revised: 01/09/2016 Document Reviewed:  04/12/2015 Elsevier Interactive Patient Education  2018 [employer].  Overactive Bladder, Adult Overactive bladder is a group of urinary symptoms. With overactive bladder, you may suddenly feel the need to pass urine (urinate) right away. After feeling this sudden urge, you might also leak urine if you cannot get to the bathroom fast enough (urinary incontinence). These symptoms might interfere with your daily work or social activities. Overactive bladder symptoms may also wake you up at night. Overactive bladder affects the nerve signals between your bladder and your brain. Your bladder may get the signal to empty before it is full. Very sensitive muscles can also make your bladder squeeze too soon. What are the causes? Many things can cause an overactive bladder. Possible causes include:  Urinary tract infection.  Infection of nearby tissues, such as the prostate.  Prostate enlargement.  Being pregnant with twins or more (multiples).  Surgery on the uterus or urethra.  Bladder stones, inflammation, or tumors.  Drinking too much caffeine or alcohol.  Certain medicines, especially those that you take to help your body get rid of extra fluid (diuretics) by increasing urine production.  Muscle or nerve weakness, especially from: ? A spinal cord injury. ? Stroke. ?  Multiple sclerosis. ? Parkinson disease.  Diabetes. This can cause a high urine volume that fills the bladder so quickly that the normal urge to urinate is triggered very strongly.  Constipation. A buildup of too much stool can put pressure on your bladder.  What increases the risk? You may be at greater risk for overactive bladder if you:  Are an older adult.  Smoke.  Are going through menopause.  Have prostate problems.  Have a neurological disease, such as stroke, dementia, Parkinson disease, or multiple sclerosis (MS).  Eat or drink things that irritate the bladder. These include alcohol, spicy food, and  caffeine.  Are overweight or obese.  What are the signs or symptoms? The signs and symptoms of an overactive bladder include:  Sudden, strong urges to urinate.  Leaking urine.  Urinating eight or more times per day.  Waking up to urinate two or more times per night.  How is this diagnosed? Your health care provider may suspect overactive bladder based on your symptoms. The health care provider will do a physical exam and take your medical history. Blood or urine tests may also be done. For example, you might need to have a bladder function test to check how well you can hold your urine. You might also need to see a health care provider who specializes in the urinary tract (urologist). How is this treated? Treatment for overactive bladder depends on the cause of your condition and whether it is mild or severe. Certain treatments can be done in your health care provider's office or clinic. You can also make lifestyle changes at home. Options include: Behavioral Treatments  Biofeedback. A specialist uses sensors to help you become aware of your body's signals.  Keeping a daily log of when you need to urinate and what happens after the urge. This may help you manage your condition.  Bladder training. This helps you learn to control the urge to urinate by following a schedule that directs you to urinate at regular intervals (timed voiding). At first, you might have to wait a few minutes after feeling the urge. In time, you should be able to schedule bathroom visits an hour or more apart.  Kegel exercises. These are exercises to strengthen the pelvic floor muscles, which support the bladder. Toning these muscles can help you control urination, even if your bladder muscles are overactive. A specialist will teach you how to do these exercises correctly. They require daily practice.  Weight loss. If you are obese or overweight, losing weight might relieve your symptoms of overactive bladder. Talk  to your health care provider about losing weight and whether there is a specific program or method that would work best for you.  Diet change. This might help if constipation is making your overactive bladder worse. Your health care provider or a dietitian can explain ways to change what you eat to ease constipation. You might also need to consume less alcohol and caffeine or drink other fluids at different times of the day.  Stopping smoking.  Wearing pads to absorb leakage while you wait for other treatments to take effect. Physical Treatments  Electrical stimulation. Electrodes send gentle pulses of electricity to strengthen the nerves or muscles that help to control the bladder. Sometimes, the electrodes are placed outside of the body. In other cases, they might be placed inside the body (implanted). This treatment can take several months to have an effect.  Supportive devices. Women may need a plastic device that fits into the  vagina and supports the bladder (pessary). Medicines Several medicines can help treat overactive bladder and are usually used along with other treatments. Some are injected into the muscles involved in urination. Others come in pill form. Your health care provider may prescribe:  Antispasmodics. These medicines block the signals that the nerves send to the bladder. This keeps the bladder from releasing urine at the wrong time.  Tricyclic antidepressants. These types of antidepressants also relax bladder muscles.  Surgery  You may have a device implanted to help manage the nerve signals that indicate when you need to urinate.  You may have surgery to implant electrodes for electrical stimulation.  Sometimes, very severe cases of overactive bladder require surgery to change the shape of the bladder. Follow these instructions at home:  Take medicines only as directed by your health care provider.  Use any implants or a pessary as directed by your health care  provider.  Make any diet or lifestyle changes that are recommended by your health care provider. These might include: ? Drinking less fluid or drinking at different times of the day. If you need to urinate often during the night, you may need to stop drinking fluids early in the evening. ? Cutting down on caffeine or alcohol. Both can make an overactive bladder worse. Caffeine is found in coffee, tea, and sodas. ? Doing Kegel exercises to strengthen muscles. ? Losing weight if you need to. ? Eating a healthy and balanced diet to prevent constipation.  Keep a journal or log to track how much and when you drink and also when you feel the need to urinate. This will help your health care provider to monitor your condition. Contact a health care provider if:  Your symptoms do not get better after treatment.  Your pain and discomfort are getting worse.  You have more frequent urges to urinate.  You have a fever. Get help right away if: You are not able to control your bladder at all. This information is not intended to replace advice given to you by your health care provider. Make sure you discuss any questions you have with your health care provider. Document Released: 02/28/2009 Document Revised: 10/10/2015 Document Reviewed: 09/27/2013 Elsevier Interactive Patient Education  [employer].

## 2017-11-15 NOTE — Assessment & Plan Note (Signed)
Levoxyl 

## 2017-11-15 NOTE — Assessment & Plan Note (Signed)
Discussed options She will think about it

## 2017-11-15 NOTE — Assessment & Plan Note (Signed)
Dr [facility] appt was offered - will refer

## 2017-11-15 NOTE — Progress Notes (Signed)
Subjective:  Patient ID: Tracy Sampson, female    DOB: [birth year]  Age: 62 y.o. MRN: [id number]  CC: No chief complaint on file.   HPI Tracy Sampson presents for HTN, anxiety, B12 def f/u C/o chronic R shoulder pain/prox arm x 2 weeks - worse  Outpatient Medications Prior to Visit  Medication Sig Dispense Refill  . ALPHAGAN P 0.1 % SOLN Place 1 drop into both eyes 2 (two) times daily.  0  . amLODipine (NORVASC) 5 MG tablet Take 1 tablet (5 mg total) by mouth daily. 90 tablet 1  . dorzolamide-timolol (COSOPT) 22.3-6.8 MG/ML ophthalmic solution     . doxazosin (CARDURA) 4 MG tablet Take 1 tablet (4 mg total) by mouth daily. 90 tablet 2  . ibuprofen (ADVIL,MOTRIN) 600 MG tablet Take 1 tablet (600 mg total) by mouth every 8 (eight) hours as needed. (Patient taking differently: Take 600 mg by mouth every 8 (eight) hours as needed for moderate pain. ) 90 tablet 1  . levothyroxine (SYNTHROID, LEVOTHROID) 150 MCG tablet Take 1 tablet (150 mcg total) by mouth daily. 30 tablet 5  . oxybutynin (DITROPAN XL) 10 MG 24 hr tablet Take 1 tablet (10 mg total) by mouth at bedtime. 30 tablet 11  . pantoprazole (PROTONIX) 40 MG tablet Take 1 tablet (40 mg total) by mouth daily. 30 tablet 3  . sertraline (ZOLOFT) 100 MG tablet Take 1 tablet (100 mg total) by mouth daily. 90 tablet 2  . triamcinolone cream (KENALOG) 0.5 % Apply 1 application topically 3 (three) times daily as needed. On rash 30 g 1  . vitamin B-12 (CYANOCOBALAMIN) 500 MCG tablet Take 500 mcg by mouth daily.    [name] azithromycin (ZITHROMAX Z-PAK) 250 MG tablet As directed (Patient not taking: Reported on 11/15/2017) 6 each 0   No facility-administered medications prior to visit.     ROS: Review of Systems  Constitutional: Positive for unexpected weight change. Negative for activity change, appetite change, chills and fatigue.  HENT: Negative for congestion, mouth sores and sinus pressure.   Eyes: Negative for visual disturbance.    Respiratory: Negative for cough and chest tightness.   Gastrointestinal: Negative for abdominal pain and nausea.  Genitourinary: Negative for difficulty urinating, frequency and vaginal pain.  Musculoskeletal: Negative for back pain and gait problem.  Skin: Negative for pallor and rash.  Neurological: Negative for dizziness, tremors, weakness, numbness and headaches.  Psychiatric/Behavioral: Negative for confusion and sleep disturbance.    Objective:  BP 140/80 (BP Location: Left Arm, Patient Position: Sitting, Cuff Size: Large)   Pulse 78   Ht 5' 5.5" (1.664 m)   Wt (!) 304 lb (137.9 kg)   SpO2 96%   BMI 49.82 kg/m   BP Readings from Last 3 Encounters:  11/15/17 140/80  07/07/17 132/84  12/04/16 (!) 146/88    Wt Readings from Last 3 Encounters:  11/15/17 (!) 304 lb (137.9 kg)  07/07/17 (!) 304 lb (137.9 kg)  12/04/16 297 lb (134.7 kg)    Physical Exam  Constitutional: She appears well-developed. No distress.  HENT:  Head: Normocephalic.  Right Ear: External ear normal.  Left Ear: External ear normal.  Nose: Nose normal.  Mouth/Throat: Oropharynx is clear and moist.  Eyes: Pupils are equal, round, and reactive to light. Conjunctivae are normal. Right eye exhibits no discharge. Left eye exhibits no discharge.  Neck: Normal range of motion. Neck supple. No JVD present. No tracheal deviation present. No thyromegaly present.  Cardiovascular: Normal rate, regular  rhythm and normal heart sounds.  Pulmonary/Chest: No stridor. No respiratory distress. She has no wheezes.  Abdominal: Soft. Bowel sounds are normal. She exhibits no distension and no mass. There is no tenderness. There is no rebound and no guarding.  Musculoskeletal: She exhibits tenderness. She exhibits no edema.  Lymphadenopathy:    She has no cervical adenopathy.  Neurological: She displays normal reflexes. No cranial nerve deficit. She exhibits normal muscle tone. Coordination normal.  Skin: No rash noted. No  erythema.  Psychiatric: She has a normal mood and affect. Her behavior is normal. Judgment and thought content normal.  obese Bicip tendon - tender to palpation on the R  Lab Results  Component Value Date   WBC 6.0 12/04/2016   HGB 12.5 12/04/2016   HCT 38.9 12/04/2016   PLT 258.0 12/04/2016   GLUCOSE 103 (H) 12/04/2016   CHOL 200 12/04/2016   TRIG 88.0 12/04/2016   HDL 45.20 12/04/2016   LDLDIRECT 149.3 10/23/2010   LDLCALC 137 (H) 12/04/2016   ALT 18 12/04/2016   AST 14 12/04/2016   NA 142 12/04/2016   K 4.2 12/04/2016   CL 108 12/04/2016   CREATININE 0.76 12/04/2016   BUN 14 12/04/2016   CO2 29 12/04/2016   TSH 0.33 (L) 01/15/2017   HGBA1C 5.5 12/04/2016    No results found.  Assessment & Plan:   There are no diagnoses linked to this encounter.   No orders of the defined types were placed in this encounter.    Follow-up: No follow-ups on file.  Walker Kehr, MD

## 2017-11-17 ENCOUNTER — Ambulatory Visit: Payer: 59 | Admitting: Obstetrics and Gynecology

## 2018-01-11 ENCOUNTER — Other Ambulatory Visit: Payer: Self-pay

## 2018-01-11 MED ORDER — LEVOTHYROXINE SODIUM 150 MCG PO TABS: 150.0000 ug | ORAL_TABLET | Freq: Every day | ORAL | 1 refills | Status: DC

## 2018-02-22 ENCOUNTER — Telehealth: Payer: Self-pay | Admitting: Pulmonary Disease

## 2018-02-24 NOTE — Telephone Encounter (Signed)
Will close this encounter.  

## 2018-03-02 ENCOUNTER — Other Ambulatory Visit: Payer: Self-pay

## 2018-03-02 MED ORDER — AMLODIPINE BESYLATE 5 MG PO TABS: 5.0000 mg | ORAL_TABLET | Freq: Every day | ORAL | 1 refills | Status: DC

## 2018-04-08 ENCOUNTER — Other Ambulatory Visit: Payer: Self-pay | Admitting: *Deleted

## 2018-04-08 MED ORDER — DOXAZOSIN MESYLATE 4 MG PO TABS: 4.0000 mg | ORAL_TABLET | Freq: Every day | ORAL | 0 refills | Status: DC

## 2018-04-08 NOTE — Telephone Encounter (Signed)
Summary: quanity changed   Patient states she went to the pharmacy to pick up her doxazosin (CARDURA) 4 MG tablet and they had it ready for 30 tablets. Patient did not pick up the pills. Patient said she would like it to be changed to 90 day supply because she can get it free with her insurance if it's 90 pills instead of 30 pills. Patient is completley out. Spoke with Rhae Lerner and she said that the patient should have just went ahead and picked up the 30 pills this time and that we would speak with Dr Alain Marion to even see if this drug could be 90 days. She said for this time go ahead and make this clinical call for the patient.

## 2018-04-12 ENCOUNTER — Ambulatory Visit: Payer: Managed Care, Other (non HMO) | Admitting: Pulmonary Disease

## 2018-05-05 NOTE — Progress Notes (Deleted)
62 y.o. G0P0000 Single Black or African American Not Hispanic or Latino female here for annual exam.      No LMP recorded. Patient has had a hysterectomy.          Sexually active: {yes no:314532}  The current method of family planning is {contraception:315051}.    Exercising: {yes no:314532}  {types:19826} Smoker:  {YES P5382123  Health Maintenance: Pap:  11/16/2016 WNL NEG HR HPV History of abnormal Pap:  no MMG:  11/15/2017 Birads 1 negative Colonoscopy:  07-12-15 polyps repeat in 5 year BMD:   Never TDaP:  12-04-14  Gardasil: NA   reports that she has never smoked. She has never used smokeless tobacco. She reports that she does not drink alcohol or use drugs.  Past Medical History:  Diagnosis Date  . Allergic rhinitis   . Allergy   . Arthritis   . Cataract    bilateral removed  . Depression   . GERD (gastroesophageal reflux disease)   . Glaucoma   . Hypertension   . Hypothyroidism   . Morbid obesity ([place])   . OSA (obstructive sleep apnea)    Dr Joya Gaskins  . Sleep apnea    wears cpap  . Thyroid disease   . Urinary incontinence     Past Surgical History:  Procedure Laterality Date  . abdominal supracervical hysterectomy with bilateral salpingo-oophorectomy     fibroid uterus  . ANKLE SURGERY  4/10   rt  . BREAST REDUCTION SURGERY    . CATARACT EXTRACTION    . COLONOSCOPY    . HERNIA REPAIR  8250   umbillical  . INSERTION OF MESH N/A 01/07/2016   Procedure: INSERTION OF MESH;  Surgeon: Coralie Keens, MD;  Location: [facility];  Service: General;  Laterality: N/A;  . REDUCTION MAMMAPLASTY    . VENTRAL HERNIA REPAIR N/A 01/07/2016   Procedure: VENTRAL HERNIA REPAIR;  Surgeon: Coralie Keens, MD;  Location: [facility];  Service: General;  Laterality: N/A;    Current Outpatient Medications  Medication Sig Dispense Refill  . ALPHAGAN P 0.1 % SOLN Place 1 drop into both eyes 2 (two) times daily.  0  . amLODipine (NORVASC) 5 MG tablet Take 1 tablet (5 mg total) by mouth  daily. 90 tablet 1  . dorzolamide-timolol (COSOPT) 22.3-6.8 MG/ML ophthalmic solution     . doxazosin (CARDURA) 4 MG tablet Take 1 tablet (4 mg total) by mouth daily. 90 tablet 0  . ibuprofen (ADVIL,MOTRIN) 600 MG tablet Take 1 tablet (600 mg total) by mouth every 8 (eight) hours as needed. 30 tablet 1  . levothyroxine (SYNTHROID, LEVOTHROID) 150 MCG tablet Take 1 tablet (150 mcg total) by mouth daily. 90 tablet 1  . oxybutynin (DITROPAN XL) 10 MG 24 hr tablet Take 1 tablet (10 mg total) by mouth at bedtime. 30 tablet 11  . pantoprazole (PROTONIX) 40 MG tablet Take 1 tablet (40 mg total) by mouth daily. 30 tablet 3  . sertraline (ZOLOFT) 100 MG tablet Take 1 tablet (100 mg total) by mouth daily. 90 tablet 2  . triamcinolone cream (KENALOG) 0.5 % Apply 1 application topically 3 (three) times daily as needed. On rash 30 g 1  . vitamin B-12 (CYANOCOBALAMIN) 500 MCG tablet Take 500 mcg by mouth daily.     No current facility-administered medications for this visit.     Family History  Problem Relation Age of Onset  . Breast cancer Mother   . Cancer Mother        breast  .  Hypertension Mother   . Colon cancer Maternal Grandmother   . Cancer Maternal Grandmother        colon    Review of Systems  Exam:   There were no vitals taken for this visit.  Weight change: @WEIGHTCHANGE @ Height:      Ht Readings from Last 3 Encounters:  11/15/17 5' 5.5" (1.664 m)  07/07/17 5' 5.5" (1.664 m)  12/04/16 5' 5.5" (1.664 m)    General appearance: alert, cooperative and appears stated age Head: Normocephalic, without obvious abnormality, atraumatic Neck: no adenopathy, supple, symmetrical, trachea midline and thyroid {CHL AMB PHY EX THYROID NORM DEFAULT:[phone]::"normal to inspection and palpation"} Lungs: clear to auscultation bilaterally Cardiovascular: regular rate and rhythm Breasts: {Exam; breast:13139::"normal appearance, no masses or tenderness"} Abdomen: soft, non-tender; non distended,   no masses,  no organomegaly Extremities: extremities normal, atraumatic, no cyanosis or edema Skin: Skin color, texture, turgor normal. No rashes or lesions Lymph nodes: Cervical, supraclavicular, and axillary nodes normal. No abnormal inguinal nodes palpated Neurologic: Grossly normal   Pelvic: External genitalia:  no lesions              Urethra:  normal appearing urethra with no masses, tenderness or lesions              Bartholins and Skenes: normal                 Vagina: normal appearing vagina with normal color and discharge, no lesions              Cervix: {CHL AMB PHY EX CERVIX NORM DEFAULT:[phone]::"no lesions"}               Bimanual Exam:  Uterus:  {CHL AMB PHY EX UTERUS NORM DEFAULT:[id number]::"normal size, contour, position, consistency, mobility, non-tender"}              Adnexa: {CHL AMB PHY EX ADNEXA NO MASS DEFAULT:[id number]::"no mass, fullness, tenderness"}               Rectovaginal: Confirms               Anus:  normal sphincter tone, no lesions  Chaperone was present for exam.  A:  Well Woman with normal exam  P:

## 2018-05-09 ENCOUNTER — Ambulatory Visit (INDEPENDENT_AMBULATORY_CARE_PROVIDER_SITE_OTHER): Payer: Managed Care, Other (non HMO) | Admitting: Internal Medicine

## 2018-05-09 ENCOUNTER — Other Ambulatory Visit (INDEPENDENT_AMBULATORY_CARE_PROVIDER_SITE_OTHER): Payer: Managed Care, Other (non HMO)

## 2018-05-09 ENCOUNTER — Encounter: Payer: Self-pay | Admitting: Pulmonary Disease

## 2018-05-09 ENCOUNTER — Ambulatory Visit (INDEPENDENT_AMBULATORY_CARE_PROVIDER_SITE_OTHER): Payer: Managed Care, Other (non HMO) | Admitting: Pulmonary Disease

## 2018-05-09 ENCOUNTER — Telehealth: Payer: Self-pay | Admitting: Obstetrics and Gynecology

## 2018-05-09 ENCOUNTER — Ambulatory Visit: Payer: Managed Care, Other (non HMO) | Admitting: Obstetrics and Gynecology

## 2018-05-09 ENCOUNTER — Encounter: Payer: Self-pay | Admitting: Internal Medicine

## 2018-05-09 DIAGNOSIS — G4733 Obstructive sleep apnea (adult) (pediatric): Secondary | ICD-10-CM

## 2018-05-09 DIAGNOSIS — R32 Unspecified urinary incontinence: Secondary | ICD-10-CM

## 2018-05-09 DIAGNOSIS — R7309 Other abnormal glucose: Secondary | ICD-10-CM | POA: Diagnosis not present

## 2018-05-09 DIAGNOSIS — N3281 Overactive bladder: Secondary | ICD-10-CM

## 2018-05-09 DIAGNOSIS — J3089 Other allergic rhinitis: Secondary | ICD-10-CM | POA: Diagnosis not present

## 2018-05-09 DIAGNOSIS — F3341 Major depressive disorder, recurrent, in partial remission: Secondary | ICD-10-CM

## 2018-05-09 DIAGNOSIS — K219 Gastro-esophageal reflux disease without esophagitis: Secondary | ICD-10-CM

## 2018-05-09 DIAGNOSIS — Z9989 Dependence on other enabling machines and devices: Secondary | ICD-10-CM | POA: Diagnosis not present

## 2018-05-09 DIAGNOSIS — R0981 Nasal congestion: Secondary | ICD-10-CM

## 2018-05-09 DIAGNOSIS — E034 Atrophy of thyroid (acquired): Secondary | ICD-10-CM

## 2018-05-09 DIAGNOSIS — I10 Essential (primary) hypertension: Secondary | ICD-10-CM

## 2018-05-09 LAB — URINALYSIS, ROUTINE W REFLEX MICROSCOPIC
Bilirubin Urine: NEGATIVE
Ketones, ur: NEGATIVE
Nitrite: NEGATIVE
Specific Gravity, Urine: 1.025 (ref 1.000–1.030)
Total Protein, Urine: NEGATIVE
Urine Glucose: NEGATIVE
Urobilinogen, UA: 0.2 (ref 0.0–1.0)
pH: 6 (ref 5.0–8.0)

## 2018-05-09 LAB — BASIC METABOLIC PANEL
BUN: 11 mg/dL (ref 6–23)
CO2: 31 mEq/L (ref 19–32)
Calcium: 9.5 mg/dL (ref 8.4–10.5)
Chloride: 106 mEq/L (ref 96–112)
Creatinine, Ser: 0.74 mg/dL (ref 0.40–1.20)
GFR: 101.83 mL/min (ref >60.00)
Glucose, Bld: 94 mg/dL (ref 70–99)
Potassium: 3.8 mEq/L (ref 3.5–5.1)
Sodium: 144 mEq/L (ref 135–145)

## 2018-05-09 LAB — CBC WITH DIFFERENTIAL/PLATELET
Basophils Absolute: 0 10*3/uL (ref 0.0–0.1)
Basophils Relative: 0.4 % (ref 0.0–3.0)
Eosinophils Absolute: 0.2 10*3/uL (ref 0.0–0.7)
Eosinophils Relative: 3.4 % (ref 0.0–5.0)
HCT: 39.7 % (ref 36.0–46.0)
Hemoglobin: 12.9 g/dL (ref 12.0–15.0)
Lymphocytes Relative: 31.7 % (ref 12.0–46.0)
Lymphs Abs: 2.3 10*3/uL (ref 0.7–4.0)
MCHC: 32.5 g/dL (ref 30.0–36.0)
MCV: 92.5 fl (ref 78.0–100.0)
Monocytes Absolute: 0.5 10*3/uL (ref 0.1–1.0)
Monocytes Relative: 7 % (ref 3.0–12.0)
Neutro Abs: 4.2 10*3/uL (ref 1.4–7.7)
Neutrophils Relative %: 57.5 % (ref 43.0–77.0)
Platelets: 218 10*3/uL (ref 150.0–400.0)
RBC: 4.29 Mil/uL (ref 3.87–5.11)
RDW: 13.9 % (ref 11.5–15.5)
WBC: 7.3 10*3/uL (ref 4.0–10.5)

## 2018-05-09 LAB — LIPID PANEL
Cholesterol: 195 mg/dL (ref 0–200)
HDL: 45 mg/dL (ref >39.00)
LDL Cholesterol: 127 mg/dL — ABNORMAL HIGH (ref 0–99)
NonHDL: 149.5
Total CHOL/HDL Ratio: 4
Triglycerides: 111 mg/dL (ref 0.0–149.0)
VLDL: 22.2 mg/dL (ref 0.0–40.0)

## 2018-05-09 LAB — HEPATIC FUNCTION PANEL
ALT: 19 U/L (ref 0–35)
AST: 17 U/L (ref 0–37)
Albumin: 4.1 g/dL (ref 3.5–5.2)
Alkaline Phosphatase: 79 U/L (ref 39–117)
Bilirubin, Direct: 0.1 mg/dL (ref 0.0–0.3)
Total Bilirubin: 0.5 mg/dL (ref 0.2–1.2)
Total Protein: 7.5 g/dL (ref 6.0–8.3)

## 2018-05-09 LAB — TSH: TSH: 0.91 u[IU]/mL (ref 0.35–4.50)

## 2018-05-09 LAB — HEMOGLOBIN A1C: Hgb A1c MFr Bld: 5.7 % (ref 4.6–6.5)

## 2018-05-09 MED ORDER — LOSARTAN POTASSIUM 100 MG PO TABS: 100.0000 mg | ORAL_TABLET | Freq: Every day | ORAL | 3 refills | Status: DC

## 2018-05-09 MED ORDER — LORATADINE 10 MG PO TABS: 10.0000 mg | ORAL_TABLET | Freq: Every day | ORAL | 11 refills | Status: DC

## 2018-05-09 MED ORDER — LOSARTAN POTASSIUM 100 MG PO TABS: 100.0000 mg | ORAL_TABLET | Freq: Every day | ORAL | 11 refills | Status: DC

## 2018-05-09 MED ORDER — PANTOPRAZOLE SODIUM 40 MG PO TBEC: 40.0000 mg | DELAYED_RELEASE_TABLET | Freq: Every day | ORAL | 11 refills | Status: DC

## 2018-05-09 MED ORDER — MIRABEGRON ER 50 MG PO TB24: 50.0000 mg | ORAL_TABLET | Freq: Every day | ORAL | 11 refills | Status: DC

## 2018-05-09 NOTE — Progress Notes (Signed)
Subjective:  Patient ID: Tracy Sampson, female    DOB: 1955-12-07  Age: 62 y.o. MRN: [id number]  CC: No chief complaint on file.   HPI Tracy Sampson presents for urinary incontinence C/o phlegm in the throat. Not taking pantaprazol and Ditropan - did not help F/u HTN, obesity  Outpatient Medications Prior to Visit  Medication Sig Dispense Refill  . ALPHAGAN P 0.1 % SOLN Place 1 drop into both eyes 2 (two) times daily.  0  . amLODipine (NORVASC) 5 MG tablet Take 1 tablet (5 mg total) by mouth daily. 90 tablet 1  . dorzolamide-timolol (COSOPT) 22.3-6.8 MG/ML ophthalmic solution     . doxazosin (CARDURA) 4 MG tablet Take 1 tablet (4 mg total) by mouth daily. 90 tablet 0  . ibuprofen (ADVIL,MOTRIN) 600 MG tablet Take 1 tablet (600 mg total) by mouth every 8 (eight) hours as needed. 30 tablet 1  . levothyroxine (SYNTHROID, LEVOTHROID) 150 MCG tablet Take 1 tablet (150 mcg total) by mouth daily. 90 tablet 1  . oxybutynin (DITROPAN XL) 10 MG 24 hr tablet Take 1 tablet (10 mg total) by mouth at bedtime. 30 tablet 11  . pantoprazole (PROTONIX) 40 MG tablet Take 1 tablet (40 mg total) by mouth daily. 30 tablet 3  . sertraline (ZOLOFT) 100 MG tablet Take 1 tablet (100 mg total) by mouth daily. 90 tablet 2  . triamcinolone cream (KENALOG) 0.5 % Apply 1 application topically 3 (three) times daily as needed. On rash 30 g 1  . vitamin B-12 (CYANOCOBALAMIN) 500 MCG tablet Take 500 mcg by mouth daily.     No facility-administered medications prior to visit.     ROS: Review of Systems  Constitutional: Positive for unexpected weight change. Negative for activity change, appetite change, chills, diaphoresis, fatigue and fever.  HENT: Positive for congestion. Negative for ear pain, facial swelling, hearing loss, mouth sores, nosebleeds, postnasal drip, rhinorrhea, sinus pressure, sneezing, sore throat, tinnitus and trouble swallowing.   Eyes: Negative for pain, discharge, redness, itching and  visual disturbance.  Respiratory: Positive for choking. Negative for cough, chest tightness, shortness of breath, wheezing and stridor.   Cardiovascular: Negative for chest pain, palpitations and leg swelling.  Gastrointestinal: Negative for abdominal distention, anal bleeding, blood in stool, constipation, diarrhea, nausea and rectal pain.  Genitourinary: Positive for enuresis, frequency and urgency. Negative for difficulty urinating, dysuria, flank pain, genital sores, hematuria, pelvic pain, vaginal bleeding and vaginal discharge.  Musculoskeletal: Negative for arthralgias, back pain, gait problem, joint swelling, neck pain and neck stiffness.  Skin: Negative.  Negative for rash.  Neurological: Negative for dizziness, tremors, seizures, syncope, speech difficulty, weakness, numbness and headaches.  Hematological: Negative for adenopathy. Does not bruise/bleed easily.  Psychiatric/Behavioral: Positive for dysphoric mood. Negative for behavioral problems, decreased concentration, sleep disturbance and suicidal ideas. The patient is nervous/anxious.     Objective:  BP 132/84 (BP Location: Left Arm, Patient Position: Sitting, Cuff Size: Large)   Pulse 75   Temp 98.4 F (36.9 C) (Oral)   Ht 5' 5.5" (1.664 m)   Wt (!) 316 lb (143.3 kg)   SpO2 93%   BMI 51.79 kg/m   BP Readings from Last 3 Encounters:  05/09/18 132/84  05/09/18 124/84  11/15/17 140/80    Wt Readings from Last 3 Encounters:  05/09/18 (!) 316 lb (143.3 kg)  05/09/18 (!) 316 lb (143.3 kg)  11/15/17 (!) 304 lb (137.9 kg)    Physical Exam Constitutional:      General:  She is not in acute distress.    Appearance: She is well-developed.  HENT:     Head: Normocephalic.     Right Ear: External ear normal.     Left Ear: External ear normal.     Nose: Nose normal.  Eyes:     General:        Right eye: No discharge.        Left eye: No discharge.     Conjunctiva/sclera: Conjunctivae normal.     Pupils: Pupils are  equal, round, and reactive to light.  Neck:     Musculoskeletal: Normal range of motion and neck supple.     Thyroid: No thyromegaly.     Vascular: No JVD.     Trachea: No tracheal deviation.  Cardiovascular:     Rate and Rhythm: Normal rate and regular rhythm.     Heart sounds: Normal heart sounds.  Pulmonary:     Effort: No respiratory distress.     Breath sounds: No stridor. No wheezing.  Abdominal:     General: Bowel sounds are normal. There is no distension.     Palpations: Abdomen is soft. There is no mass.     Tenderness: There is no abdominal tenderness. There is no guarding or rebound.  Musculoskeletal:        General: No tenderness.  Lymphadenopathy:     Cervical: No cervical adenopathy.  Skin:    Findings: No erythema or rash.  Neurological:     Cranial Nerves: No cranial nerve deficit.     Motor: No abnormal muscle tone.     Coordination: Coordination normal.     Deep Tendon Reflexes: Reflexes normal.  Psychiatric:        Behavior: Behavior normal.        Thought Content: Thought content normal.        Judgment: Judgment normal.   obese B knees hurt w/ROM  Lab Results  Component Value Date   WBC 6.0 12/04/2016   HGB 12.5 12/04/2016   HCT 38.9 12/04/2016   PLT 258.0 12/04/2016   GLUCOSE 103 (H) 12/04/2016   CHOL 200 12/04/2016   TRIG 88.0 12/04/2016   HDL 45.20 12/04/2016   LDLDIRECT 149.3 10/23/2010   LDLCALC 137 (H) 12/04/2016   ALT 18 12/04/2016   AST 14 12/04/2016   NA 142 12/04/2016   K 4.2 12/04/2016   CL 108 12/04/2016   CREATININE 0.76 12/04/2016   BUN 14 12/04/2016   CO2 29 12/04/2016   TSH 0.33 (L) 01/15/2017   HGBA1C 5.5 12/04/2016    Mm 3d Screen Breast Bilateral  Result Date: 11/15/2017 CLINICAL DATA:  Screening. EXAM: DIGITAL SCREENING BILATERAL MAMMOGRAM WITH TOMO AND CAD COMPARISON:  Previous exam(s). ACR Breast Density Category a: The breast tissue is almost entirely fatty. FINDINGS: There are no findings suspicious for  malignancy. Images were processed with CAD. IMPRESSION: No mammographic evidence of malignancy. A result letter of this screening mammogram will be mailed directly to the patient. RECOMMENDATION: Screening mammogram in one year. (Code:SM-B-01Y) BI-RADS CATEGORY  1: Negative. Electronically Signed   By: Ammie Ferrier M.D.   On: 11/15/2017 16:31    Assessment & Plan:   There are no diagnoses linked to this encounter.   No orders of the defined types were placed in this encounter.    Follow-up: No follow-ups on file.  Walker Kehr, MD

## 2018-05-09 NOTE — Patient Instructions (Signed)
[facility] 8 to 18 cm, check download in 1 month

## 2018-05-09 NOTE — Assessment & Plan Note (Signed)
Zoloft

## 2018-05-09 NOTE — Telephone Encounter (Signed)
Patient arrived 45 minutes late to her appointment for her AEX with Dr. Talbert Nan. She is now on the wait list for specific dates and/or will call back to reschedule. Patient is in 02 recall now.

## 2018-05-09 NOTE — Assessment & Plan Note (Signed)
Start Claritin 

## 2018-05-09 NOTE — Assessment & Plan Note (Signed)
Wt Readings from Last 3 Encounters:  05/09/18 (!) 316 lb (143.3 kg)  05/09/18 (!) 316 lb (143.3 kg)  11/15/17 (!) 304 lb (137.9 kg)  low carb diet - discussed

## 2018-05-09 NOTE — Patient Instructions (Addendum)
Stop Doxazosin  Start losartan  Cardiac CT calcium scoring test $150   Computed tomography, more commonly known as a CT or CAT scan, is a diagnostic medical imaging test. Like traditional x-rays, it produces multiple images or pictures of the inside of the body. The cross-sectional images generated during a CT scan can be reformatted in multiple planes. They can even generate three-dimensional images. These images can be viewed on a computer monitor, printed on film or by a 3D printer, or transferred to a CD or DVD. CT images of internal organs, bones, soft tissue and blood vessels provide greater detail than traditional x-rays, particularly of soft tissues and blood vessels. A cardiac CT scan for coronary calcium is a non-invasive way of obtaining information about the presence, location and extent of calcified plaque in the coronary arteries-the vessels that supply oxygen-containing blood to the heart muscle. Calcified plaque results when there is a build-up of fat and other substances under the inner layer of the artery. This material can calcify which signals the presence of atherosclerosis, a disease of the vessel wall, also called coronary artery disease (CAD). People with this disease have an increased risk for heart attacks. In addition, over time, progression of plaque build up (CAD) can narrow the arteries or even close off blood flow to the heart. The result may be chest pain, sometimes called "angina," or a heart attack. Because calcium is a marker of CAD, the amount of calcium detected on a cardiac CT scan is a helpful prognostic tool. The findings on cardiac CT are expressed as a calcium score. Another name for this test is coronary artery calcium scoring.  What are some common uses of the procedure? The goal of cardiac CT scan for calcium scoring is to determine if CAD is present and to what extent, even if there are no symptoms. It is a screening study that may be recommended by a  physician for patients with risk factors for CAD but no clinical symptoms. The major risk factors for CAD are: . high blood cholesterol levels  . family history of heart attacks  . diabetes  . high blood pressure  . cigarette smoking  . overweight or obese  . physical inactivity   A negative cardiac CT scan for calcium scoring shows no calcification within the coronary arteries. This suggests that CAD is absent or so minimal it cannot be seen by this technique. The chance of having a heart attack over the next two to five years is very low under these circumstances. A positive test means that CAD is present, regardless of whether or not the patient is experiencing any symptoms. The amount of calcification-expressed as the calcium score-may help to predict the likelihood of a myocardial infarction (heart attack) in the coming years and helps your medical doctor or cardiologist decide whether the patient may need to take preventive medicine or undertake other measures such as diet and exercise to lower the risk for heart attack. The extent of CAD is graded according to your calcium score:  Calcium Score  Presence of CAD  0 No evidence of CAD   1-10 Minimal evidence of CAD  11-100 Mild evidence of CAD  101-400 Moderate evidence of CAD  Over 400 Extensive evidence of CAD

## 2018-05-09 NOTE — Progress Notes (Signed)
   Subjective:    Patient ID: Tracy Sampson, female    DOB: Jan 19, 1956, 62 y.o.   MRN: [id number]  HPI  62 yo obese woman  for FU of obstructive sleep apnea diagnosed in 08/2001 on autoCPAP.   Chief Complaint  Patient presents with  . Follow-up    1 yr f/u for OSA. Uses [facility] as her DME. States her OSA has been doing ok since her last visit. Is requesting a new machine.     She has gained 30 pounds in the last 4 years She reports compliance with her machine, uses a full facemask and has adjusted well to this.  She wakes up feeling rested.  She is getting her supplies on time. No problems with pressure  Her current machine is more than 63 years old, she got it in 2012 and she wonders if she could get a new machine  Significant tests/ events reviewed   Rpt titration 04/2008- wt 298 lbs >> CPAP 15 cm   Past Medical History:  Diagnosis Date  . Allergic rhinitis   . Allergy   . Arthritis   . Cataract    bilateral removed  . Depression   . GERD (gastroesophageal reflux disease)   . Glaucoma   . Hypertension   . Hypothyroidism   . Morbid obesity ([place])   . OSA (obstructive sleep apnea)    Dr Joya Gaskins  . Sleep apnea    wears cpap  . Thyroid disease   . Urinary incontinence     Review of Systems neg for any significant sore throat, dysphagia, itching, sneezing, nasal congestion or excess/ purulent secretions, fever, chills, sweats, unintended wt loss, pleuritic or exertional cp, hempoptysis, orthopnea pnd or change in chronic leg swelling. Also denies presyncope, palpitations, heartburn, abdominal pain, nausea, vomiting, diarrhea or change in bowel or urinary habits, dysuria,hematuria, rash, arthralgias, visual complaints, headache, numbness weakness or ataxia.      Objective:   Physical Exam  Gen. Pleasant, obese, in no distress ENT - no lesions, no post nasal drip Neck: No JVD, no thyromegaly, no carotid bruits Lungs: no use of accessory muscles, no dullness to  percussion, decreased without rales or rhonchi  Cardiovascular: Rhythm regular, heart sounds  normal, no murmurs or gallops, no peripheral edema Musculoskeletal: No deformities, no cyanosis or clubbing , no tremors       Assessment & Plan:

## 2018-05-09 NOTE — Assessment & Plan Note (Signed)
Weight loss advised  may need increased pressure due to weight gain

## 2018-05-09 NOTE — Assessment & Plan Note (Signed)
D/c cardura due to urinary issues Try Myrbetriq

## 2018-05-09 NOTE — Assessment & Plan Note (Signed)
Oxybutynin ER- did not help D/c Doxazosin  Trial of Myrbetriq

## 2018-05-09 NOTE — Assessment & Plan Note (Signed)
cardiac CT scan for calcium scoring offered  D/c cardura due to urinary issues Added Losartan

## 2018-05-09 NOTE — Assessment & Plan Note (Signed)
Re-start Protonix 

## 2018-05-09 NOTE — Assessment & Plan Note (Signed)
She has gained 30 pounds since her last study and likely needs increase pressure. We will start her on auto CPAP 8 to 18 cm and recheck download in 1 month  Weight loss encouraged, compliance with goal of at least 4-6 hrs every night is the expectation. Advised against medications with sedative side effects Cautioned against driving when sleepy - understanding that sleepiness will vary on a day to day basis

## 2018-05-20 ENCOUNTER — Ambulatory Visit: Payer: Managed Care, Other (non HMO) | Admitting: Internal Medicine

## 2018-06-03 ENCOUNTER — Other Ambulatory Visit: Payer: Self-pay

## 2018-06-04 MED ORDER — SERTRALINE HCL 100 MG PO TABS: 100.0000 mg | ORAL_TABLET | Freq: Every day | ORAL | 3 refills | Status: DC

## 2018-06-06 ENCOUNTER — Telehealth: Payer: Self-pay | Admitting: Pulmonary Disease

## 2018-06-06 DIAGNOSIS — G4733 Obstructive sleep apnea (adult) (pediatric): Secondary | ICD-10-CM

## 2018-06-06 DIAGNOSIS — Z9989 Dependence on other enabling machines and devices: Principal | ICD-10-CM

## 2018-06-06 NOTE — Telephone Encounter (Signed)
Spoke with pt. She is inquiring about her CPAP that was ordered in December 2019. I have left a message with Corene Cornea at [facility] to figure out what's going on. Pt is aware of this.

## 2018-06-07 NOTE — Telephone Encounter (Signed)
Called [place] at [facility] Unable to reach left message to give [place] a call back.

## 2018-06-08 NOTE — Telephone Encounter (Signed)
LMTCB with Tracy Sampson at [facility].

## 2018-06-09 NOTE — Telephone Encounter (Signed)
Called and left detailed message (DPR) for Patient to call back about receiving CPAP from [facility].

## 2018-06-10 NOTE — Telephone Encounter (Signed)
I called and spoke with Sonia Baller she states they are unable to take the titration study the need the original.  I have called [facility] and they have advised that they are able to take care of the patient.  I have placed they order to change companies. I have left a  Message for the patient to keep her informed. Nothing further needed

## 2018-06-10 NOTE — Telephone Encounter (Signed)
She has a titration study from 2009. If this is not acceptable to [facility], then please send to different DME

## 2018-06-10 NOTE — Telephone Encounter (Signed)
Patient was seen by Dr. Elsworth Soho, 05/09/18, and CPAP order was placed with [facility]. Patient has not been contacted about CPAP. Called and spoke with [place], [facility]. Sonia Baller stated that there is no  sleep study in epic.  They do have a sleep study from 2003 and a titration.   Dr. Elsworth Soho, please advise on sleep study

## 2018-06-14 ENCOUNTER — Telehealth: Payer: Self-pay | Admitting: Pulmonary Disease

## 2018-06-14 NOTE — Telephone Encounter (Signed)
Received a staff message from [facility] stating that the patient's current sleep study dates back to 2003. She last had a CPAP titration back in 2009. Patient will need a HST in order to have insurance cover her RX.   Spoke with patient, she is not happy with this information. The reason she was seen by [facility] in December was because she had met her deductible and her insurance, Christella Scheuermann would pay 100%. Now that it is 2020, she will have to pay for this out of pocket.  Advised patient that I would call her insurance company directly to see if they are requiring another sleep study or if it just the DME companies. She verbalized understanding and appreciation.   Filiberto Pinks at [phone] and spoke with Lovena Le. She stated that the patient DID NOT need another sleep study in order for insurance to continue to pay for her cpap and cpap supplies. She requested that I send documentation to their claims dept to prove that [facility] did not process the order until after 05/18/2018. Will fax this information to [place] at [phone].   Spoke with patient, she is aware of the above information. Will go ahead and print out the order that was placed on 12/23 as well as the phone call from patient stating that she hadn't heard from [facility]. She verbalized understanding.   Will keep this note open for follow up.

## 2018-06-17 NOTE — Telephone Encounter (Signed)
Cherina can you provide an update on this?

## 2018-06-17 NOTE — Telephone Encounter (Signed)
Information has been faxed to [facility], just waiting on their response. Will check with [facility] today to see if they will be able to help patient without the need of a new sleep study.

## 2018-06-17 NOTE — Telephone Encounter (Signed)
Left message for [facility] to see if they will be able to help patient.

## 2018-06-21 NOTE — Telephone Encounter (Signed)
Tracy Sampson can you provide an update on this?

## 2018-06-22 NOTE — Telephone Encounter (Signed)
Per Jamesetta Geralds has found the SS and is sending it to [facility]. Will call the patient later today to let her know.

## 2018-06-23 NOTE — Telephone Encounter (Signed)
Spoke with pt. She is aware that we found her sleep study and sent it to [facility]. Nothing further was needed.

## 2018-07-01 ENCOUNTER — Ambulatory Visit: Payer: Self-pay

## 2018-07-01 NOTE — Telephone Encounter (Signed)
Patient called and says she was talking to a guy at work who teaches at [employer] about having no energy and feeling bloated. He suggested a drink, Celsius, which she says she went to the health food store and bought a can. She says the person checking her out advised her to talk with her doctor to make sure it's ok to drink. The patient says she drank it and had energy enough to clean her house and she let out a lot of air and her stomach went dow and is not bloated anymore. I asked what is the drinks, she says a sparkling water that burns fat and increases metabolism. She says the can says no high fructose, no aspartame, no preservatives. She wants to know if this is ok to drink being a diabetic and would like Dr. Alain Marion to answer the question. I advised the office is closed and she will receive a call on Monday with his recommendation. She says to leave a message on her machine, if she doesn't answer.  Reason for Disposition . Caller has medication question about med not prescribed by PCP and triager unable to answer question (e.g., compatibility with other med, storage)  Protocols used: MEDICATION QUESTION CALL-A-AH

## 2018-07-01 NOTE — Telephone Encounter (Signed)
Patient called, left VM to return call to the office. 

## 2018-07-04 NOTE — Telephone Encounter (Signed)
Routing to dr plotnikov, please advise, I will call patient back, thanks 

## 2018-07-04 NOTE — Telephone Encounter (Signed)
It is okay to drink, assuming there is no side effects like palpitations dizziness lightheadedness etc. Thank you

## 2018-07-05 NOTE — Telephone Encounter (Signed)
Patient advised of celsius ingredients according to this drinks website/info---also advised of dr plotnikovs note/instructions

## 2018-07-07 ENCOUNTER — Telehealth: Payer: Self-pay | Admitting: Adult Health

## 2018-07-07 NOTE — Telephone Encounter (Signed)
Spoke with Sonia Baller at [facility]. She stated that [facility] has yet to receive the diagnostic SS that Rodena Piety was able to find and fax to them earlier this month. As soon as she receives that document, she will be able to process the order.   Spoke with Rodena Piety and [place], the [facility] has been faxed numerous times. Rodena Piety sent the original to be scanned into patient's chart.   Spoke with Sonia Baller again, advised her that the document was sent to scan yesterday and it could take 2-3 weeks before it is uploaded to her chart. She stated that she will keep checking patient's file.   Left a message for the patient to call back for updates.

## 2018-07-08 ENCOUNTER — Other Ambulatory Visit: Payer: Self-pay | Admitting: Internal Medicine

## 2018-07-08 NOTE — Telephone Encounter (Signed)
lmtcb for pt.  

## 2018-07-08 NOTE — Telephone Encounter (Signed)
Pt returning call CB# [phone]

## 2018-07-11 ENCOUNTER — Ambulatory Visit: Payer: Managed Care, Other (non HMO) | Admitting: Adult Health

## 2018-07-11 NOTE — Telephone Encounter (Signed)
Left message for patient to call back  

## 2018-07-12 ENCOUNTER — Telehealth: Payer: Self-pay

## 2018-07-12 MED ORDER — LEVOTHYROXINE SODIUM 150 MCG PO TABS: 150.0000 ug | ORAL_TABLET | Freq: Every day | ORAL | 1 refills | Status: DC

## 2018-07-12 NOTE — Telephone Encounter (Signed)
Copied from [place] [id number]. Topic: Quick Communication - Rx Refill/Question >> Jul 11, 2018  2:19 PM Carolyn Stare wrote: Medication: levothyroxine (SYNTHROID, LEVOTHROID) 150 MCG tablet     pt said her insurance covers 90 day supply and is asking for the 90 day supply to be called in   30 day supply was called in on 07/08/2018   [facility]   Agent: Please be advised that RX refills may take up to 3 business days. We ask that you follow-up with your pharmacy.

## 2018-07-12 NOTE — Telephone Encounter (Signed)
Called and spoke with Tracy Sampson herself. Advised patient of the message stated below from [place]. Patient stated that she will wait until she hears back from either [place] or [facility]. Patient verbalized information understood back to me. Patient stated that there were no other questions or concerns. Called [facility]. Was placed on a wait for over 7 minutes. Will call back.        Nothing further needed at this moment. Will wait to hear back from either patient or [facility].

## 2018-07-13 NOTE — Telephone Encounter (Signed)
Still currently waiting on [facility] to receive sleep study.

## 2018-07-14 NOTE — Telephone Encounter (Signed)
Called [facility] and office is currently closed. Will call back.

## 2018-07-15 NOTE — Telephone Encounter (Signed)
Called and spoke with [place], [facility]  Sleep study was sent to scan,and she is waiting for it to show.  She stated that she is aware that it may take 1-2 weeks. She stated that she has a note for reminder to check daily.

## 2018-07-18 NOTE — Telephone Encounter (Signed)
Sleep study not showing in media at that time. Will continue to check.

## 2018-07-19 NOTE — Telephone Encounter (Signed)
LMOM for [facility] to call back.   Patrice Do you have a copy of this patient sleep study ?

## 2018-07-20 NOTE — Telephone Encounter (Signed)
Sleep study has been sent to scan. Sonia Baller from [facility] is aware that the sleep study can take 1-2 weeks before it will be scanned into the chart. It has been almost 2 weeks since being sent to scan. Called Sonia Baller with [facility]. Office is currently closed. Will call back.

## 2018-07-21 ENCOUNTER — Telehealth: Payer: Self-pay | Admitting: Pulmonary Disease

## 2018-07-21 NOTE — Telephone Encounter (Signed)
SS has been printed and faxed to [facility] attention.

## 2018-07-21 NOTE — Telephone Encounter (Signed)
No, I don't have the sleep study to scan as of yet.- Thanks - pr

## 2018-07-21 NOTE — Telephone Encounter (Signed)
ATC Tracy Sampson at [place] , there is no longer an option to dial an extension. Was on hold for 10 minutes. Only use her cell phone number at this time.   Spoke with Tracy Sampson and she said all she is received is titration studies.  It was actually the 05/01/2001 sleep study she was looking for. Tracy Sampson said she has it and there is nothing else we need to send to her. Document was scanned on 07/12/18.  Nothing further needed.

## 2018-08-08 ENCOUNTER — Telehealth: Payer: Self-pay | Admitting: Internal Medicine

## 2018-08-08 NOTE — Telephone Encounter (Signed)
Patient called and says she's returning a call. I advised nothing is documented that a call was made to her. She says she called a number back and left a message on the recording that she's cancelling her appointment for today. I advised she has an appointment for tomorrow, she says she will need to cancel and call back with another appointment once all this virus stuff goes away. I asked does she have enough medications to last, she says yes. Appointment cancelled.

## 2018-08-09 ENCOUNTER — Ambulatory Visit: Payer: Managed Care, Other (non HMO) | Admitting: Internal Medicine

## 2018-09-23 ENCOUNTER — Telehealth: Payer: Self-pay | Admitting: Internal Medicine

## 2018-09-23 ENCOUNTER — Other Ambulatory Visit: Payer: Self-pay | Admitting: Internal Medicine

## 2018-09-23 NOTE — Telephone Encounter (Signed)
Copied from [place] [id number]. Topic: Quick Communication - Rx Refill/Question >> Sep 23, 2018 11:58 AM Blase Mess A wrote: Medication: sertraline (ZOLOFT) 100 MG tablet [[id number]]   Has the patient contacted their pharmacy? yes (Agent: If no, request that the patient contact the pharmacy for the refill.) (Agent: If yes, when and what did the pharmacy advise?)  Preferred Pharmacy (with phone number or street name):  [id number] - [place], [place] - [address] AT [place] [phone] (Phone) [phone] (Fax)     Agent: Please be advised that RX refills may take up to 3 business days. We ask that you follow-up with your pharmacy.

## 2018-09-23 NOTE — Telephone Encounter (Signed)
Looks like a year's worth was sent in Jan 2020

## 2018-09-23 NOTE — Telephone Encounter (Signed)
Spoke with patient today. 

## 2018-10-21 ENCOUNTER — Other Ambulatory Visit: Payer: Self-pay | Admitting: Internal Medicine

## 2018-10-25 ENCOUNTER — Other Ambulatory Visit: Payer: Self-pay | Admitting: Internal Medicine

## 2018-10-27 ENCOUNTER — Encounter: Payer: Self-pay | Admitting: Internal Medicine

## 2018-10-27 ENCOUNTER — Other Ambulatory Visit (INDEPENDENT_AMBULATORY_CARE_PROVIDER_SITE_OTHER): Payer: Managed Care, Other (non HMO)

## 2018-10-27 ENCOUNTER — Ambulatory Visit (INDEPENDENT_AMBULATORY_CARE_PROVIDER_SITE_OTHER): Payer: Managed Care, Other (non HMO) | Admitting: Internal Medicine

## 2018-10-27 ENCOUNTER — Other Ambulatory Visit: Payer: Self-pay

## 2018-10-27 VITALS — BP 130/84 | HR 84 | Temp 98.3°F | Ht 65.5 in | Wt 321.0 lb

## 2018-10-27 DIAGNOSIS — E034 Atrophy of thyroid (acquired): Secondary | ICD-10-CM | POA: Diagnosis not present

## 2018-10-27 DIAGNOSIS — G4733 Obstructive sleep apnea (adult) (pediatric): Secondary | ICD-10-CM

## 2018-10-27 DIAGNOSIS — I10 Essential (primary) hypertension: Secondary | ICD-10-CM

## 2018-10-27 DIAGNOSIS — Z9989 Dependence on other enabling machines and devices: Secondary | ICD-10-CM

## 2018-10-27 DIAGNOSIS — E559 Vitamin D deficiency, unspecified: Secondary | ICD-10-CM | POA: Diagnosis not present

## 2018-10-27 LAB — URINALYSIS
Bilirubin Urine: NEGATIVE
Ketones, ur: NEGATIVE
Leukocytes,Ua: NEGATIVE
Nitrite: NEGATIVE
Specific Gravity, Urine: >=1.03 — AB (ref 1.000–1.030)
Total Protein, Urine: NEGATIVE
Urine Glucose: NEGATIVE
Urobilinogen, UA: 0.2 (ref 0.0–1.0)
pH: 5.5 (ref 5.0–8.0)

## 2018-10-27 LAB — LIPID PANEL
Cholesterol: 191 mg/dL (ref 0–200)
HDL: 40.7 mg/dL (ref >39.00)
LDL Cholesterol: 128 mg/dL — ABNORMAL HIGH (ref 0–99)
NonHDL: 149.99
Total CHOL/HDL Ratio: 5
Triglycerides: 109 mg/dL (ref 0.0–149.0)
VLDL: 21.8 mg/dL (ref 0.0–40.0)

## 2018-10-27 LAB — CBC WITH DIFFERENTIAL/PLATELET
Basophils Absolute: 0 10*3/uL (ref 0.0–0.1)
Basophils Relative: 0.5 % (ref 0.0–3.0)
Eosinophils Absolute: 0.2 10*3/uL (ref 0.0–0.7)
Eosinophils Relative: 2.9 % (ref 0.0–5.0)
HCT: 39.4 % (ref 36.0–46.0)
Hemoglobin: 12.7 g/dL (ref 12.0–15.0)
Lymphocytes Relative: 34.6 % (ref 12.0–46.0)
Lymphs Abs: 2.4 10*3/uL (ref 0.7–4.0)
MCHC: 32.4 g/dL (ref 30.0–36.0)
MCV: 92.9 fl (ref 78.0–100.0)
Monocytes Absolute: 0.5 10*3/uL (ref 0.1–1.0)
Monocytes Relative: 6.8 % (ref 3.0–12.0)
Neutro Abs: 3.9 10*3/uL (ref 1.4–7.7)
Neutrophils Relative %: 55.2 % (ref 43.0–77.0)
Platelets: 259 10*3/uL (ref 150.0–400.0)
RBC: 4.24 Mil/uL (ref 3.87–5.11)
RDW: 13.8 % (ref 11.5–15.5)
WBC: 7.1 10*3/uL (ref 4.0–10.5)

## 2018-10-27 LAB — BASIC METABOLIC PANEL
BUN: 14 mg/dL (ref 6–23)
CO2: 25 mEq/L (ref 19–32)
Calcium: 9.5 mg/dL (ref 8.4–10.5)
Chloride: 108 mEq/L (ref 96–112)
Creatinine, Ser: 0.79 mg/dL (ref 0.40–1.20)
GFR: 88.71 mL/min (ref >60.00)
Glucose, Bld: 108 mg/dL — ABNORMAL HIGH (ref 70–99)
Potassium: 3.7 mEq/L (ref 3.5–5.1)
Sodium: 144 mEq/L (ref 135–145)

## 2018-10-27 LAB — HEPATIC FUNCTION PANEL
ALT: 21 U/L (ref 0–35)
AST: 19 U/L (ref 0–37)
Albumin: 3.9 g/dL (ref 3.5–5.2)
Alkaline Phosphatase: 78 U/L (ref 39–117)
Bilirubin, Direct: 0 mg/dL (ref 0.0–0.3)
Total Bilirubin: 0.4 mg/dL (ref 0.2–1.2)
Total Protein: 7.2 g/dL (ref 6.0–8.3)

## 2018-10-27 LAB — TSH: TSH: 1.39 u[IU]/mL (ref 0.35–4.50)

## 2018-10-27 NOTE — Assessment & Plan Note (Signed)
CPAP.  

## 2018-10-27 NOTE — Progress Notes (Signed)
Subjective:  Patient ID: Tracy Sampson, female    DOB: 1955/06/26  Age: 63 y.o. MRN: [id number]  CC: No chief complaint on file.   HPI Tracy Sampson presents for HTN, OA, obesity  Outpatient Medications Prior to Visit  Medication Sig Dispense Refill  . ALPHAGAN P 0.1 % SOLN Place 1 drop into both eyes 2 (two) times daily.  0  . amLODipine (NORVASC) 5 MG tablet Take 1 tablet (5 mg total) by mouth daily. Keep 10/27/18 appointment. 90 tablet 0  . dorzolamide-timolol (COSOPT) 22.3-6.8 MG/ML ophthalmic solution     . ibuprofen (ADVIL,MOTRIN) 600 MG tablet Take 1 tablet (600 mg total) by mouth every 8 (eight) hours as needed. 30 tablet 1  . levothyroxine (SYNTHROID, LEVOTHROID) 150 MCG tablet Take 1 tablet (150 mcg total) by mouth daily before breakfast. 90 tablet 1  . loratadine (CLARITIN) 10 MG tablet Take 1 tablet (10 mg total) by mouth daily. 30 tablet 11  . losartan (COZAAR) 100 MG tablet Take 1 tablet (100 mg total) by mouth daily. 30 tablet 11  . mirabegron ER (MYRBETRIQ) 50 MG TB24 tablet Take 1 tablet (50 mg total) by mouth daily. 30 tablet 11  . oxybutynin (DITROPAN XL) 10 MG 24 hr tablet Take 1 tablet (10 mg total) by mouth at bedtime. 30 tablet 11  . pantoprazole (PROTONIX) 40 MG tablet Take 1 tablet (40 mg total) by mouth daily. 30 tablet 11  . sertraline (ZOLOFT) 100 MG tablet Take 1 tablet (100 mg total) by mouth daily. 90 tablet 3  . vitamin B-12 (CYANOCOBALAMIN) 500 MCG tablet Take 500 mcg by mouth daily.     No facility-administered medications prior to visit.     ROS: Review of Systems  Constitutional: Positive for fatigue and unexpected weight change. Negative for activity change, appetite change and chills.  HENT: Negative for congestion, mouth sores and sinus pressure.   Eyes: Negative for visual disturbance.  Respiratory: Negative for cough and chest tightness.   Gastrointestinal: Negative for abdominal pain and nausea.  Genitourinary: Negative for  difficulty urinating, frequency and vaginal pain.  Musculoskeletal: Positive for arthralgias. Negative for back pain and gait problem.  Skin: Negative for pallor and rash.  Neurological: Negative for dizziness, tremors, weakness, numbness and headaches.  Psychiatric/Behavioral: Negative for confusion and sleep disturbance.    Objective:  BP 130/84 (BP Location: Left Arm, Patient Position: Sitting, Cuff Size: Large)   Pulse 84   Temp 98.3 F (36.8 C) (Oral)   Ht 5' 5.5" (1.664 m)   Wt (!) 321 lb (145.6 kg)   SpO2 95%   BMI 52.60 kg/m   BP Readings from Last 3 Encounters:  10/27/18 130/84  05/09/18 132/84  05/09/18 124/84    Wt Readings from Last 3 Encounters:  10/27/18 (!) 321 lb (145.6 kg)  05/09/18 (!) 316 lb (143.3 kg)  05/09/18 (!) 316 lb (143.3 kg)    Physical Exam Constitutional:      General: She is not in acute distress.    Appearance: She is well-developed.  HENT:     Head: Normocephalic.     Right Ear: External ear normal.     Left Ear: External ear normal.     Nose: Nose normal.  Eyes:     General:        Right eye: No discharge.        Left eye: No discharge.     Conjunctiva/sclera: Conjunctivae normal.     Pupils: Pupils are equal,  round, and reactive to light.  Neck:     Musculoskeletal: Normal range of motion and neck supple.     Thyroid: No thyromegaly.     Vascular: No JVD.     Trachea: No tracheal deviation.  Cardiovascular:     Rate and Rhythm: Normal rate and regular rhythm.     Heart sounds: Normal heart sounds.  Pulmonary:     Effort: No respiratory distress.     Breath sounds: No stridor. No wheezing.  Abdominal:     General: Bowel sounds are normal. There is no distension.     Palpations: Abdomen is soft. There is no mass.     Tenderness: There is no abdominal tenderness. There is no guarding or rebound.  Musculoskeletal:        General: No tenderness.  Lymphadenopathy:     Cervical: No cervical adenopathy.  Skin:    Findings: No  erythema or rash.  Neurological:     Mental Status: She is oriented to person, place, and time.     Cranial Nerves: No cranial nerve deficit.     Motor: No abnormal muscle tone.     Coordination: Coordination normal.     Deep Tendon Reflexes: Reflexes normal.  Psychiatric:        Behavior: Behavior normal.        Thought Content: Thought content normal.        Judgment: Judgment normal.   obese  Lab Results  Component Value Date   WBC 7.3 05/09/2018   HGB 12.9 05/09/2018   HCT 39.7 05/09/2018   PLT 218.0 05/09/2018   GLUCOSE 94 05/09/2018   CHOL 195 05/09/2018   TRIG 111.0 05/09/2018   HDL 45.00 05/09/2018   LDLDIRECT 149.3 10/23/2010   LDLCALC 127 (H) 05/09/2018   ALT 19 05/09/2018   AST 17 05/09/2018   NA 144 05/09/2018   K 3.8 05/09/2018   CL 106 05/09/2018   CREATININE 0.74 05/09/2018   BUN 11 05/09/2018   CO2 31 05/09/2018   TSH 0.91 05/09/2018   HGBA1C 5.7 05/09/2018    Mm 3d Screen Breast Bilateral  Result Date: 11/15/2017 CLINICAL DATA:  Screening. EXAM: DIGITAL SCREENING BILATERAL MAMMOGRAM WITH TOMO AND CAD COMPARISON:  Previous exam(s). ACR Breast Density Category a: The breast tissue is almost entirely fatty. FINDINGS: There are no findings suspicious for malignancy. Images were processed with CAD. IMPRESSION: No mammographic evidence of malignancy. A result letter of this screening mammogram will be mailed directly to the patient. RECOMMENDATION: Screening mammogram in one year. (Code:SM-B-01Y) BI-RADS CATEGORY  1: Negative. Electronically Signed   By: Ammie Ferrier M.D.   On: 11/15/2017 16:31    Assessment & Plan:   There are no diagnoses linked to this encounter.   No orders of the defined types were placed in this encounter.    Follow-up: No follow-ups on file.  Walker Kehr, MD

## 2018-10-27 NOTE — Assessment & Plan Note (Signed)
Levoxyl 

## 2018-10-27 NOTE — Assessment & Plan Note (Signed)
Vit D 

## 2018-10-27 NOTE — Assessment & Plan Note (Signed)
Amlodipine.

## 2018-10-27 NOTE — Assessment & Plan Note (Signed)
Wt Readings from Last 3 Encounters:  10/27/18 (!) 321 lb (145.6 kg)  05/09/18 (!) 316 lb (143.3 kg)  05/09/18 (!) 316 lb (143.3 kg)

## 2018-10-31 ENCOUNTER — Other Ambulatory Visit (HOSPITAL_BASED_OUTPATIENT_CLINIC_OR_DEPARTMENT_OTHER): Payer: Self-pay | Admitting: Internal Medicine

## 2018-10-31 DIAGNOSIS — Z1231 Encounter for screening mammogram for malignant neoplasm of breast: Secondary | ICD-10-CM

## 2018-11-17 ENCOUNTER — Ambulatory Visit (HOSPITAL_BASED_OUTPATIENT_CLINIC_OR_DEPARTMENT_OTHER)
Admission: RE | Admit: 2018-11-17 | Discharge: 2018-11-17 | Disposition: A | Discharge status: Home / Self Care | Payer: Managed Care, Other (non HMO) | Source: Ambulatory Visit | Attending: Internal Medicine | Admitting: Internal Medicine

## 2018-11-17 ENCOUNTER — Encounter (HOSPITAL_BASED_OUTPATIENT_CLINIC_OR_DEPARTMENT_OTHER): Payer: Self-pay

## 2018-11-17 ENCOUNTER — Other Ambulatory Visit: Payer: Self-pay

## 2018-11-17 DIAGNOSIS — Z1231 Encounter for screening mammogram for malignant neoplasm of breast: Secondary | ICD-10-CM | POA: Insufficient documentation

## 2019-01-04 ENCOUNTER — Other Ambulatory Visit: Payer: Self-pay | Admitting: Internal Medicine

## 2019-01-04 ENCOUNTER — Other Ambulatory Visit: Payer: Self-pay

## 2019-01-05 NOTE — Progress Notes (Deleted)
63 y.o. G0P0000 Single Black or African American Not Hispanic or Latino female here for annual exam.      No LMP recorded. Patient has had a hysterectomy.          Sexually active: {yes no:314532}  The current method of family planning is {contraception:315051}.    Exercising: {yes no:314532}  {types:19826} Smoker:  {YES P5382123  Health Maintenance: Pap:  11/16/2016 WNL NEG HPV, 07-11-14 WNL NEG HR HPV History of abnormal Pap:  no MMG:  11/20/2018 Birads 1 negative Colonoscopy:  07-12-15 polyps repeat in 5 year BMD:   Never TDaP:  12-04-14  Gardasil: NA    reports that she has never smoked. She has never used smokeless tobacco. She reports that she does not drink alcohol or use drugs.  Past Medical History:  Diagnosis Date  . Allergic rhinitis   . Allergy   . Arthritis   . Cataract    bilateral removed  . Depression   . GERD (gastroesophageal reflux disease)   . Glaucoma   . Hypertension   . Hypothyroidism   . Morbid obesity ([place])   . OSA (obstructive sleep apnea)    Dr Joya Gaskins  . Sleep apnea    wears cpap  . Thyroid disease   . Urinary incontinence     Past Surgical History:  Procedure Laterality Date  . abdominal supracervical hysterectomy with bilateral salpingo-oophorectomy     fibroid uterus  . ANKLE SURGERY  4/10   rt  . BREAST REDUCTION SURGERY    . CATARACT EXTRACTION    . COLONOSCOPY    . HERNIA REPAIR  4132   umbillical  . INSERTION OF MESH N/A 01/07/2016   Procedure: INSERTION OF MESH;  Surgeon: Coralie Keens, MD;  Location: [facility];  Service: General;  Laterality: N/A;  . REDUCTION MAMMAPLASTY    . VENTRAL HERNIA REPAIR N/A 01/07/2016   Procedure: VENTRAL HERNIA REPAIR;  Surgeon: Coralie Keens, MD;  Location: [facility];  Service: General;  Laterality: N/A;    Current Outpatient Medications  Medication Sig Dispense Refill  . ALPHAGAN P 0.1 % SOLN Place 1 drop into both eyes 2 (two) times daily.  0  . amLODipine (NORVASC) 5 MG tablet Take 1 tablet  (5 mg total) by mouth daily. Keep 10/27/18 appointment. 90 tablet 0  . dorzolamide-timolol (COSOPT) 22.3-6.8 MG/ML ophthalmic solution     . ibuprofen (ADVIL,MOTRIN) 600 MG tablet Take 1 tablet (600 mg total) by mouth every 8 (eight) hours as needed. 30 tablet 1  . levothyroxine (SYNTHROID) 150 MCG tablet TAKE 1 TABLET(150 MCG) BY MOUTH DAILY BEFORE BREAKFAST 90 tablet 1  . loratadine (CLARITIN) 10 MG tablet Take 1 tablet (10 mg total) by mouth daily. 30 tablet 11  . losartan (COZAAR) 100 MG tablet Take 1 tablet (100 mg total) by mouth daily. 30 tablet 11  . mirabegron ER (MYRBETRIQ) 50 MG TB24 tablet Take 1 tablet (50 mg total) by mouth daily. 30 tablet 11  . oxybutynin (DITROPAN XL) 10 MG 24 hr tablet Take 1 tablet (10 mg total) by mouth at bedtime. 30 tablet 11  . pantoprazole (PROTONIX) 40 MG tablet Take 1 tablet (40 mg total) by mouth daily. 30 tablet 11  . sertraline (ZOLOFT) 100 MG tablet Take 1 tablet (100 mg total) by mouth daily. 90 tablet 3  . vitamin B-12 (CYANOCOBALAMIN) 500 MCG tablet Take 500 mcg by mouth daily.     No current facility-administered medications for this visit.     Family  History  Problem Relation Age of Onset  . Breast cancer Mother   . Cancer Mother        breast  . Hypertension Mother   . Colon cancer Maternal Grandmother   . Cancer Maternal Grandmother        colon    Review of Systems  Exam:   There were no vitals taken for this visit.  Weight change: @WEIGHTCHANGE @ Height:      Ht Readings from Last 3 Encounters:  10/27/18 5' 5.5" (1.664 m)  05/09/18 5' 5.5" (1.664 m)  05/09/18 5' 5.5" (1.664 m)    General appearance: alert, cooperative and appears stated age Head: Normocephalic, without obvious abnormality, atraumatic Neck: no adenopathy, supple, symmetrical, trachea midline and thyroid {CHL AMB PHY EX THYROID NORM [phone]::"normal to inspection and palpation"} Lungs: clear to auscultation bilaterally Cardiovascular: regular rate  and rhythm Breasts: {Exam; breast:13139::"normal appearance, no masses or tenderness"} Abdomen: soft, non-tender; non distended,  no masses,  no organomegaly Extremities: extremities normal, atraumatic, no cyanosis or edema Skin: Skin color, texture, turgor normal. No rashes or lesions Lymph nodes: Cervical, supraclavicular, and axillary nodes normal. No abnormal inguinal nodes palpated Neurologic: Grossly normal   Pelvic: External genitalia:  no lesions              Urethra:  normal appearing urethra with no masses, tenderness or lesions              Bartholins and Skenes: normal                 Vagina: normal appearing vagina with normal color and discharge, no lesions              Cervix: {CHL AMB PHY EX CERVIX NORM DEFAULT:[id number]::"no lesions"}               Bimanual Exam:  Uterus:  {CHL AMB PHY EX UTERUS NORM [phone]::"normal size, contour, position, consistency, mobility, non-tender"}              Adnexa: {CHL AMB PHY EX ADNEXA NO MASS [phone]::"no mass, fullness, tenderness"}               Rectovaginal: Confirms               Anus:  normal sphincter tone, no lesions  Chaperone was present for exam.  A:  Well Woman with normal exam  P:

## 2019-01-06 ENCOUNTER — Encounter: Payer: Self-pay | Admitting: Obstetrics and Gynecology

## 2019-01-06 ENCOUNTER — Ambulatory Visit: Payer: Managed Care, Other (non HMO) | Admitting: Obstetrics and Gynecology

## 2019-01-09 ENCOUNTER — Telehealth: Payer: Self-pay | Admitting: Obstetrics and Gynecology

## 2019-01-09 NOTE — Telephone Encounter (Signed)
Message left to return call to Triage Nurse at [phone].    

## 2019-01-09 NOTE — Telephone Encounter (Signed)
Patient is having yeast symptoms. Last seen 11/16/16.

## 2019-01-10 NOTE — Telephone Encounter (Signed)
Spoke with patient. Patient reports burning in vagina and odor for the past month. Denies vaginal d/c, bleeding, itching or urinary symptoms. Has not tried anything OTC. Recommended OV for further evaluation. Offered patient OV on 8/27 at 1:30 and 3:30pm, 8/31 at 9:30am, patient declined for now. Patient to review schedule with employer and return call to schedule.

## 2019-01-17 NOTE — Telephone Encounter (Signed)
Left message to call Raoul Ciano, RN at [facility] [phone].   

## 2019-01-18 NOTE — Telephone Encounter (Signed)
Message left to return call to Triage Nurse at [phone].    

## 2019-01-18 NOTE — Telephone Encounter (Signed)
Patient returned call

## 2019-01-18 NOTE — Telephone Encounter (Signed)
Spoke with patient regarding concerns over previously missed appointment and her experience. Advised will review with practice administrator and call her back.

## 2019-01-19 NOTE — Telephone Encounter (Signed)
Return call to patient. Cancellation fee waived and patient aware this is a one time occurrence. Office visit scheduled for 01-27-19 at 37 with Dr Talbert Nan for vaginal irritation. Offered multiple other appointment options, including alternate provider. Patient declined. Encouraged to call back if symptoms worsen.    Encounter closed.

## 2019-01-19 NOTE — Telephone Encounter (Signed)
Patient is returning a call to [place]. Seth Bake was not available).

## 2019-01-19 NOTE — Telephone Encounter (Signed)
Call to patient. Left message to call back to Gay Filler or Seth Bake.

## 2019-01-23 ENCOUNTER — Other Ambulatory Visit: Payer: Self-pay | Admitting: Internal Medicine

## 2019-01-26 ENCOUNTER — Other Ambulatory Visit: Payer: Self-pay

## 2019-01-26 NOTE — Progress Notes (Signed)
GYNECOLOGY  VISIT   HPI: 63 y.o.   Single Black or African American Not Hispanic or Latino  female   G0P0000 with No LMP recorded. Patient has had a hysterectomy.  H/O supracervical hysterectomy.  here for possible infection; patient states that she has been using Monistat x1 weeks and symptoms have gotten better. Per patient, has burning, odor, urinary frequency, incontinence.   Burning is with urination at the end of her stream occurring with every void for the last week. Slightly better. No change in her baseline increase in frequency and urgency to void.  Her incontinence is bothersome.  She has nocutria x 4-5, as soon as she gets up to void she leaks, wets the bed when she is asleep. She voids a large amount a day. She is on ditropan and mrybetriq.  She isn't on diuretics.  She has a 10 oz cup of coffee q am. Drinks some mountain dew (once in a while), has some tea. During the day she voids frequently to avoid leaking.  She has some GSI, not as bothersome (seldom).  Feels she is emptying her bladder okay.  Occasionally feels like her BM doesn't all come out.   She notices occasional BRB with BM when she strains. No pain. Last colonoscopy was at 18. This has been intermittent since prior to her colonoscopy.  She thinks she has a vaginal odor.   She has a known cervical stump prolapse, denies a bulge.    Urine: trace RBC, dark yellow, cloudy  With covid she is working from home.   GYNECOLOGIC HISTORY: No LMP recorded. Patient has had a hysterectomy. Contraception:Hysterectomy Menopausal hormone therapy: none        OB History    Gravida  0   Para  0   Term  0   Preterm  0   AB  0   Living  0     SAB  0   TAB  0   Ectopic  0   Multiple  0   Live Births                 Patient Active Problem List   Diagnosis Date Noted  . Shoulder pain, right 11/15/2017  . OAB (overactive bladder) 11/15/2017  . Contact dermatitis 07/07/2017  . Head congestion  07/07/2017  . Subconjunctival hemorrhage of right eye 12/04/2016  . Incarcerated ventral hernia 01/07/2016  . Left ear pain 06/06/2015  . Rectal bleeding 02/06/2015  . Blood in stool 12/04/2014  . Body odor 12/04/2014  . Umbilical hernia [id number]  . Incontinence in female 08/16/2013  . Trigger finger, acquired 08/16/2013  . Well adult exam 01/11/2012  . Nocturia 02/27/2011  . Paresthesia 10/23/2010  . Ventral hernia 03/01/2009  . Vitamin D deficiency 08/28/2008  . KNEE PAIN 08/28/2008  . OBESITY, MORBID 09/23/2007  . RASH AND OTHER NONSPECIFIC SKIN ERUPTION 09/07/2007  . Hypothyroidism 05/10/2007  . Depression 05/03/2007  . Essential hypertension 05/03/2007  . Allergic rhinitis 05/03/2007  . G E R D 05/03/2007  . OSA on CPAP 05/03/2007  . SUBMUCOUS LEIOMYOMA OF UTERUS 05/02/2007  . GOITER, MULTINODULAR 05/02/2007  . DYSPHAGIA UNSPECIFIED 05/02/2007    Past Medical History:  Diagnosis Date  . Allergic rhinitis   . Allergy   . Arthritis   . Cataract    bilateral removed  . Depression   . GERD (gastroesophageal reflux disease)   . Glaucoma   . Hypertension   . Hypothyroidism   . Morbid obesity (  [facility])   . OSA (obstructive sleep apnea)    Dr Joya Gaskins  . Sleep apnea    wears cpap  . Thyroid disease   . Urinary incontinence     Past Surgical History:  Procedure Laterality Date  . abdominal supracervical hysterectomy with bilateral salpingo-oophorectomy     fibroid uterus  . ANKLE SURGERY  4/10   rt  . BREAST REDUCTION SURGERY    . CATARACT EXTRACTION    . COLONOSCOPY    . HERNIA REPAIR  0000000   umbillical  . INSERTION OF MESH N/A 01/07/2016   Procedure: INSERTION OF MESH;  Surgeon: Coralie Keens, MD;  Location: [facility];  Service: General;  Laterality: N/A;  . REDUCTION MAMMAPLASTY    . VENTRAL HERNIA REPAIR N/A 01/07/2016   Procedure: VENTRAL HERNIA REPAIR;  Surgeon: Coralie Keens, MD;  Location: [facility];  Service: General;  Laterality: N/A;    Current  Outpatient Medications  Medication Sig Dispense Refill  . ALPHAGAN P 0.1 % SOLN Place 1 drop into both eyes 2 (two) times daily.  0  . amLODipine (NORVASC) 5 MG tablet Take 1 tablet (5 mg total) by mouth daily. 90 tablet 0  . dorzolamide-timolol (COSOPT) 22.3-6.8 MG/ML ophthalmic solution     . ibuprofen (ADVIL,MOTRIN) 600 MG tablet Take 1 tablet (600 mg total) by mouth every 8 (eight) hours as needed. 30 tablet 1  . levothyroxine (SYNTHROID) 150 MCG tablet TAKE 1 TABLET(150 MCG) BY MOUTH DAILY BEFORE BREAKFAST 90 tablet 1  . loratadine (CLARITIN) 10 MG tablet Take 1 tablet (10 mg total) by mouth daily. 30 tablet 11  . losartan (COZAAR) 100 MG tablet Take 1 tablet (100 mg total) by mouth daily. 30 tablet 11  . sertraline (ZOLOFT) 100 MG tablet Take 1 tablet (100 mg total) by mouth daily. 90 tablet 3  . vitamin B-12 (CYANOCOBALAMIN) 500 MCG tablet Take 500 mcg by mouth daily.    . mirabegron ER (MYRBETRIQ) 50 MG TB24 tablet Take 1 tablet (50 mg total) by mouth daily. (Patient not taking: Reported on 01/27/2019) 30 tablet 11  . oxybutynin (DITROPAN XL) 10 MG 24 hr tablet Take 1 tablet (10 mg total) by mouth at bedtime. (Patient not taking: Reported on 01/27/2019) 30 tablet 11  . pantoprazole (PROTONIX) 40 MG tablet Take 1 tablet (40 mg total) by mouth daily. (Patient not taking: Reported on 01/27/2019) 30 tablet 11   No current facility-administered medications for this visit.      ALLERGIES: Tribenzor [olmesartan-amlodipine-hctz]  Family History  Problem Relation Age of Onset  . Breast cancer Mother   . Cancer Mother        breast  . Hypertension Mother   . Colon cancer Maternal Grandmother   . Cancer Maternal Grandmother        colon    Social History   Socioeconomic History  . Marital status: Single    Spouse name: Not on file  . Number of children: Not on file  . Years of education: Not on file  . Highest education level: Not on file  Occupational History  . Occupation: [employer]     Comment: used to work in Therapist, art  Social Needs  . Financial resource strain: Not on file  . Food insecurity    Worry: Not on file    Inability: Not on file  . Transportation needs    Medical: Not on file    Non-medical: Not on file  Tobacco Use  . Smoking status: Never  Smoker  . Smokeless tobacco: Never Used  Substance and Sexual Activity  . Alcohol use: No    Alcohol/week: 0.0 standard drinks  . Drug use: No  . Sexual activity: Never    Partners: Male    Birth control/protection: Surgical  Lifestyle  . Physical activity    Days per week: Not on file    Minutes per session: Not on file  . Stress: Not on file  Relationships  . Social Herbalist on phone: Not on file    Gets together: Not on file    Attends religious service: Not on file    Active member of club or organization: Not on file    Attends meetings of clubs or organizations: Not on file    Relationship status: Not on file  . Intimate partner violence    Fear of current or ex partner: Not on file    Emotionally abused: Not on file    Physically abused: Not on file    Forced sexual activity: Not on file  Other Topics Concern  . Not on file  Social History Narrative  . Not on file    Review of Systems  Constitutional: Negative.   HENT: Negative.   Eyes: Negative.   Respiratory: Negative.   Cardiovascular: Negative.   Gastrointestinal: Negative.   Genitourinary: Positive for frequency and urgency.       Odor Burning   Musculoskeletal: Negative.   Skin: Negative.   Neurological: Negative.   Endo/Heme/Allergies: Negative.   Psychiatric/Behavioral: Negative.     PHYSICAL EXAMINATION:    BP (!) 150/90 (BP Location: Right Arm, Patient Position: Sitting, Cuff Size: Large)   Pulse 68   Temp 97.9 F (36.6 C) (Temporal)   Resp 14   Wt (!) 324 lb (147 kg)   BMI 53.10 kg/m     General appearance: alert, cooperative and appears stated age Abdomen: obese,soft, non-tender; non  distended, no masses,  no organomegaly CVA: not tender  Pelvic: External genitalia:  no lesions              Urethra:  normal appearing urethra with no masses, tenderness or lesions              Bartholins and Skenes: normal                 Vagina: atrophic appearing vagina with normal color, no discharge, no lesions              Cervix: no lesions and grade 2 stump prolapse              Bimanual Exam:  Uterus:  uterus absent              Adnexa: no mass, fullness, tenderness              Rectovaginal: Yes.  .  Confirms.              Anus:  normal sphincter tone, no lesions. No clear fissure or hemorrhoid seen  Chaperone was present for exam.  ASSESSMENT Dysuria Vaginal odor Mixed incontinence, on 2 medications Nocturia Enuresis Rectal bleeding intermittently for years when she strains with BM, normal exam, normal colonoscopy 4 years ago    PLAN Send urine for ua, c&s Treat depending on results Referral to Urology Affirm We discussed using a vaginal moisturizer.      An After Visit Summary was printed and given to the patient.  ~20 minutes face to face  time of which over 50% was spent in counseling.   She c/o breast tenderness since her mammogram in 7/20. Will set her up to return for her annual exam and further evaluation.

## 2019-01-27 ENCOUNTER — Encounter: Payer: Self-pay | Admitting: Obstetrics and Gynecology

## 2019-01-27 ENCOUNTER — Ambulatory Visit: Payer: Managed Care, Other (non HMO) | Admitting: Obstetrics and Gynecology

## 2019-01-27 VITALS — BP 150/90 | HR 68 | Temp 97.9°F | Resp 14 | Wt 324.0 lb

## 2019-01-27 DIAGNOSIS — R351 Nocturia: Secondary | ICD-10-CM | POA: Diagnosis not present

## 2019-01-27 DIAGNOSIS — N3946 Mixed incontinence: Secondary | ICD-10-CM

## 2019-01-27 DIAGNOSIS — R300 Dysuria: Secondary | ICD-10-CM

## 2019-01-27 DIAGNOSIS — N898 Other specified noninflammatory disorders of vagina: Secondary | ICD-10-CM | POA: Diagnosis not present

## 2019-01-27 DIAGNOSIS — K625 Hemorrhage of anus and rectum: Secondary | ICD-10-CM

## 2019-01-27 DIAGNOSIS — R32 Unspecified urinary incontinence: Secondary | ICD-10-CM

## 2019-01-27 LAB — POCT URINALYSIS DIPSTICK
Bilirubin, UA: NEGATIVE
Glucose, UA: NEGATIVE
Ketones, UA: NEGATIVE
Leukocytes, UA: NEGATIVE
Nitrite, UA: NEGATIVE
Protein, UA: NEGATIVE
Urobilinogen, UA: 0.2 E.U./dL
pH, UA: 5 (ref 5.0–8.0)

## 2019-01-27 NOTE — Patient Instructions (Addendum)
You can try a vaginal moisturizer (ie Replens) for vaginal odor.

## 2019-01-28 LAB — URINALYSIS, MICROSCOPIC ONLY
Bacteria, UA: NONE SEEN
Casts: NONE SEEN /lpf

## 2019-01-28 LAB — VAGINITIS/VAGINOSIS, DNA PROBE
Candida Species: NEGATIVE
Gardnerella vaginalis: NEGATIVE
Trichomonas vaginosis: NEGATIVE

## 2019-01-29 LAB — URINE CULTURE

## 2019-01-30 ENCOUNTER — Telehealth: Payer: Self-pay

## 2019-01-30 NOTE — Telephone Encounter (Signed)
-----   Message from Salvadore Dom, MD sent at 01/30/2019 10:23 AM EDT ----- Please let the patient know that her urinalysis returned with crystals, this is what forms kidney stones. She should stay well hydrated with water to prevent kidney stone formation. Her urinalysis was otherwise negative. Her urine culture didn't grow out a single organism, given her long term symptoms, we should repeat the urine culture (culture only).  Her vaginitis panel is negative for infection, she should try a vaginal moisturizer as we discussed to see if this helps the odor.

## 2019-01-30 NOTE — Telephone Encounter (Signed)
Spoke with patient. Results given. Patient verbalizes understanding. Patient will have to return call to schedule nurse visit for urine culture. Patient will try OTC vaginal moisturizer and call if symptoms persist. Encounter closed.

## 2019-01-30 NOTE — Telephone Encounter (Signed)
Left message to call Kaitlyn at [phone]. 

## 2019-01-31 ENCOUNTER — Other Ambulatory Visit: Payer: Self-pay

## 2019-02-01 ENCOUNTER — Telehealth: Payer: Self-pay | Admitting: Obstetrics and Gynecology

## 2019-02-01 NOTE — Telephone Encounter (Signed)
Left voicemail regarding referral appointment. The information is listed below. Should the patient need to cancel or reschedule this appointment, Please advise them to call the office they've been referred to in order to reschedule.   Dr. Matilde Sprang 02/23/19 @ 9:00 am. Please arrive 15 minutes early and bring your insurance card and photo id and list of medications. [facility] [address], [place]  Phone: [phone]

## 2019-02-03 ENCOUNTER — Other Ambulatory Visit: Payer: Self-pay

## 2019-02-03 ENCOUNTER — Ambulatory Visit (INDEPENDENT_AMBULATORY_CARE_PROVIDER_SITE_OTHER): Payer: Managed Care, Other (non HMO)

## 2019-02-03 VITALS — BP 122/84 | HR 70 | Resp 16 | Wt 324.0 lb

## 2019-02-03 DIAGNOSIS — N3946 Mixed incontinence: Secondary | ICD-10-CM

## 2019-02-03 NOTE — Progress Notes (Signed)
Urine culture sent today. Patient states she feels fine.

## 2019-02-06 ENCOUNTER — Telehealth: Payer: Self-pay

## 2019-02-06 NOTE — Telephone Encounter (Signed)
ATC pt, line went to voicemail. LMTCB x1. 

## 2019-02-06 NOTE — Telephone Encounter (Signed)
LMTCB

## 2019-02-06 NOTE — Telephone Encounter (Signed)
Pt returning call and can be reached @ [phone].Tracy Sampson

## 2019-02-07 NOTE — Telephone Encounter (Signed)
lmtcb for pt.  

## 2019-02-07 NOTE — Telephone Encounter (Signed)
Spoke with pt, she was contacted by the CPAP compliance department and was advised that she wasn't using her CPAP 73% of the time and that she needed to turn in her machine. It looks like she is with [facility] but Jeneen Rinks stated they never set her up with a CPAP. I called [facility] and they stated they didn't have her as a patient in their system. Pt takes another break at 2pm, I will call her back to see where she got her CPAP machine from.    2pm 415pm

## 2019-02-07 NOTE — Telephone Encounter (Signed)
PT returning call and can be reached @ [phone] or [phone]  pt works from [employer] and is on her phone and may not be able to answer, she starts work @ around [facility] and then takes a break around 11.Tracy Sampson

## 2019-02-08 ENCOUNTER — Telehealth: Payer: Self-pay

## 2019-02-08 LAB — URINE CULTURE

## 2019-02-08 MED ORDER — SULFAMETHOXAZOLE-TRIMETHOPRIM 800-160 MG PO TABS: 1.0000 | ORAL_TABLET | Freq: Two times a day (BID) | ORAL | 0 refills | Status: DC

## 2019-02-08 NOTE — Telephone Encounter (Signed)
LMTCB x1 for pt.  

## 2019-02-08 NOTE — Telephone Encounter (Signed)
Left detailed message, okay per ROI. Advised of results and that Bactrim DS 1 po BID x 3 day #6 0RF has been sent to pharmacy on file. Advised to call with any continued or worsening symptoms. Encounter closed.

## 2019-02-08 NOTE — Telephone Encounter (Signed)
-----   Message from Salvadore Dom, MD sent at 02/08/2019 12:50 PM EDT ----- Please let the patient that her urine culture finally resulted today. She does have a UTI, treating this might help her incontinence some. Please call in bactrim ds, 1 po BID x 3 days.

## 2019-02-08 NOTE — Telephone Encounter (Signed)
Placed second call to convey referral appointment details.

## 2019-02-09 NOTE — Telephone Encounter (Signed)
Patient states she used [facility]. She has been getting phone calls saying they would take away her machine. Patient is using machine. I told her to call [facility] and get clarification if they were taking her machine and if so what needed to be done to let her keep the machine. Or have [facility] call us.   Patient voiced understanding. Will leave open until we hear back from patient

## 2019-02-10 NOTE — Telephone Encounter (Signed)
Waiting on call back.

## 2019-02-13 NOTE — Telephone Encounter (Signed)
LMTCB with patient. Unfortunately if she is not compliant insurance will not pay and she will be required to repeat the sleep study in order to get cpap back.   [facility] called me back, she reports she is not compliant however they have not been calling her. She reports her insurance ([employer]) do their own monitoring and they will call the patients because they do their own monitoring. She went ahead and linked [place] to her air view account.   According to air view patient has not used the machine from 12/29/2018 through 02/04/2019.

## 2019-02-13 NOTE — Telephone Encounter (Signed)
Call made to [facility], spoke with Sonia Baller, she does not see any documentation where the patient needs to turn in her machine. She is going to get a message to the cpap team and have them give [place] a call back. Will await a call back.

## 2019-02-14 NOTE — Telephone Encounter (Signed)
ATC Patient.  LM to call back when available.  

## 2019-02-15 NOTE — Telephone Encounter (Signed)
LMTCB x2 for pt 

## 2019-02-16 ENCOUNTER — Encounter: Payer: Self-pay | Admitting: *Deleted

## 2019-02-16 ENCOUNTER — Telehealth: Payer: Self-pay | Admitting: Pulmonary Disease

## 2019-02-16 NOTE — Telephone Encounter (Signed)
ATC patient unable to reach LM to call back office (x3)   Letter sent for patient to call our office related to this encounter will close per protocol attempt 3 times

## 2019-02-16 NOTE — Telephone Encounter (Signed)
LMTCB

## 2019-02-20 NOTE — Telephone Encounter (Signed)
Left message for patient to call back  

## 2019-02-20 NOTE — Telephone Encounter (Signed)
LMTCB

## 2019-02-20 NOTE — Telephone Encounter (Signed)
Pt returning call and would like a call back.

## 2019-02-21 NOTE — Telephone Encounter (Signed)
LMTCB x2 for pt 

## 2019-02-22 NOTE — Telephone Encounter (Signed)
LMTCB x3 for pt. Multiple attempts have been made to reach this pt. Per triage protocol, will close this encounter. Nothing further needed at this time.

## 2019-02-23 ENCOUNTER — Telehealth: Payer: Self-pay | Admitting: Pulmonary Disease

## 2019-02-23 NOTE — Telephone Encounter (Signed)
LMTCB

## 2019-02-24 NOTE — Telephone Encounter (Signed)
Noted. Will close this message since nothing is needed.

## 2019-02-24 NOTE — Telephone Encounter (Signed)
Pt returned call. Stated that she doesn't understand why she keeps receiving calls from [place]. Also stated that everything is fine and worked out with [facility] for her cpap machine and supplies

## 2019-03-09 ENCOUNTER — Ambulatory Visit: Payer: Managed Care, Other (non HMO) | Admitting: Obstetrics and Gynecology

## 2019-03-22 ENCOUNTER — Other Ambulatory Visit: Payer: Self-pay

## 2019-03-24 ENCOUNTER — Other Ambulatory Visit (HOSPITAL_COMMUNITY)
Admission: RE | Admit: 2019-03-24 | Discharge: 2019-03-24 | Disposition: A | Discharge status: Home / Self Care | Payer: Managed Care, Other (non HMO) | Source: Ambulatory Visit | Attending: Obstetrics and Gynecology | Admitting: Obstetrics and Gynecology

## 2019-03-24 ENCOUNTER — Encounter: Payer: Self-pay | Admitting: Obstetrics and Gynecology

## 2019-03-24 ENCOUNTER — Other Ambulatory Visit: Payer: Self-pay

## 2019-03-24 ENCOUNTER — Ambulatory Visit: Payer: Managed Care, Other (non HMO) | Admitting: Obstetrics and Gynecology

## 2019-03-24 VITALS — BP 142/82 | HR 82 | Temp 97.1°F | Ht 65.75 in | Wt 329.0 lb

## 2019-03-24 DIAGNOSIS — N3946 Mixed incontinence: Secondary | ICD-10-CM

## 2019-03-24 DIAGNOSIS — R351 Nocturia: Secondary | ICD-10-CM | POA: Diagnosis not present

## 2019-03-24 DIAGNOSIS — Z01419 Encounter for gynecological examination (general) (routine) without abnormal findings: Secondary | ICD-10-CM

## 2019-03-24 DIAGNOSIS — N939 Abnormal uterine and vaginal bleeding, unspecified: Secondary | ICD-10-CM | POA: Diagnosis not present

## 2019-03-24 DIAGNOSIS — Z124 Encounter for screening for malignant neoplasm of cervix: Secondary | ICD-10-CM

## 2019-03-24 DIAGNOSIS — N8185 Cervical stump prolapse: Secondary | ICD-10-CM

## 2019-03-24 DIAGNOSIS — N898 Other specified noninflammatory disorders of vagina: Secondary | ICD-10-CM | POA: Diagnosis not present

## 2019-03-24 DIAGNOSIS — Z8719 Personal history of other diseases of the digestive system: Secondary | ICD-10-CM

## 2019-03-24 NOTE — Progress Notes (Signed)
63 y.o. G0P0000 Single Black or African American Not Hispanic or Latino female here for annual exam.  H/O supracervical hysterectomy. H/O cervical stump prolapse, no symptoms.  H/O mixed incontinence. On ditropan and mrybetriq.  Nocturia, enuresis. She has an appointment to see Urology in 2 weeks.  Long term c/o vaginal odor, negative vaginitis testing.   She c/o fatigue, low energy.  She has gained over 30 lbs in the last 3-4 months. Normal CBC and TSH in 6/20. She has been working at home. Not eating healthy.  She denies depression.   She c/o intermittent bright red bleeding with BM, no pain, no h/o hemorrhoids. She also reports recent vaginal bleeding after BM, moderate amount.      No LMP recorded. Patient has had a hysterectomy.          Sexually active: No.  The current method of family planning is status post hysterectomy.    Exercising: No.  The patient does not participate in regular exercise at present. Smoker:  no  Health Maintenance: Pap:  11/16/2016 WNL NEG HPV, 07-11-14 WNL NEG HR HPV History of abnormal Pap:  no MMG:  11/17/2018 Birads 1 negative Colonoscopy:  07-12-15 polyps repeat in 5 year BMD:   Never TDaP:  12-04-14  Gardasil: NA   reports that she has never smoked. She has never used smokeless tobacco. She reports that she does not drink alcohol or use drugs. She works in [employer].   Past Medical History:  Diagnosis Date  . Allergic rhinitis   . Allergy   . Arthritis   . Cataract    bilateral removed  . Depression   . GERD (gastroesophageal reflux disease)   . Glaucoma   . Hypertension   . Hypothyroidism   . Morbid obesity ([place])   . OSA (obstructive sleep apnea)    Dr Joya Gaskins  . Sleep apnea    wears cpap  . Thyroid disease   . Urinary incontinence     Past Surgical History:  Procedure Laterality Date  . abdominal supracervical hysterectomy with bilateral salpingo-oophorectomy     fibroid uterus  . ANKLE SURGERY  4/10   rt  . BREAST  REDUCTION SURGERY    . CATARACT EXTRACTION    . COLONOSCOPY    . HERNIA REPAIR  0000000   umbillical  . INSERTION OF MESH N/A 01/07/2016   Procedure: INSERTION OF MESH;  Surgeon: Coralie Keens, MD;  Location: [facility];  Service: General;  Laterality: N/A;  . REDUCTION MAMMAPLASTY    . VENTRAL HERNIA REPAIR N/A 01/07/2016   Procedure: VENTRAL HERNIA REPAIR;  Surgeon: Coralie Keens, MD;  Location: [facility];  Service: General;  Laterality: N/A;    Current Outpatient Medications  Medication Sig Dispense Refill  . ALPHAGAN P 0.1 % SOLN Place 1 drop into both eyes 2 (two) times daily.  0  . amLODipine (NORVASC) 5 MG tablet Take 1 tablet (5 mg total) by mouth daily. 90 tablet 0  . dorzolamide-timolol (COSOPT) 22.3-6.8 MG/ML ophthalmic solution     . ibuprofen (ADVIL,MOTRIN) 600 MG tablet Take 1 tablet (600 mg total) by mouth every 8 (eight) hours as needed. 30 tablet 1  . levothyroxine (SYNTHROID) 150 MCG tablet TAKE 1 TABLET(150 MCG) BY MOUTH DAILY BEFORE BREAKFAST 90 tablet 1  . sertraline (ZOLOFT) 100 MG tablet Take 1 tablet (100 mg total) by mouth daily. 90 tablet 3  . vitamin B-12 (CYANOCOBALAMIN) 500 MCG tablet Take 500 mcg by mouth daily.    [name]  VITAMIN D PO Take by mouth. 1000 IU daily    . loratadine (CLARITIN) 10 MG tablet Take 1 tablet (10 mg total) by mouth daily. (Patient not taking: Reported on 03/24/2019) 30 tablet 11  . losartan (COZAAR) 100 MG tablet Take 1 tablet (100 mg total) by mouth daily. (Patient not taking: Reported on 03/24/2019) 30 tablet 11  . mirabegron ER (MYRBETRIQ) 50 MG TB24 tablet Take 1 tablet (50 mg total) by mouth daily. (Patient not taking: Reported on 01/27/2019) 30 tablet 11  . oxybutynin (DITROPAN XL) 10 MG 24 hr tablet Take 1 tablet (10 mg total) by mouth at bedtime. (Patient not taking: Reported on 01/27/2019) 30 tablet 11  . pantoprazole (PROTONIX) 40 MG tablet Take 1 tablet (40 mg total) by mouth daily. (Patient not taking: Reported on 01/27/2019) 30 tablet 11    No current facility-administered medications for this visit.     Family History  Problem Relation Age of Onset  . Breast cancer Mother   . Cancer Mother        breast  . Hypertension Mother   . Colon cancer Maternal Grandmother   . Cancer Maternal Grandmother        colon    Review of Systems  Constitutional: Positive for fatigue.  HENT: Negative.   Eyes: Negative.   Respiratory: Negative.   Cardiovascular: Negative.   Gastrointestinal: Negative.   Endocrine: Negative.   Genitourinary: Negative.   Musculoskeletal: Negative.   Skin: Negative.   Allergic/Immunologic: Negative.   Neurological: Negative.   Hematological: Negative.   Psychiatric/Behavioral: Negative.     Exam:   BP (!) 142/82 (BP Location: Right Arm, Patient Position: Sitting, Cuff Size: Large)   Pulse 82   Temp (!) 97.1 F (36.2 C) (Skin)   Ht 5' 5.75" (1.67 m)   Wt (!) 329 lb (149.2 kg)   BMI 53.51 kg/m   Weight change: @WEIGHTCHANGE @ Height:   Height: 5' 5.75" (167 cm)  Ht Readings from Last 3 Encounters:  03/24/19 5' 5.75" (1.67 m)  10/27/18 5' 5.5" (1.664 m)  05/09/18 5' 5.5" (1.664 m)    General appearance: alert, cooperative and appears stated age Head: Normocephalic, without obvious abnormality, atraumatic Neck: no adenopathy, supple, symmetrical, trachea midline and thyroid normal to inspection and palpation Lungs: clear to auscultation bilaterally Cardiovascular: regular rate and rhythm Breasts: evidence of bilateral breast reduction, mildly tender on the underside of her left breast, no lumps, no skin changes.  Abdomen: soft, non-tender; non distended,  no masses,  no organomegaly Extremities: extremities normal, atraumatic, no cyanosis or edema Skin: Skin color, texture, turgor normal. No rashes or lesions Lymph nodes: Cervical, supraclavicular, and axillary nodes normal. No abnormal inguinal nodes palpated Neurologic: Grossly normal   Pelvic: External genitalia:  no lesions               Urethra:  normal appearing urethra with no masses, tenderness or lesions              Bartholins and Skenes: normal                 Vagina: normal appearing vagina with normal color and discharge, no lesions              Cervix: initially question of a very small polyp just at the external os. Pap was done and abnormality no longer seen. Not friable, no ectropion.               Bimanual Exam:  Uterus:  uterus absent              Adnexa: no mass, fullness, tenderness               Rectovaginal: Confirms               Anus:  normal sphincter tone, no lesions  Chaperone was present for exam.  A:  Well Woman with normal exam  Mixed incontinence, nocturia, enuresis   Weight gain  Fatigue, normal CBC, TSH  Intermittent rectal bleeding with BM, no external hemorrhoids, no pain  Bleeding from cervical stump  Mild breast tenderness, recent normal imaging. Recommended she cut back on her caffeine.   P:   Pap done secondary to bleeding  Discussed weight watchers  She went to the weight loss clinic and didn't like it.   Mammogram and colonoscopy are UTD  Discussed breast self exam  Discussed calcium and vit D intake  Referral to GI placed  Return for GYN ultrasound

## 2019-03-24 NOTE — Patient Instructions (Addendum)
Try Replens for the vaginal odor, it is a vaginal moisturizer  EXERCISE AND DIET:  We recommended that you start or continue a regular exercise program for good health. Regular exercise means any activity that makes your heart beat faster and makes you sweat.  We recommend exercising at least 30 minutes per day at least 3 days a week, preferably 4 or 5.  We also recommend a diet low in fat and sugar.  Inactivity, poor dietary choices and obesity can cause diabetes, heart attack, stroke, and kidney damage, among others.    ALCOHOL AND SMOKING:  Women should limit their alcohol intake to no more than 7 drinks/beers/glasses of wine (combined, not each!) per week. Moderation of alcohol intake to this level decreases your risk of breast cancer and liver damage. And of course, no recreational drugs are part of a healthy lifestyle.  And absolutely no smoking or even second hand smoke. Most people know smoking can cause heart and lung diseases, but did you know it also contributes to weakening of your bones? Aging of your skin?  Yellowing of your teeth and nails?  CALCIUM AND VITAMIN D:  Adequate intake of calcium and Vitamin D are recommended.  The recommendations for exact amounts of these supplements seem to change often, but generally speaking 1,200 mg of calcium (between diet and supplement) and 800 units of Vitamin D per day seems prudent. Certain women may benefit from higher intake of Vitamin D.  If you are among these women, your doctor will have told you during your visit.    PAP SMEARS:  Pap smears, to check for cervical cancer or precancers,  have traditionally been done yearly, although recent scientific advances have shown that most women can have pap smears less often.  However, every woman still should have a physical exam from her gynecologist every year. It will include a breast check, inspection of the vulva and vagina to check for abnormal growths or skin changes, a visual exam of the cervix, and  then an exam to evaluate the size and shape of the uterus and ovaries.  And after 64 years of age, a rectal exam is indicated to check for rectal cancers. We will also provide age appropriate advice regarding health maintenance, like when you should have certain vaccines, screening for sexually transmitted diseases, bone density testing, colonoscopy, mammograms, etc.   MAMMOGRAMS:  All women over 73 years old should have a yearly mammogram. Many facilities now offer a "3D" mammogram, which may cost around $50 extra out of pocket. If possible,  we recommend you accept the option to have the 3D mammogram performed.  It both reduces the number of women who will be called back for extra views which then turn out to be normal, and it is better than the routine mammogram at detecting truly abnormal areas.    COLON CANCER SCREENING: Now recommend starting at age 14. At this time colonoscopy is not covered for routine screening until 50. There are take home tests that can be done between 45-49.   COLONOSCOPY:  Colonoscopy to screen for colon cancer is recommended for all women at age 70.  We know, you hate the idea of the prep.  We agree, BUT, having colon cancer and not knowing it is worse!!  Colon cancer so often starts as a polyp that can be seen and removed at colonscopy, which can quite literally save your life!  And if your first colonoscopy is normal and you have no family history  of colon cancer, most women don't have to have it again for 10 years.  Once every ten years, you can do something that may end up saving your life, right?  We will be happy to help you get it scheduled when you are ready.  Be sure to check your insurance coverage so you understand how much it will cost.  It may be covered as a preventative service at no cost, but you should check your particular policy.      Breast Self-Awareness Breast self-awareness means being familiar with how your breasts look and feel. It involves checking  your breasts regularly and reporting any changes to your health care provider. Practicing breast self-awareness is important. A change in your breasts can be a sign of a serious medical problem. Being familiar with how your breasts look and feel allows you to find any problems early, when treatment is more likely to be successful. All women should practice breast self-awareness, including women who have had breast implants. How to do a breast self-exam One way to learn what is normal for your breasts and whether your breasts are changing is to do a breast self-exam. To do a breast self-exam: Look for Changes  1. Remove all the clothing above your waist. 2. Stand in front of a mirror in a room with good lighting. 3. Put your hands on your hips. 4. Push your hands firmly downward. 5. Compare your breasts in the mirror. Look for differences between them (asymmetry), such as: ? Differences in shape. ? Differences in size. ? Puckers, dips, and bumps in one breast and not the other. 6. Look at each breast for changes in your skin, such as: ? Redness. ? Scaly areas. 7. Look for changes in your nipples, such as: ? Discharge. ? Bleeding. ? Dimpling. ? Redness. ? A change in position. Feel for Changes Carefully feel your breasts for lumps and changes. It is best to do this while lying on your back on the floor and again while sitting or standing in the shower or tub with soapy water on your skin. Feel each breast in the following way:  Place the arm on the side of the breast you are examining above your head.  Feel your breast with the other hand.  Start in the nipple area and make  inch (2 cm) overlapping circles to feel your breast. Use the pads of your three middle fingers to do this. Apply light pressure, then medium pressure, then firm pressure. The light pressure will allow you to feel the tissue closest to the skin. The medium pressure will allow you to feel the tissue that is a little  deeper. The firm pressure will allow you to feel the tissue close to the ribs.  Continue the overlapping circles, moving downward over the breast until you feel your ribs below your breast.  Move one finger-width toward the center of the body. Continue to use the  inch (2 cm) overlapping circles to feel your breast as you move slowly up toward your collarbone.  Continue the up and down exam using all three pressures until you reach your armpit.  Write Down What You Find  Write down what is normal for each breast and any changes that you find. Keep a written record with breast changes or normal findings for each breast. By writing this information down, you do not need to depend only on memory for size, tenderness, or location. Write down where you are in your menstrual cycle,  if you are still menstruating. If you are having trouble noticing differences in your breasts, do not get discouraged. With time you will become more familiar with the variations in your breasts and more comfortable with the exam. How often should I examine my breasts? Examine your breasts every month. If you are breastfeeding, the best time to examine your breasts is after a feeding or after using a breast pump. If you menstruate, the best time to examine your breasts is 5-7 days after your period is over. During your period, your breasts are lumpier, and it may be more difficult to notice changes. When should I see my health care provider? See your health care provider if you notice:  A change in shape or size of your breasts or nipples.  A change in the skin of your breast or nipples, such as a reddened or scaly area.  Unusual discharge from your nipples.  A lump or thick area that was not there before.  Pain in your breasts.  Anything that concerns you.

## 2019-03-27 ENCOUNTER — Telehealth: Payer: Self-pay | Admitting: Obstetrics and Gynecology

## 2019-03-27 NOTE — Telephone Encounter (Signed)
Call placed to patient to review benefit and schedule recommended ultrasound. Left voicemail message requesting a return call °

## 2019-03-27 NOTE — Telephone Encounter (Signed)
Spoke with patient in regards to benefit for recommended ultrasound. Patient acknowledges understanding of information presented. Patient is scheduled 04/11/2019 with Dr Talbert Nan. Patient declined earlier appointment dates, due to work schedule. Patient is aware of the appointment date, arrival time and cancellation policy. No further questions.   Forwarding to Dr Talbert Nan for final review. Patient is agreeable to disposition. Will close encounter

## 2019-03-29 LAB — CYTOLOGY - PAP: Diagnosis: NEGATIVE

## 2019-04-10 NOTE — Progress Notes (Signed)
GYNECOLOGY  VISIT   HPI: 63 y.o.   Single Black or African American Not Hispanic or Latino  female   [id number] with No LMP recorded. Patient has had a hysterectomy.   here for evaluation of bleeding from her cervical stump. Normal pap this month.     GYNECOLOGIC HISTORY: No LMP recorded. Patient has had a hysterectomy. Contraception: PMP Menopausal hormone therapy: none        OB History    Gravida  0   Para  0   Term  0   Preterm  0   AB  0   Living  0     SAB  0   TAB  0   Ectopic  0   Multiple  0   Live Births                 Patient Active Problem List   Diagnosis Date Noted  . Shoulder pain, right 11/15/2017  . OAB (overactive bladder) 11/15/2017  . Contact dermatitis 07/07/2017  . Head congestion 07/07/2017  . Subconjunctival hemorrhage of right eye 12/04/2016  . Incarcerated ventral hernia 01/07/2016  . Left ear pain 06/06/2015  . Rectal bleeding 02/06/2015  . Blood in stool 12/04/2014  . Body odor 12/04/2014  . Umbilical hernia [id number]  . Incontinence in female 08/16/2013  . Trigger finger, acquired 08/16/2013  . Well adult exam 01/11/2012  . Nocturia 02/27/2011  . Paresthesia 10/23/2010  . Ventral hernia 03/01/2009  . Vitamin D deficiency 08/28/2008  . KNEE PAIN 08/28/2008  . OBESITY, MORBID 09/23/2007  . RASH AND OTHER NONSPECIFIC SKIN ERUPTION 09/07/2007  . Hypothyroidism 05/10/2007  . Depression 05/03/2007  . Essential hypertension 05/03/2007  . Allergic rhinitis 05/03/2007  . G E R D 05/03/2007  . OSA on CPAP 05/03/2007  . SUBMUCOUS LEIOMYOMA OF UTERUS 05/02/2007  . GOITER, MULTINODULAR 05/02/2007  . DYSPHAGIA UNSPECIFIED 05/02/2007    Past Medical History:  Diagnosis Date  . Allergic rhinitis   . Allergy   . Arthritis   . Cataract    bilateral removed  . Depression   . GERD (gastroesophageal reflux disease)   . Glaucoma   . Hypertension   . Hypothyroidism   . Morbid obesity ([place])   . OSA (obstructive sleep apnea)     Dr Joya Gaskins  . Sleep apnea    wears cpap  . Thyroid disease   . Urinary incontinence     Past Surgical History:  Procedure Laterality Date  . abdominal supracervical hysterectomy with bilateral salpingo-oophorectomy     fibroid uterus  . ANKLE SURGERY  4/10   rt  . BREAST REDUCTION SURGERY    . CATARACT EXTRACTION    . COLONOSCOPY    . HERNIA REPAIR  0000000   umbillical  . INSERTION OF MESH N/A 01/07/2016   Procedure: INSERTION OF MESH;  Surgeon: Coralie Keens, MD;  Location: [facility];  Service: General;  Laterality: N/A;  . REDUCTION MAMMAPLASTY    . VENTRAL HERNIA REPAIR N/A 01/07/2016   Procedure: VENTRAL HERNIA REPAIR;  Surgeon: Coralie Keens, MD;  Location: [facility];  Service: General;  Laterality: N/A;    Current Outpatient Medications  Medication Sig Dispense Refill  . ALPHAGAN P 0.1 % SOLN Place 1 drop into both eyes 2 (two) times daily.  0  . amLODipine (NORVASC) 5 MG tablet Take 1 tablet (5 mg total) by mouth daily. 90 tablet 0  . dorzolamide-timolol (COSOPT) 22.3-6.8 MG/ML ophthalmic solution     .  ibuprofen (ADVIL,MOTRIN) 600 MG tablet Take 1 tablet (600 mg total) by mouth every 8 (eight) hours as needed. 30 tablet 1  . levothyroxine (SYNTHROID) 150 MCG tablet TAKE 1 TABLET(150 MCG) BY MOUTH DAILY BEFORE BREAKFAST 90 tablet 1  . loratadine (CLARITIN) 10 MG tablet Take 1 tablet (10 mg total) by mouth daily. 30 tablet 11  . losartan (COZAAR) 100 MG tablet Take 1 tablet (100 mg total) by mouth daily. 30 tablet 11  . mirabegron ER (MYRBETRIQ) 50 MG TB24 tablet Take 1 tablet (50 mg total) by mouth daily. 30 tablet 11  . oxybutynin (DITROPAN XL) 10 MG 24 hr tablet Take 1 tablet (10 mg total) by mouth at bedtime. 30 tablet 11  . pantoprazole (PROTONIX) 40 MG tablet Take 1 tablet (40 mg total) by mouth daily. 30 tablet 11  . sertraline (ZOLOFT) 100 MG tablet Take 1 tablet (100 mg total) by mouth daily. 90 tablet 3  . vitamin B-12 (CYANOCOBALAMIN) 500 MCG tablet Take 500 mcg  by mouth daily.    [name] VITAMIN D PO Take by mouth. 1000 IU daily     No current facility-administered medications for this visit.      ALLERGIES: Tribenzor [olmesartan-amlodipine-hctz]  Family History  Problem Relation Age of Onset  . Breast cancer Mother   . Cancer Mother        breast  . Hypertension Mother   . Colon cancer Maternal Grandmother   . Cancer Maternal Grandmother        colon    Social History   Socioeconomic History  . Marital status: Single    Spouse name: Not on file  . Number of children: Not on file  . Years of education: Not on file  . Highest education level: Not on file  Occupational History  . Occupation: [employer]    Comment: used to work in Therapist, art  Social Needs  . Financial resource strain: Not on file  . Food insecurity    Worry: Not on file    Inability: Not on file  . Transportation needs    Medical: Not on file    Non-medical: Not on file  Tobacco Use  . Smoking status: Never Smoker  . Smokeless tobacco: Never Used  Substance and Sexual Activity  . Alcohol use: No    Alcohol/week: 0.0 standard drinks  . Drug use: No  . Sexual activity: Not Currently    Partners: Male    Birth control/protection: Surgical  Lifestyle  . Physical activity    Days per week: Not on file    Minutes per session: Not on file  . Stress: Not on file  Relationships  . Social Herbalist on phone: Not on file    Gets together: Not on file    Attends religious service: Not on file    Active member of club or organization: Not on file    Attends meetings of clubs or organizations: Not on file    Relationship status: Not on file  . Intimate partner violence    Fear of current or ex partner: Not on file    Emotionally abused: Not on file    Physically abused: Not on file    Forced sexual activity: Not on file  Other Topics Concern  . Not on file  Social History Narrative  . Not on file    Review of Systems  Constitutional: Negative.    HENT: Negative.   Eyes: Negative.   Respiratory: Negative.  Cardiovascular: Negative.   Gastrointestinal: Negative.   Genitourinary: Negative.   Musculoskeletal: Negative.   Skin: Negative.   Neurological: Negative.   Endo/Heme/Allergies: Negative.   Psychiatric/Behavioral: Negative.     PHYSICAL EXAMINATION:    BP (!) 158/100 (BP Location: Right Arm, Patient Position: Sitting, Cuff Size: Large)   Pulse 80   Temp (!) 97.1 F (36.2 C) (Skin)   Wt (!) 327 lb (148.3 kg)   BMI 53.18 kg/m     General appearance: alert, cooperative and appears stated age  Pelvic: External genitalia:  no lesions              Urethra:  normal appearing urethra with no masses, tenderness or lesions              Bartholins and Skenes: normal                 Vagina: normal appearing vagina with normal color and discharge, no lesions              Cervix: stenotic, no lesions.   Ultrasound observed, cervical length over 2-3 cm. There appears to be a one cm endocervical polyp within the endocervix.  Verbal consent was obtained.  The cervix was cleansed with betadine and a tenaculum was placed at 12 o'clock. The external os was carefully dilated with the #3 and then #4 hagar dilators, tight with dilation with the #4 dilator. I was careful not to place the dilator more than 1 cm into the external os. An attempt was made to place polyp forceps in the distal cervix, small amount of suspected polyp removed. Unable to get the polyp forceps up inside her cervix. After the procedure was done the ultrasound was repeated and no significant change.   Chaperone was present for exam.    ASSESSMENT Bleeding from her cervical stump, normal pap. Endocervical polyp seen on ultrasound. Unable to remove, biopsy of tip of polyp sent to pathology    PLAN I will refer to GYN Oncology for further evaluation. I'm uncomfortable with the risk of perforating her upper cervix with further attempts.    An After Visit Summary  was printed and given to the patient.

## 2019-04-11 ENCOUNTER — Encounter: Payer: Self-pay | Admitting: Obstetrics and Gynecology

## 2019-04-11 ENCOUNTER — Ambulatory Visit (INDEPENDENT_AMBULATORY_CARE_PROVIDER_SITE_OTHER): Payer: Managed Care, Other (non HMO)

## 2019-04-11 ENCOUNTER — Ambulatory Visit: Payer: Managed Care, Other (non HMO) | Admitting: Obstetrics and Gynecology

## 2019-04-11 ENCOUNTER — Other Ambulatory Visit: Payer: Self-pay

## 2019-04-11 ENCOUNTER — Other Ambulatory Visit: Payer: Self-pay | Admitting: Obstetrics and Gynecology

## 2019-04-11 VITALS — BP 158/100 | HR 80 | Temp 97.1°F | Wt 327.0 lb

## 2019-04-11 DIAGNOSIS — N882 Stricture and stenosis of cervix uteri: Secondary | ICD-10-CM | POA: Diagnosis not present

## 2019-04-11 DIAGNOSIS — N8185 Cervical stump prolapse: Secondary | ICD-10-CM

## 2019-04-11 DIAGNOSIS — N939 Abnormal uterine and vaginal bleeding, unspecified: Secondary | ICD-10-CM

## 2019-04-11 DIAGNOSIS — N841 Polyp of cervix uteri: Secondary | ICD-10-CM

## 2019-04-23 ENCOUNTER — Other Ambulatory Visit: Payer: Self-pay | Admitting: Internal Medicine

## 2019-04-25 ENCOUNTER — Encounter: Payer: Self-pay | Admitting: Obstetrics and Gynecology

## 2019-04-27 ENCOUNTER — Telehealth: Payer: Self-pay | Admitting: Obstetrics and Gynecology

## 2019-04-27 NOTE — Telephone Encounter (Signed)
Sharyn Lull from [facility] called stating that Dr. Denman George reviewed the patient's referral and found that there was nothing oncology related. It was suggested that Dr. Talbert Nan "take the patient back to the OR." Sharyn Lull stated that she can be reached with any questions at [phone].  Cc: Rosa regarding referral

## 2019-05-01 NOTE — Telephone Encounter (Signed)
Please let the patient know that Dr Denman George recommended that I take the patient to the OR. She should have a hysteroscopy of her cervix under ultrasound guidance in the OR. She should come in the day prior for laminaria insertion.

## 2019-05-02 NOTE — Telephone Encounter (Signed)
Call to patient. Per DPR, can leave message on voice mail. ( Confirmed number on phone). Left message to call back to [facility].

## 2019-05-04 NOTE — Telephone Encounter (Signed)
Patient returned call

## 2019-05-04 NOTE — Telephone Encounter (Signed)
Call to patient. Left message to call back to [place] or [place].

## 2019-05-04 NOTE — Telephone Encounter (Signed)
Patient is returning call to Sally.  

## 2019-05-08 ENCOUNTER — Ambulatory Visit: Payer: Managed Care, Other (non HMO) | Admitting: Internal Medicine

## 2019-05-08 ENCOUNTER — Other Ambulatory Visit: Payer: Self-pay

## 2019-05-08 ENCOUNTER — Encounter: Payer: Self-pay | Admitting: Internal Medicine

## 2019-05-08 ENCOUNTER — Ambulatory Visit (HOSPITAL_COMMUNITY)
Admission: RE | Admit: 2019-05-08 | Discharge: 2019-05-08 | Disposition: A | Discharge status: Home / Self Care | Payer: Managed Care, Other (non HMO) | Source: Ambulatory Visit | Attending: Internal Medicine | Admitting: Internal Medicine

## 2019-05-08 VITALS — BP 146/98 | HR 63 | Temp 98.0°F | Ht 65.75 in | Wt 333.0 lb

## 2019-05-08 DIAGNOSIS — I10 Essential (primary) hypertension: Secondary | ICD-10-CM | POA: Diagnosis not present

## 2019-05-08 DIAGNOSIS — E039 Hypothyroidism, unspecified: Secondary | ICD-10-CM | POA: Diagnosis not present

## 2019-05-08 DIAGNOSIS — R0789 Other chest pain: Secondary | ICD-10-CM

## 2019-05-08 DIAGNOSIS — R202 Paresthesia of skin: Secondary | ICD-10-CM | POA: Diagnosis not present

## 2019-05-08 LAB — URINALYSIS
Hgb urine dipstick: NEGATIVE
Ketones, ur: NEGATIVE
Leukocytes,Ua: NEGATIVE
Nitrite: NEGATIVE
Specific Gravity, Urine: >=1.03 — AB (ref 1.000–1.030)
Total Protein, Urine: NEGATIVE
Urine Glucose: NEGATIVE
Urobilinogen, UA: 0.2 (ref 0.0–1.0)
pH: 6 (ref 5.0–8.0)

## 2019-05-08 LAB — CBC WITH DIFFERENTIAL/PLATELET
Basophils Absolute: 0 10*3/uL (ref 0.0–0.1)
Basophils Relative: 0.4 % (ref 0.0–3.0)
Eosinophils Absolute: 0.3 10*3/uL (ref 0.0–0.7)
Eosinophils Relative: 4.1 % (ref 0.0–5.0)
HCT: 39.9 % (ref 36.0–46.0)
Hemoglobin: 12.7 g/dL (ref 12.0–15.0)
Lymphocytes Relative: 33.1 % (ref 12.0–46.0)
Lymphs Abs: 2.4 10*3/uL (ref 0.7–4.0)
MCHC: 31.7 g/dL (ref 30.0–36.0)
MCV: 94.3 fl (ref 78.0–100.0)
Monocytes Absolute: 0.5 10*3/uL (ref 0.1–1.0)
Monocytes Relative: 6.3 % (ref 3.0–12.0)
Neutro Abs: 4.1 10*3/uL (ref 1.4–7.7)
Neutrophils Relative %: 56.1 % (ref 43.0–77.0)
Platelets: 239 10*3/uL (ref 150.0–400.0)
RBC: 4.23 Mil/uL (ref 3.87–5.11)
RDW: 14.1 % (ref 11.5–15.5)
WBC: 7.3 10*3/uL (ref 4.0–10.5)

## 2019-05-08 LAB — BASIC METABOLIC PANEL
BUN: 8 mg/dL (ref 6–23)
CO2: 30 mEq/L (ref 19–32)
Calcium: 9.6 mg/dL (ref 8.4–10.5)
Chloride: 104 mEq/L (ref 96–112)
Creatinine, Ser: 0.82 mg/dL (ref 0.40–1.20)
GFR: 84.83 mL/min (ref >60.00)
Glucose, Bld: 112 mg/dL — ABNORMAL HIGH (ref 70–99)
Potassium: 4.1 mEq/L (ref 3.5–5.1)
Sodium: 143 mEq/L (ref 135–145)

## 2019-05-08 LAB — VITAMIN B12: Vitamin B-12: 508 pg/mL (ref 211–911)

## 2019-05-08 LAB — T4, FREE: Free T4: 1.11 ng/dL (ref 0.60–1.60)

## 2019-05-08 LAB — TSH: TSH: 2.32 u[IU]/mL (ref 0.35–4.50)

## 2019-05-08 NOTE — Progress Notes (Signed)
Subjective:  Patient ID: Tracy Sampson, female    DOB: [birth year]  Age: 63 y.o. MRN: [id number]  CC: No chief complaint on file.   HPI Tracy Sampson presents for HTN, wt gain, allergies Pt had a breast exam and a mammo last mo BP ok at home  Outpatient Medications Prior to Visit  Medication Sig Dispense Refill  . ALPHAGAN P 0.1 % SOLN Place 1 drop into both eyes 2 (two) times daily.  0  . amLODipine (NORVASC) 5 MG tablet TAKE 1 TABLET(5 MG) BY MOUTH DAILY 90 tablet 0  . dorzolamide-timolol (COSOPT) 22.3-6.8 MG/ML ophthalmic solution     . ibuprofen (ADVIL,MOTRIN) 600 MG tablet Take 1 tablet (600 mg total) by mouth every 8 (eight) hours as needed. 30 tablet 1  . levothyroxine (SYNTHROID) 150 MCG tablet TAKE 1 TABLET(150 MCG) BY MOUTH DAILY BEFORE BREAKFAST 90 tablet 1  . loratadine (CLARITIN) 10 MG tablet Take 1 tablet (10 mg total) by mouth daily. 30 tablet 11  . losartan (COZAAR) 100 MG tablet Take 1 tablet (100 mg total) by mouth daily. 30 tablet 11  . mirabegron ER (MYRBETRIQ) 50 MG TB24 tablet Take 1 tablet (50 mg total) by mouth daily. 30 tablet 11  . oxybutynin (DITROPAN XL) 10 MG 24 hr tablet Take 1 tablet (10 mg total) by mouth at bedtime. 30 tablet 11  . pantoprazole (PROTONIX) 40 MG tablet Take 1 tablet (40 mg total) by mouth daily. 30 tablet 11  . sertraline (ZOLOFT) 100 MG tablet Take 1 tablet (100 mg total) by mouth daily. 90 tablet 3  . vitamin B-12 (CYANOCOBALAMIN) 500 MCG tablet Take 500 mcg by mouth daily.    [name] VITAMIN D PO Take by mouth. 1000 IU daily     No facility-administered medications prior to visit.    ROS: Review of Systems  Constitutional: Positive for fatigue and unexpected weight change. Negative for activity change, appetite change and chills.  HENT: Negative for congestion, mouth sores and sinus pressure.   Eyes: Negative for visual disturbance.  Respiratory: Negative for cough and chest tightness.   Gastrointestinal: Negative for  abdominal pain and nausea.  Genitourinary: Negative for difficulty urinating, frequency and vaginal pain.  Musculoskeletal: Negative for back pain and gait problem.  Skin: Negative for pallor and rash.  Neurological: Negative for dizziness, tremors, weakness, numbness and headaches.  Psychiatric/Behavioral: Negative for confusion and sleep disturbance.    Objective:  BP (!) 146/98 (BP Location: Left Arm, Patient Position: Sitting, Cuff Size: Large)   Pulse 63   Temp 98 F (36.7 C) (Oral)   Ht 5' 5.75" (1.67 m)   Wt (!) 333 lb (151 kg)   SpO2 94%   BMI 54.16 kg/m   BP Readings from Last 3 Encounters:  05/08/19 (!) 146/98  04/11/19 (!) 158/100  03/24/19 (!) 142/82    Wt Readings from Last 3 Encounters:  05/08/19 (!) 333 lb (151 kg)  04/11/19 (!) 327 lb (148.3 kg)  03/24/19 (!) 329 lb (149.2 kg)    Physical Exam Constitutional:      General: She is not in acute distress.    Appearance: She is well-developed. She is obese.  HENT:     Head: Normocephalic.     Right Ear: External ear normal.     Left Ear: External ear normal.     Nose: Nose normal.  Eyes:     General:        Right eye: No discharge.  Left eye: No discharge.     Conjunctiva/sclera: Conjunctivae normal.     Pupils: Pupils are equal, round, and reactive to light.  Neck:     Thyroid: No thyromegaly.     Vascular: No JVD.     Trachea: No tracheal deviation.  Cardiovascular:     Rate and Rhythm: Normal rate and regular rhythm.     Heart sounds: Normal heart sounds.  Pulmonary:     Effort: No respiratory distress.     Breath sounds: No stridor. No wheezing.  Abdominal:     General: Bowel sounds are normal. There is no distension.     Palpations: Abdomen is soft. There is no mass.     Tenderness: There is no abdominal tenderness. There is no guarding or rebound.  Musculoskeletal:        General: No tenderness.     Cervical back: Normal range of motion and neck supple.  Lymphadenopathy:      Cervical: No cervical adenopathy.  Skin:    Findings: No erythema or rash.  Neurological:     Cranial Nerves: No cranial nerve deficit.     Motor: No abnormal muscle tone.     Coordination: Coordination normal.     Deep Tendon Reflexes: Reflexes normal.  Psychiatric:        Behavior: Behavior normal.        Thought Content: Thought content normal.        Judgment: Judgment normal.     Lab Results  Component Value Date   WBC 7.1 10/27/2018   HGB 12.7 10/27/2018   HCT 39.4 10/27/2018   PLT 259.0 10/27/2018   GLUCOSE 108 (H) 10/27/2018   CHOL 191 10/27/2018   TRIG 109.0 10/27/2018   HDL 40.70 10/27/2018   LDLDIRECT 149.3 10/23/2010   LDLCALC 128 (H) 10/27/2018   ALT 21 10/27/2018   AST 19 10/27/2018   NA 144 10/27/2018   K 3.7 10/27/2018   CL 108 10/27/2018   CREATININE 0.79 10/27/2018   BUN 14 10/27/2018   CO2 25 10/27/2018   TSH 1.39 10/27/2018   HGBA1C 5.7 05/09/2018    No results found.  Assessment & Plan:   There are no diagnoses linked to this encounter.   No orders of the defined types were placed in this encounter.    Follow-up: No follow-ups on file.  Walker Kehr, MD

## 2019-05-08 NOTE — Patient Instructions (Signed)

## 2019-05-10 ENCOUNTER — Encounter

## 2019-05-17 ENCOUNTER — Telehealth: Payer: Self-pay | Admitting: Internal Medicine

## 2019-05-17 NOTE — Telephone Encounter (Signed)
Pt called once again stating that she called early this morning but she still has yet to receive a call back. Pt requests call back today.

## 2019-05-17 NOTE — Telephone Encounter (Signed)
Patient called and would like a call back regarding her lab results from her last visit. Please call patient back, thanks.

## 2019-05-17 NOTE — Telephone Encounter (Signed)
[place]  Pt called early this am, returning call back as she is hurting and sorer and it gets worse every day vs better pls FU

## 2019-05-18 NOTE — Telephone Encounter (Signed)
Spoke with pt yesterday and she stated she is hurting and sore and it gets worse every day vs better   Please advise. Would you like to see pt?

## 2019-05-18 NOTE — Telephone Encounter (Signed)
OK to close encounter or is further follow up necessary? Thanks!

## 2019-05-22 ENCOUNTER — Telehealth: Payer: Self-pay | Admitting: Obstetrics and Gynecology

## 2019-05-22 NOTE — Telephone Encounter (Signed)
See next phone encounter for completion of surgery information

## 2019-05-22 NOTE — Telephone Encounter (Signed)
Copied from previous phone encounter- have missed patient several times in attempts to discuss surgery recommendations from Dr Talbert Nan:  Please let the patient know that Dr Denman George recommended that I take the patient to the OR. She should have a hysteroscopy of her cervix under ultrasound guidance in the OR. She should come in the day prior for laminaria insertion.

## 2019-05-22 NOTE — Telephone Encounter (Signed)
Call placed to patient to review benefit for recommended surgery. Left voicemail message requesting a return call   cc: Reesa Chew, RN        Lamont Snowball, RN

## 2019-05-23 NOTE — Telephone Encounter (Signed)
Call to patient. Left message to call back. Ask for Tracy Sampson or Tracy Sampson

## 2019-05-23 NOTE — Telephone Encounter (Signed)
Yes, I should see her  Thx

## 2019-05-23 NOTE — Telephone Encounter (Signed)
Left pt vm to call back to schedule appt

## 2019-06-05 ENCOUNTER — Other Ambulatory Visit (HOSPITAL_COMMUNITY): Payer: Self-pay | Admitting: Obstetrics and Gynecology

## 2019-06-05 ENCOUNTER — Other Ambulatory Visit: Payer: Self-pay | Admitting: Obstetrics and Gynecology

## 2019-06-05 DIAGNOSIS — N841 Polyp of cervix uteri: Secondary | ICD-10-CM

## 2019-06-05 NOTE — Telephone Encounter (Signed)
Left message to call [place] at [phone].  Patient is scheduled for surgery on 07/25/2019 at 53 at [facility].

## 2019-06-05 NOTE — Telephone Encounter (Signed)
Patient returned call. Please call cell.

## 2019-06-05 NOTE — Telephone Encounter (Signed)
Left message to call Giannina Bartolome at [phone]. 

## 2019-06-05 NOTE — Telephone Encounter (Signed)
Spoke with patient. Patient is ready to proceed with surgery scheduling. Patient would like to proceed on 2/23, 3/2, or 3/9. Aware I will need to review availability at [facility] on these days and return call with date that we can proceed. Patient is agreeable.

## 2019-06-07 NOTE — Telephone Encounter (Signed)
Left message to call Walton Digilio at [phone]. 

## 2019-06-08 NOTE — Telephone Encounter (Signed)
Patient returning call to Kaitlyn. °

## 2019-06-15 NOTE — Telephone Encounter (Signed)
Left message to call [place] at [phone].  Advised patient may ask to speak with Gay Filler as well if I am unavailable or out of the office as patient and I have been unable to connect regarding surgery.

## 2019-06-20 NOTE — Telephone Encounter (Signed)
Spoke with patient regarding surgery benefits. Patient acknowledges understanding of information presented. Patient is aware that benefits presented are for professional benefits only. Patient is aware that once surgery is scheduled, the hospital will call with separate benefits. Patient is aware of surgery cancellation policy.  Patient to call back next Friday, (06/30/2019) to proceed with scheduling surgery.

## 2019-06-20 NOTE — Telephone Encounter (Signed)
Routing to [employer], RN for surgery planning.

## 2019-06-20 NOTE — Telephone Encounter (Signed)
Patient states she is returning call to the office.

## 2019-06-22 ENCOUNTER — Other Ambulatory Visit: Payer: Self-pay | Admitting: Internal Medicine

## 2019-06-23 NOTE — Telephone Encounter (Signed)
Patient calling to check the status of this refill. States that she is completely out of this medication.

## 2019-06-28 NOTE — Telephone Encounter (Signed)
Left message to call Alyrica Thurow at [phone]. 

## 2019-06-29 NOTE — Telephone Encounter (Signed)
Spoke with patient. Patient would like return call at 4:15 pm today.

## 2019-06-29 NOTE — Telephone Encounter (Signed)
Left message to call Aleksandar Duve at [phone]. 

## 2019-06-30 NOTE — Telephone Encounter (Signed)
Spoke with patient. Surgery is scheduled for 07/25/2019 at [place] am. Pre op appointment scheduled 07/11/2019 at 8 am. COVID test scheduled for 07/21/2019 at 11:25 am. Post op appointment scheduled for 08/10/2019 at 10 am. Patient is agreeable to all dates and times. Surgery instructions reviewed. Folder to CMA to provide surgery instruction sheet to patient at her pre op appointment.  Routing to provider and will close encounter.

## 2019-07-04 ENCOUNTER — Telehealth: Payer: Self-pay | Admitting: Obstetrics and Gynecology

## 2019-07-04 NOTE — Telephone Encounter (Signed)
Spoke back with pt. Pt given recommendations to proceed with Covid vaccine per [facility] and Dr Talbert Nan. Pt agreeable. Will try to schedule by March 3rd at the latest due to surgical procedure on 3/9. Pt verbalized understanding.   Routing to Dr Talbert Nan for review and will close encounter.

## 2019-07-04 NOTE — Telephone Encounter (Signed)
Patient has surgery scheduled next month and would like to know if she can go ahead and get the covid vaccine or if she should wait until after the surgery.

## 2019-07-04 NOTE — Telephone Encounter (Signed)
Left message for pt to call and speak with Colletta [place], RN

## 2019-07-05 ENCOUNTER — Other Ambulatory Visit: Payer: Self-pay | Admitting: Internal Medicine

## 2019-07-11 ENCOUNTER — Encounter: Payer: Self-pay | Admitting: Obstetrics and Gynecology

## 2019-07-11 ENCOUNTER — Ambulatory Visit (INDEPENDENT_AMBULATORY_CARE_PROVIDER_SITE_OTHER): Payer: Managed Care, Other (non HMO) | Admitting: Obstetrics and Gynecology

## 2019-07-11 ENCOUNTER — Other Ambulatory Visit: Payer: Self-pay

## 2019-07-11 VITALS — BP 142/72 | HR 89 | Temp 97.9°F | Ht 66.0 in | Wt 331.0 lb

## 2019-07-11 DIAGNOSIS — N841 Polyp of cervix uteri: Secondary | ICD-10-CM

## 2019-07-11 DIAGNOSIS — N950 Postmenopausal bleeding: Secondary | ICD-10-CM

## 2019-07-11 DIAGNOSIS — Z90711 Acquired absence of uterus with remaining cervical stump: Secondary | ICD-10-CM

## 2019-07-11 MED ORDER — MISOPROSTOL 200 MCG PO TABS: ORAL_TABLET | ORAL | 0 refills | Status: DC

## 2019-07-11 NOTE — Progress Notes (Signed)
GYNECOLOGY  VISIT   HPI: 64 y.o.   Single Black or African American Not Hispanic or Latino  female   G0P0000 with No LMP recorded. Patient has had a hysterectomy.   here for pre op visit before  Hysteroscopy of cervix with ultrasound guidance on 07/25/19. The patient has a h/o a supracervical hysterectomy. She presented at the end of last year with c/o vaginal bleeding. Ultrasound showed what appeared to be a polyp in her cervix. Her cervix was stenotic. Unable to remove the polyp in the office.    Pap from 03/24/19 was negative.   GYNECOLOGIC HISTORY: No LMP recorded. Patient has had a hysterectomy. Contraception:none  Menopausal hormone therapy: none         OB History    Gravida  0   Para  0   Term  0   Preterm  0   AB  0   Living  0     SAB  0   TAB  0   Ectopic  0   Multiple  0   Live Births                 Patient Active Problem List   Diagnosis Date Noted  . Shoulder pain, right 11/15/2017  . OAB (overactive bladder) 11/15/2017  . Contact dermatitis 07/07/2017  . Head congestion 07/07/2017  . Subconjunctival hemorrhage of right eye 12/04/2016  . Incarcerated ventral hernia 01/07/2016  . Left ear pain 06/06/2015  . Rectal bleeding 02/06/2015  . Blood in stool 12/04/2014  . Body odor 12/04/2014  . Umbilical hernia [id number]  . Incontinence in female 08/16/2013  . Trigger finger, acquired 08/16/2013  . Well adult exam 01/11/2012  . Nocturia 02/27/2011  . Paresthesia 10/23/2010  . Ventral hernia 03/01/2009  . Vitamin D deficiency 08/28/2008  . KNEE PAIN 08/28/2008  . OBESITY, MORBID 09/23/2007  . RASH AND OTHER NONSPECIFIC SKIN ERUPTION 09/07/2007  . Hypothyroidism 05/10/2007  . Depression 05/03/2007  . Essential hypertension 05/03/2007  . Allergic rhinitis 05/03/2007  . G E R D 05/03/2007  . OSA on CPAP 05/03/2007  . SUBMUCOUS LEIOMYOMA OF UTERUS 05/02/2007  . GOITER, MULTINODULAR 05/02/2007  . DYSPHAGIA UNSPECIFIED 05/02/2007    Past  Medical History:  Diagnosis Date  . Allergic rhinitis   . Allergy   . Arthritis   . Cataract    bilateral removed  . Depression   . GERD (gastroesophageal reflux disease)   . Glaucoma   . Hypertension   . Hypothyroidism   . Morbid obesity ([place])   . OSA (obstructive sleep apnea)    Dr Joya Gaskins  . Sleep apnea    wears cpap  . Thyroid disease   . Urinary incontinence     Past Surgical History:  Procedure Laterality Date  . abdominal supracervical hysterectomy with bilateral salpingo-oophorectomy     fibroid uterus  . ANKLE SURGERY  4/10   rt  . BREAST REDUCTION SURGERY    . CATARACT EXTRACTION    . COLONOSCOPY    . HERNIA REPAIR  0000000   umbillical  . INSERTION OF MESH N/A 01/07/2016   Procedure: INSERTION OF MESH;  Surgeon: Coralie Keens, MD;  Location: [facility];  Service: General;  Laterality: N/A;  . REDUCTION MAMMAPLASTY    . VENTRAL HERNIA REPAIR N/A 01/07/2016   Procedure: VENTRAL HERNIA REPAIR;  Surgeon: Coralie Keens, MD;  Location: [facility];  Service: General;  Laterality: N/A;    Current Outpatient Medications  Medication Sig Dispense  Refill  . ALPHAGAN P 0.1 % SOLN Place 1 drop into both eyes 2 (two) times daily.  0  . amLODipine (NORVASC) 5 MG tablet TAKE 1 TABLET(5 MG) BY MOUTH DAILY 90 tablet 0  . dorzolamide-timolol (COSOPT) 22.3-6.8 MG/ML ophthalmic solution     . ibuprofen (ADVIL,MOTRIN) 600 MG tablet Take 1 tablet (600 mg total) by mouth every 8 (eight) hours as needed. 30 tablet 1  . levothyroxine (SYNTHROID) 150 MCG tablet TAKE 1 TABLET(150 MCG) BY MOUTH DAILY BEFORE BREAKFAST 90 tablet 1  . loratadine (CLARITIN) 10 MG tablet Take 1 tablet (10 mg total) by mouth daily. 30 tablet 11  . losartan (COZAAR) 100 MG tablet Take 1 tablet (100 mg total) by mouth daily. 30 tablet 11  . mirabegron ER (MYRBETRIQ) 50 MG TB24 tablet Take 1 tablet (50 mg total) by mouth daily. 30 tablet 11  . oxybutynin (DITROPAN XL) 10 MG 24 hr tablet Take 1 tablet (10 mg total) by  mouth at bedtime. 30 tablet 11  . pantoprazole (PROTONIX) 40 MG tablet Take 1 tablet (40 mg total) by mouth daily. 30 tablet 11  . sertraline (ZOLOFT) 100 MG tablet TAKE 1 TABLET(100 MG) BY MOUTH DAILY 90 tablet 3  . vitamin B-12 (CYANOCOBALAMIN) 500 MCG tablet Take 500 mcg by mouth daily.    [name] VITAMIN D PO Take by mouth. 1000 IU daily     No current facility-administered medications for this visit.     ALLERGIES: Tribenzor [olmesartan-amlodipine-hctz]  Family History  Problem Relation Age of Onset  . Breast cancer Mother   . Cancer Mother        breast  . Hypertension Mother   . Colon cancer Maternal Grandmother   . Cancer Maternal Grandmother        colon    Social History   Socioeconomic History  . Marital status: Single    Spouse name: Not on file  . Number of children: Not on file  . Years of education: Not on file  . Highest education level: Not on file  Occupational History  . Occupation: [employer]    Comment: used to work in Therapist, art  Tobacco Use  . Smoking status: Never Smoker  . Smokeless tobacco: Never Used  Substance and Sexual Activity  . Alcohol use: No    Alcohol/week: 0.0 standard drinks  . Drug use: No  . Sexual activity: Not Currently    Partners: Male    Birth control/protection: Surgical  Other Topics Concern  . Not on file  Social History Narrative  . Not on file   Social Determinants of Health   Financial Resource Strain:   . Difficulty of Paying Living Expenses: Not on file  Food Insecurity:   . Worried About Charity fundraiser in the Last Year: Not on file  . Ran Out of Food in the Last Year: Not on file  Transportation Needs:   . Lack of Transportation (Medical): Not on file  . Lack of Transportation (Non-Medical): Not on file  Physical Activity:   . Days of Exercise per Week: Not on file  . Minutes of Exercise per Session: Not on file  Stress:   . Feeling of Stress : Not on file  Social Connections:   . Frequency of  Communication with Friends and Family: Not on file  . Frequency of Social Gatherings with Friends and Family: Not on file  . Attends Religious Services: Not on file  . Active Member of Clubs or Organizations:  Not on file  . Attends Archivist Meetings: Not on file  . Marital Status: Not on file  Intimate Partner Violence:   . Fear of Current or Ex-Partner: Not on file  . Emotionally Abused: Not on file  . Physically Abused: Not on file  . Sexually Abused: Not on file    Review of Systems  All other systems reviewed and are negative.   PHYSICAL EXAMINATION:    There were no vitals taken for this visit.    General appearance: alert, cooperative and appears stated age Neck: no adenopathy, supple, symmetrical, trachea midline and thyroid normal to inspection and palpation Heart: regular rate and rhythm Lungs: CTAB Abdomen: soft, non-tender; bowel sounds normal; no masses,  no organomegaly. Large vertical scar from prior hysterectomy and ventral hernia repair Extremities: normal, atraumatic, no cyanosis Skin: normal color, texture and turgor, no rashes or lesions Lymph: normal cervical supraclavicular and inguinal nodes Neurologic: grossly normal   ASSESSMENT Postmenopausal bleeding H/O supracervical hysterectomy Intracervical abnormality on ultrasound, c/w polyp.  Cervical stenosis    PLAN Will plan hysteroscopy of her cervix under ultrasound guidance Will pre-treat with cytotec Discussed the risk of perforation of the cervical stump, risk of damage to bowel or bladder. Possible need for laparoscopy or cystoscopy if perforation were to occur.    An After Visit Summary was printed and given to the patient.

## 2019-07-11 NOTE — Patient Instructions (Signed)
Hysteroscopy °Hysteroscopy is a procedure that is used to examine the inside of a woman's womb (uterus). This may be done for various reasons, including: °· To look for lumps (tumors) and other growths in the uterus. °· To evaluate abnormal bleeding, fibroid tumors, polyps, scar tissue (adhesions), or cancer of the uterus. °· To determine the cause of an inability to get pregnant (infertility) or repeated losses of pregnancies (miscarriages). °· To find a lost IUD (intrauterine device). °· To perform a procedure that permanently prevents pregnancy (sterilization). °During this procedure, a thin, flexible tube with a small light and camera (hysteroscope) is used to examine the uterus. The camera sends images to a monitor in the room so that your health care provider can view the inside of your uterus. A hysteroscopy should be done right after a menstrual period to make sure that you are not pregnant. °Tell a health care provider about: °· Any allergies you have. °· All medicines you are taking, including vitamins, herbs, eye drops, creams, and over-the-counter medicines. °· Any problems you or family members have had with the use of anesthetic medicines. °· Any blood disorders you have. °· Any surgeries you have had. °· Any medical conditions you have. °· Whether you are pregnant or may be pregnant. °What are the risks? °Generally, this is a safe procedure. However, problems may occur, including: °· Excessive bleeding. °· Infection. °· Damage to the uterus or other structures or organs. °· Allergic reaction to medicines or fluids that are used in the procedure. °What happens before the procedure? °Staying hydrated °Follow instructions from your health care provider about hydration, which may include: °· Up to 2 hours before the procedure - you may continue to drink clear liquids, such as water, clear fruit juice, black coffee, and plain tea. °Eating and drinking restrictions °Follow instructions from your health care  provider about eating and drinking, which may include: °· 8 hours before the procedure - stop eating solid foods and drink clear liquids only °· 2 hours before the procedure - stop drinking clear liquids. °General instructions °· Ask your health care provider about: °? Changing or stopping your normal medicines. This is important if you take diabetes medicines or blood thinners. °? Taking medicines such as aspirin and ibuprofen. These medicines can thin your blood and cause bleeding. Do not take these medicines for 1 week before your procedure, or as told by your health care provider. °· Do not use any products that contain nicotine or tobacco for 2 weeks before the procedure. This includes cigarettes and e-cigarettes. If you need help quitting, ask your health care provider. °· Medicine may be placed in your cervix the day before the procedure. This medicine causes the cervix to have a larger opening (dilate). The larger opening makes it easier for the hysteroscope to be inserted into the uterus during the procedure. °· Plan to have someone with you for the first 24-48 hours after the procedure, especially if you are given a medicine to make you fall asleep (general anesthetic). °· Plan to have someone take you home from the hospital or clinic. °What happens during the procedure? °· To lower your risk of infection: °? Your health care team will wash or sanitize their hands. °? Your skin will be washed with soap. °? Hair may be removed from the surgical area. °· An IV tube will be inserted into one of your veins. °· You may be given one or more of the following: °? A medicine to help   you relax (sedative). °? A medicine that numbs the area around the cervix (local anesthetic). °? A medicine to make you fall asleep (general anesthetic). °· A hysteroscope will be inserted through your vagina and into your uterus. °· Air or fluid will be used to enlarge your uterus, enabling your health care provider to see your uterus  better. The amount of fluid used will be carefully checked throughout the procedure. °· In some cases, tissue may be gently scraped from inside the uterus and sent to a lab for testing (biopsy). °The procedure may vary among health care providers and hospitals. °What happens after the procedure? °· Your blood pressure, heart rate, breathing rate, and blood oxygen level will be monitored until the medicines you were given have worn off. °· You may have some cramping. You may be given medicines for this. °· You may have bleeding, which varies from light spotting to menstrual-like bleeding. This is normal. °· If you had a biopsy done, it is your responsibility to get the results of your procedure. Ask your health care provider, or the department performing the procedure, when your results will be ready. °Summary °· Hysteroscopy is a procedure that is used to examine the inside of a woman's womb (uterus). °· After the procedure, you may have bleeding, which varies from light spotting to menstrual-like bleeding. This is normal. You may also have cramping. °· Plan to have someone take you home from the hospital or clinic. °This information is not intended to replace advice given to you by your health care provider. Make sure you discuss any questions you have with your health care provider. °Document Revised: 04/16/2017 Document Reviewed: 06/02/2016 °Elsevier Patient Education © 2020 [employer] ° °

## 2019-07-11 NOTE — H&P (Signed)
GYNECOLOGY  VISIT   HPI: 64 y.o.   Single Black or African American Not Hispanic or Latino  female   G0P0000 with No LMP recorded. Patient has had a hysterectomy.   here for pre op visit before  Hysteroscopy of cervix with ultrasound guidance on 07/25/19. The patient has a h/o a supracervical hysterectomy. She presented at the end of last year with c/o vaginal bleeding. Ultrasound showed what appeared to be a polyp in her cervix. Her cervix was stenotic. Unable to remove the polyp in the office.    Pap from 03/24/19 was negative.   GYNECOLOGIC HISTORY: No LMP recorded. Patient has had a hysterectomy. Contraception:hysterectomy Menopausal hormone therapy: none        OB History    Gravida  0   Para  0   Term  0   Preterm  0   AB  0   Living  0     SAB  0   TAB  0   Ectopic  0   Multiple  0   Live Births                 Patient Active Problem List   Diagnosis Date Noted  . Postmenopausal bleeding 07/11/2019  . Shoulder pain, right 11/15/2017  . OAB (overactive bladder) 11/15/2017  . Contact dermatitis 07/07/2017  . Head congestion 07/07/2017  . Subconjunctival hemorrhage of right eye 12/04/2016  . Incarcerated ventral hernia 01/07/2016  . Left ear pain 06/06/2015  . Rectal bleeding 02/06/2015  . Blood in stool 12/04/2014  . Body odor 12/04/2014  . Umbilical hernia [id number]  . Incontinence in female 08/16/2013  . Trigger finger, acquired 08/16/2013  . Well adult exam 01/11/2012  . Nocturia 02/27/2011  . Paresthesia 10/23/2010  . Ventral hernia 03/01/2009  . Vitamin D deficiency 08/28/2008  . KNEE PAIN 08/28/2008  . OBESITY, MORBID 09/23/2007  . RASH AND OTHER NONSPECIFIC SKIN ERUPTION 09/07/2007  . Hypothyroidism 05/10/2007  . Depression 05/03/2007  . Essential hypertension 05/03/2007  . Allergic rhinitis 05/03/2007  . G E R D 05/03/2007  . OSA on CPAP 05/03/2007  . SUBMUCOUS LEIOMYOMA OF UTERUS 05/02/2007  . GOITER, MULTINODULAR 05/02/2007  .  DYSPHAGIA UNSPECIFIED 05/02/2007    Past Medical History:  Diagnosis Date  . Allergic rhinitis   . Allergy   . Arthritis   . Cataract    bilateral removed  . Depression   . GERD (gastroesophageal reflux disease)   . Glaucoma   . Hypertension   . Hypothyroidism   . Morbid obesity ([place])   . OSA (obstructive sleep apnea)    Dr Joya Gaskins  . Sleep apnea    wears cpap  . Thyroid disease   . Urinary incontinence     Past Surgical History:  Procedure Laterality Date  . abdominal supracervical hysterectomy with bilateral salpingo-oophorectomy     fibroid uterus  . ANKLE SURGERY  4/10   rt  . BREAST REDUCTION SURGERY    . CATARACT EXTRACTION    . COLONOSCOPY    . HERNIA REPAIR  0000000   umbillical  . INSERTION OF MESH N/A 01/07/2016   Procedure: INSERTION OF MESH;  Surgeon: Coralie Keens, MD;  Location: [facility];  Service: General;  Laterality: N/A;  . REDUCTION MAMMAPLASTY    . VENTRAL HERNIA REPAIR N/A 01/07/2016   Procedure: VENTRAL HERNIA REPAIR;  Surgeon: Coralie Keens, MD;  Location: [facility];  Service: General;  Laterality: N/A;    No current facility-administered medications  for this encounter.   Current Outpatient Medications  Medication Sig Dispense Refill  . ALPHAGAN P 0.1 % SOLN Place 1 drop into both eyes 2 (two) times daily.  0  . amLODipine (NORVASC) 5 MG tablet TAKE 1 TABLET(5 MG) BY MOUTH DAILY 90 tablet 0  . dorzolamide-timolol (COSOPT) 22.3-6.8 MG/ML ophthalmic solution     . ibuprofen (ADVIL,MOTRIN) 600 MG tablet Take 1 tablet (600 mg total) by mouth every 8 (eight) hours as needed. 30 tablet 1  . levothyroxine (SYNTHROID) 150 MCG tablet TAKE 1 TABLET(150 MCG) BY MOUTH DAILY BEFORE BREAKFAST 90 tablet 1  . loratadine (CLARITIN) 10 MG tablet Take 1 tablet (10 mg total) by mouth daily. 30 tablet 11  . losartan (COZAAR) 100 MG tablet Take 1 tablet (100 mg total) by mouth daily. 30 tablet 11  . mirabegron ER (MYRBETRIQ) 50 MG TB24 tablet Take 1 tablet (50 mg  total) by mouth daily. 30 tablet 11  . misoprostol (CYTOTEC) 200 MCG tablet Place 2 tablets vaginally the night prior to surgery. 2 tablet 0  . oxybutynin (DITROPAN XL) 10 MG 24 hr tablet Take 1 tablet (10 mg total) by mouth at bedtime. 30 tablet 11  . pantoprazole (PROTONIX) 40 MG tablet Take 1 tablet (40 mg total) by mouth daily. 30 tablet 11  . sertraline (ZOLOFT) 100 MG tablet TAKE 1 TABLET(100 MG) BY MOUTH DAILY 90 tablet 3  . vitamin B-12 (CYANOCOBALAMIN) 500 MCG tablet Take 500 mcg by mouth daily.    [name] VITAMIN D PO Take by mouth. 1000 IU daily       ALLERGIES: Tribenzor [olmesartan-amlodipine-hctz]  Family History  Problem Relation Age of Onset  . Breast cancer Mother   . Cancer Mother        breast  . Hypertension Mother   . Colon cancer Maternal Grandmother   . Cancer Maternal Grandmother        colon    Social History   Socioeconomic History  . Marital status: Single    Spouse name: Not on file  . Number of children: Not on file  . Years of education: Not on file  . Highest education level: Not on file  Occupational History  . Occupation: [employer]    Comment: used to work in Therapist, art  Tobacco Use  . Smoking status: Never Smoker  . Smokeless tobacco: Never Used  Substance and Sexual Activity  . Alcohol use: No    Alcohol/week: 0.0 standard drinks  . Drug use: No  . Sexual activity: Not Currently    Partners: Male    Birth control/protection: Surgical  Other Topics Concern  . Not on file  Social History Narrative  . Not on file   Social Determinants of Health   Financial Resource Strain:   . Difficulty of Paying Living Expenses: Not on file  Food Insecurity:   . Worried About Charity fundraiser in the Last Year: Not on file  . Ran Out of Food in the Last Year: Not on file  Transportation Needs:   . Lack of Transportation (Medical): Not on file  . Lack of Transportation (Non-Medical): Not on file  Physical Activity:   . Days of Exercise per  Week: Not on file  . Minutes of Exercise per Session: Not on file  Stress:   . Feeling of Stress : Not on file  Social Connections:   . Frequency of Communication with Friends and Family: Not on file  . Frequency of Social Gatherings with  Friends and Family: Not on file  . Attends Religious Services: Not on file  . Active Member of Clubs or Organizations: Not on file  . Attends Archivist Meetings: Not on file  . Marital Status: Not on file  Intimate Partner Violence:   . Fear of Current or Ex-Partner: Not on file  . Emotionally Abused: Not on file  . Physically Abused: Not on file  . Sexually Abused: Not on file    Review of Systems  All other systems reviewed and are negative.   PHYSICAL EXAMINATION:    There were no vitals taken for this visit.    General appearance: alert, cooperative and appears stated age Neck: no adenopathy, supple, symmetrical, trachea midline and thyroid normal to inspection and palpation Heart: regular rate and rhythm Lungs: CTAB Abdomen: soft, non-tender; bowel sounds normal; no masses,  no organomegaly. Large vertical scar from prior hysterectomy and ventral hernia repair Extremities: normal, atraumatic, no cyanosis Skin: normal color, texture and turgor, no rashes or lesions Lymph: normal cervical supraclavicular and inguinal nodes Neurologic: grossly normal   ASSESSMENT Postmenopausal bleeding H/O supracervical hysterectomy Intracervical abnormality on ultrasound, c/w polyp.  Cervical stenosis    PLAN Will plan hysteroscopy of her cervix under ultrasound guidance Will pre-treat with cytotec Discussed the risk of perforation of the cervical stump, risk of damage to bowel or bladder. Possible need for laparoscopy or cystoscopy if perforation were to occur.    An After Visit Summary was printed and given to the patient.

## 2019-07-20 NOTE — Progress Notes (Signed)
[facility] [id number] - [place], [place] - [address] AT [address] [place] [address] Phone: [phone] Fax: [phone]      Your procedure is scheduled on Tuesday July 25, 2019.  Report to [facility] Main Entrance "A" at 05:30 A.M., and check in at the Admitting office.  Call this number if you have problems the morning of surgery:  [phone]  Call [phone] if you have any questions prior to your surgery date Monday-Friday 8am-4pm    Remember:  Do not eat or drink after midnight the night before your surgery  You may drink clear liquids until 04:30 AM the morning of your surgery.   Clear liquids allowed are: Water, Non-Citrus Juices (without pulp), Carbonated Beverages, Clear Tea, Black Coffee Only, and Gatorade    Take these medicines the morning of surgery with A SIP OF WATER: levothyroxine (SYNTHROID)  misoprostol (CYTOTEC) sertraline (ZOLOFT)  As of today, STOP taking any Aspirin (unless otherwise instructed by your surgeon), Aleve, Naproxen, Ibuprofen, Motrin, Advil, Goody's, BC's, all herbal medications, fish oil, and all vitamins.    The Morning of Surgery  Do not wear jewelry, make-up or nail polish.  Do not wear lotions, powders, perfumes, or deodorant  Do not shave 48 hours prior to surgery.    Do not bring valuables to the hospital.  [facility] is not responsible for any belongings or valuables.  If you are a smoker, DO NOT Smoke 24 hours prior to surgery  If you wear a CPAP at night please bring your mask the morning of surgery   Remember that you must have someone to transport you home after your surgery, and remain with you for 24 hours if you are discharged the same day.   Please bring cases for contacts, glasses, hearing aids, dentures or bridgework because it cannot be worn into surgery.    Leave your suitcase in the car.  After surgery it may be brought to your room.  For patients  admitted to the hospital, discharge time will be determined by your treatment team.  Patients discharged the day of surgery will not be allowed to drive home.    Special instructions:   [facility]- Preparing For Surgery  Before surgery, you can play an important role. Because skin is not sterile, your skin needs to be as free of germs as possible. You can reduce the number of germs on your skin by washing with CHG (chlorahexidine gluconate) Soap before surgery.  CHG is an antiseptic cleaner which kills germs and bonds with the skin to continue killing germs even after washing.    Oral Hygiene is also important to reduce your risk of infection.  Remember - BRUSH YOUR TEETH THE MORNING OF SURGERY WITH YOUR REGULAR TOOTHPASTE  Please do not use if you have an allergy to CHG or antibacterial soaps. If your skin becomes reddened/irritated stop using the CHG.  Do not shave (including legs and underarms) for at least 48 hours prior to first CHG shower. It is OK to shave your face.  Please follow these instructions carefully.   1. Shower the NIGHT BEFORE SURGERY and the MORNING OF SURGERY with CHG Soap.   2. If you chose to wash your hair, wash your hair first as usual with your normal shampoo.  3. After you shampoo, rinse your hair and body thoroughly to remove the shampoo.  4. Use CHG as you would any other  liquid soap. You can apply CHG directly to the skin and wash gently with a scrungie or a clean washcloth.   5. Apply the CHG Soap to your body ONLY FROM THE NECK DOWN.  Do not use on open wounds or open sores. Avoid contact with your eyes, ears, mouth and genitals (private parts). Wash Face and genitals (private parts)  with your normal soap.   6. Wash thoroughly, paying special attention to the area where your surgery will be performed.  7. Thoroughly rinse your body with warm water from the neck down.  8. DO NOT shower/wash with your normal soap after using and rinsing off the CHG  Soap.  9. Pat yourself dry with a CLEAN TOWEL.  10. Wear CLEAN PAJAMAS to bed the night before surgery, wear comfortable clothes the morning of surgery  11. Place CLEAN SHEETS on your bed the night of your first shower and DO NOT SLEEP WITH PETS.    Day of Surgery:  Please shower the morning of surgery with the CHG soap Do not apply any deodorants/lotions. Please wear clean clothes to the hospital/surgery center.   Remember to brush your teeth WITH YOUR REGULAR TOOTHPASTE.   Please read over the following fact sheets that you were given.

## 2019-07-20 NOTE — Progress Notes (Signed)
Anesthesia PAT Evaluation:  Case: [id number] Date/Time: 07/25/19 0715   Procedures:      HYSTEROSCOPY OF CERVIX WITH ULTRASOUND GUIDANCE/KS (N/A )     OPERATIVE ULTRASOUND (N/A )   Anesthesia type: Choice   Pre-op diagnosis: Endocervical polyp   Location: [facility] OR ROOM 08 / [facility]   Surgeons: Salvadore Dom, MD      DISCUSSION: Patient is a 64 year old female scheduled for the above procedure. She has a history of supracervical hysterectomy/BOS (01/2006). Several months ago she presented with vaginal bleeding. [place] suggestive of a cervical polyp that was unable to be removed in the office.    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 BMI is consistent with morbid obesity.   Surgeon requested anesthesia consult due to OSA, obesity, GERD, dysphagia. Patient reports she is compliant with OSA. She is still working on someone to stay with her after surgery, so I did stress the importance of this otherwise surgery could be delayed. She denied SOB and chest pain. She primarily sits at work, but she is able to do activities like vacuuming without CV symptoms. Occasional pedal edema which she feels is stable. No orthopnea, syncope/presyncope, heart racing. Denied known DM or cardiac history. BP 160/90 at PAT, but she had not taken AM medications yet--reports her home BP readings are more ~ 140/80. She denied any personal or family history of anesthesia complications.   No acute symptoms reported at PAT. She continues to have some vaginal spotting, but no persistent heavy bleeding. EKG and labs appear acceptable for OR. atlhough 07/21/19 presurgical COVID-19 test is in process. If negative and otherwise no acute changes then I would anticipate that she may proceed as planned.     VS: BP (!) 160/90   Pulse 78   Temp 36.7 C    Resp 19   Ht 5\' 6"  (1.676 m)   Wt (!) 151.2 kg   SpO2 99%   BMI 53.80 kg/m  Heart RRR, no murmur noted. No carotid bruit noted. Lungs clear.    PROVIDERS: Plotnikov, Evie Lacks, MD is PCP  Kara Mead, MD is pulmonologist (for OSA). Last visit 05/09/18.    LABS: Labs reviewed: Acceptable for surgery. TSH normal at 2.32 on 05/08/19. Normal LFTs 10/27/18. Last A1c 5.7% 05/09/18.  (all labs ordered are listed, but only abnormal results are displayed)  Labs Reviewed  BASIC METABOLIC PANEL - Abnormal; Notable for the following components:      Result Value   Glucose, Bld 104 (*)    All other components within normal limits  CBC     IMAGES: Xray left ribs 2V 05/08/19: FINDINGS: Evaluation is limited due to body habitus and soft tissue attenuation. No definite displaced fractures identified. There is diffuse interstitial coarsening and mild bronchitic changes of the visualized lungs. The soft tissues are unremarkable. IMPRESSION: 1. No definite displaced rib fracture. 2. Diffuse interstitial coarsening and mild bronchitic changes of the visualized lungs.   EKG: 07/21/19: NSR - rSr pattern in V1-2, probable normal variant.    CV: N/A  Past Medical History:  Diagnosis Date  . Allergic rhinitis   . Allergy   . Arthritis   . Cataract    bilateral removed  . Depression   . GERD (gastroesophageal reflux disease)   . Glaucoma   . Hypertension   . Hypothyroidism   .  Morbid obesity ([place])   . OSA (obstructive sleep apnea)    Dr Joya Gaskins  . Sleep apnea    wears cpap  . Thyroid disease   . Urinary incontinence     Past Surgical History:  Procedure Laterality Date  . abdominal supracervical hysterectomy with bilateral salpingo-oophorectomy     fibroid uterus  . ANKLE SURGERY  4/10   rt  . BREAST REDUCTION SURGERY    . CATARACT EXTRACTION    . COLONOSCOPY    . HERNIA REPAIR  0000000   umbillical  . INSERTION OF MESH N/A 01/07/2016   Procedure: INSERTION OF MESH;   Surgeon: Coralie Keens, MD;  Location: [facility] ORS;  Service: General;  Laterality: N/A;  . REDUCTION MAMMAPLASTY    . VENTRAL HERNIA REPAIR N/A 01/07/2016   Procedure: VENTRAL HERNIA REPAIR;  Surgeon: Coralie Keens, MD;  Location: [facility] ORS;  Service: General;  Laterality: N/A;    MEDICATIONS: . amLODipine (NORVASC) 5 MG tablet  . cholecalciferol (VITAMIN D3) 25 MCG (1000 UNIT) tablet  . dorzolamide-timolol (COSOPT) 22.3-6.8 MG/ML ophthalmic solution  . ibuprofen (ADVIL,MOTRIN) 600 MG tablet  . levothyroxine (SYNTHROID) 150 MCG tablet  . loratadine (CLARITIN) 10 MG tablet  . losartan (COZAAR) 100 MG tablet  . mirabegron ER (MYRBETRIQ) 50 MG TB24 tablet  . misoprostol (CYTOTEC) 200 MCG tablet  . oxybutynin (DITROPAN XL) 10 MG 24 hr tablet  . pantoprazole (PROTONIX) 40 MG tablet  . sertraline (ZOLOFT) 100 MG tablet  . vitamin B-12 (CYANOCOBALAMIN) 500 MCG tablet   No current facility-administered medications for this encounter.    Myra Gianotti, PA-C Surgical Short Stay/Anesthesiology [facility] Phone [phone] [facility] Phone [phone] 07/21/2019 3:29 PM

## 2019-07-21 ENCOUNTER — Other Ambulatory Visit (HOSPITAL_COMMUNITY)
Admission: RE | Admit: 2019-07-21 | Discharge: 2019-07-21 | Disposition: A | Discharge status: Home / Self Care | Payer: Managed Care, Other (non HMO) | Source: Ambulatory Visit | Attending: Obstetrics and Gynecology | Admitting: Obstetrics and Gynecology

## 2019-07-21 ENCOUNTER — Other Ambulatory Visit: Payer: Self-pay

## 2019-07-21 ENCOUNTER — Encounter (HOSPITAL_COMMUNITY)
Admission: RE | Admit: 2019-07-21 | Discharge: 2019-07-21 | Disposition: A | Discharge status: Home / Self Care | Payer: Managed Care, Other (non HMO) | Source: Ambulatory Visit | Attending: Obstetrics and Gynecology | Admitting: Obstetrics and Gynecology

## 2019-07-21 ENCOUNTER — Encounter (HOSPITAL_COMMUNITY): Payer: Self-pay

## 2019-07-21 ENCOUNTER — Other Ambulatory Visit (HOSPITAL_COMMUNITY): Payer: Managed Care, Other (non HMO)

## 2019-07-21 DIAGNOSIS — Z20822 Contact with and (suspected) exposure to covid-19: Secondary | ICD-10-CM | POA: Insufficient documentation

## 2019-07-21 DIAGNOSIS — F329 Major depressive disorder, single episode, unspecified: Secondary | ICD-10-CM | POA: Diagnosis not present

## 2019-07-21 DIAGNOSIS — Z90711 Acquired absence of uterus with remaining cervical stump: Secondary | ICD-10-CM | POA: Insufficient documentation

## 2019-07-21 DIAGNOSIS — R9431 Abnormal electrocardiogram [ECG] [EKG]: Secondary | ICD-10-CM | POA: Diagnosis not present

## 2019-07-21 DIAGNOSIS — H409 Unspecified glaucoma: Secondary | ICD-10-CM | POA: Diagnosis not present

## 2019-07-21 DIAGNOSIS — Z79899 Other long term (current) drug therapy: Secondary | ICD-10-CM | POA: Diagnosis not present

## 2019-07-21 DIAGNOSIS — I10 Essential (primary) hypertension: Secondary | ICD-10-CM | POA: Insufficient documentation

## 2019-07-21 DIAGNOSIS — Z90722 Acquired absence of ovaries, bilateral: Secondary | ICD-10-CM | POA: Insufficient documentation

## 2019-07-21 DIAGNOSIS — G4733 Obstructive sleep apnea (adult) (pediatric): Secondary | ICD-10-CM | POA: Insufficient documentation

## 2019-07-21 DIAGNOSIS — M1990 Unspecified osteoarthritis, unspecified site: Secondary | ICD-10-CM | POA: Insufficient documentation

## 2019-07-21 DIAGNOSIS — Z79890 Hormone replacement therapy (postmenopausal): Secondary | ICD-10-CM | POA: Diagnosis not present

## 2019-07-21 DIAGNOSIS — Z01818 Encounter for other preprocedural examination: Secondary | ICD-10-CM | POA: Diagnosis not present

## 2019-07-21 DIAGNOSIS — E890 Postprocedural hypothyroidism: Secondary | ICD-10-CM | POA: Diagnosis not present

## 2019-07-21 DIAGNOSIS — N841 Polyp of cervix uteri: Secondary | ICD-10-CM | POA: Insufficient documentation

## 2019-07-21 DIAGNOSIS — K219 Gastro-esophageal reflux disease without esophagitis: Secondary | ICD-10-CM | POA: Insufficient documentation

## 2019-07-21 DIAGNOSIS — Z6841 Body Mass Index (BMI) 40.0 and over, adult: Secondary | ICD-10-CM | POA: Insufficient documentation

## 2019-07-21 LAB — BASIC METABOLIC PANEL
Anion gap: 12 (ref 5–15)
BUN: 9 mg/dL (ref 8–23)
CO2: 24 mmol/L (ref 22–32)
Calcium: 9.2 mg/dL (ref 8.9–10.3)
Chloride: 108 mmol/L (ref 98–111)
Creatinine, Ser: 0.81 mg/dL (ref 0.44–1.00)
GFR calc Af Amer: >60 mL/min (ref >60)
GFR calc non Af Amer: >60 mL/min (ref >60)
Glucose, Bld: 104 mg/dL — ABNORMAL HIGH (ref 70–99)
Potassium: 3.7 mmol/L (ref 3.5–5.1)
Sodium: 144 mmol/L (ref 135–145)

## 2019-07-21 LAB — CBC
HCT: 42.3 % (ref 36.0–46.0)
Hemoglobin: 12.9 g/dL (ref 12.0–15.0)
MCH: 30.3 pg (ref 26.0–34.0)
MCHC: 30.5 g/dL (ref 30.0–36.0)
MCV: 99.3 fL (ref 80.0–100.0)
Platelets: 254 10*3/uL (ref 150–400)
RBC: 4.26 MIL/uL (ref 3.87–5.11)
RDW: 13 % (ref 11.5–15.5)
WBC: 7.3 10*3/uL (ref 4.0–10.5)
nRBC: 0 % (ref 0.0–0.2)

## 2019-07-21 LAB — SARS CORONAVIRUS 2 (TAT 6-24 HRS): SARS Coronavirus 2: NEGATIVE

## 2019-07-21 NOTE — Anesthesia Preprocedure Evaluation (Addendum)
Anesthesia Evaluation  Patient identified by MRN, date of birth, ID band Patient awake    Reviewed: Allergy & Precautions, NPO status , Patient's Chart, lab work & pertinent test results  Airway Mallampati: II  TM Distance: >3 FB Neck ROM: Full    Dental no notable dental hx. (+) Teeth Intact, Dental Advisory Given   Pulmonary sleep apnea and Continuous Positive Airway Pressure Ventilation ,    Pulmonary exam normal breath sounds clear to auscultation       Cardiovascular hypertension, Pt. on medications and Pt. on home beta blockers Normal cardiovascular exam Rhythm:Regular Rate:Normal     Neuro/Psych negative neurological ROS  negative psych ROS   GI/Hepatic GERD  ,  Endo/Other  Hypothyroidism   Renal/GU      Musculoskeletal   Abdominal   Peds  Hematology   Anesthesia Other Findings   Reproductive/Obstetrics                            Anesthesia Physical Anesthesia Plan  ASA: III  Anesthesia Plan: General   Post-op Pain Management:    Induction: Intravenous  PONV Risk Score and Plan: 3 and Treatment may vary due to age or medical condition, Ondansetron and Dexamethasone  Airway Management Planned: Oral ETT  Additional Equipment: None  Intra-op Plan:   Post-operative Plan: Extubation in OR  Informed Consent: I have reviewed the patients History and Physical, chart, labs and discussed the procedure including the risks, benefits and alternatives for the proposed anesthesia with the patient or authorized representative who has indicated his/her understanding and acceptance.     Dental advisory given  Plan Discussed with: CRNA  Anesthesia Plan Comments: (PAT note written by Myra Gianotti, PA-C. )      Anesthesia Quick Evaluation

## 2019-07-21 NOTE — Progress Notes (Signed)
PCP - Lew Dawes Cardiologist - denies  Chest x-ray - n/a EKG - 07-21-19  SA - yes, wears CPAP   ERAS Protcol - yes, no drink ordered or given  COVID TEST- today, Friday 07-21-19   Anesthesia review: yes, Ebony Hail aware, coming to talk to patient after PAT appointment (per surgeon's request)  Patient denies shortness of breath, fever, cough and chest pain at PAT appointment   All instructions explained to the patient, with a verbal understanding of the material. Patient agrees to go over the instructions while at home for a better understanding. Patient also instructed to self quarantine after being tested for COVID-19. The opportunity to ask questions was provided.

## 2019-07-25 ENCOUNTER — Other Ambulatory Visit: Payer: Self-pay | Admitting: Internal Medicine

## 2019-07-25 ENCOUNTER — Encounter (HOSPITAL_COMMUNITY): Payer: Self-pay | Admitting: Obstetrics and Gynecology

## 2019-07-25 ENCOUNTER — Ambulatory Visit (HOSPITAL_COMMUNITY)
Admission: RE | Admit: 2019-07-25 | Discharge: 2019-07-25 | Disposition: A | Discharge status: Home / Self Care | Payer: Managed Care, Other (non HMO) | Attending: Obstetrics and Gynecology | Admitting: Obstetrics and Gynecology

## 2019-07-25 ENCOUNTER — Ambulatory Visit (HOSPITAL_COMMUNITY)
Admission: RE | Admit: 2019-07-25 | Discharge: 2019-07-25 | Disposition: A | Discharge status: Home / Self Care | Payer: Managed Care, Other (non HMO) | Source: Ambulatory Visit | Attending: Obstetrics and Gynecology | Admitting: Obstetrics and Gynecology

## 2019-07-25 ENCOUNTER — Ambulatory Visit (HOSPITAL_COMMUNITY): Payer: Managed Care, Other (non HMO) | Admitting: Anesthesiology

## 2019-07-25 ENCOUNTER — Other Ambulatory Visit: Payer: Self-pay

## 2019-07-25 ENCOUNTER — Encounter (HOSPITAL_COMMUNITY)
Admission: RE | Disposition: A | Discharge status: Home / Self Care | Payer: Self-pay | Source: Home / Self Care | Attending: Obstetrics and Gynecology

## 2019-07-25 ENCOUNTER — Ambulatory Visit (HOSPITAL_COMMUNITY): Payer: Managed Care, Other (non HMO) | Admitting: Vascular Surgery

## 2019-07-25 DIAGNOSIS — G4733 Obstructive sleep apnea (adult) (pediatric): Secondary | ICD-10-CM | POA: Insufficient documentation

## 2019-07-25 DIAGNOSIS — N841 Polyp of cervix uteri: Secondary | ICD-10-CM

## 2019-07-25 DIAGNOSIS — N882 Stricture and stenosis of cervix uteri: Secondary | ICD-10-CM | POA: Insufficient documentation

## 2019-07-25 DIAGNOSIS — E559 Vitamin D deficiency, unspecified: Secondary | ICD-10-CM | POA: Diagnosis not present

## 2019-07-25 DIAGNOSIS — Z79890 Hormone replacement therapy (postmenopausal): Secondary | ICD-10-CM | POA: Insufficient documentation

## 2019-07-25 DIAGNOSIS — E039 Hypothyroidism, unspecified: Secondary | ICD-10-CM | POA: Diagnosis not present

## 2019-07-25 DIAGNOSIS — Z79899 Other long term (current) drug therapy: Secondary | ICD-10-CM | POA: Diagnosis not present

## 2019-07-25 DIAGNOSIS — N3281 Overactive bladder: Secondary | ICD-10-CM | POA: Insufficient documentation

## 2019-07-25 DIAGNOSIS — N950 Postmenopausal bleeding: Secondary | ICD-10-CM

## 2019-07-25 DIAGNOSIS — K219 Gastro-esophageal reflux disease without esophagitis: Secondary | ICD-10-CM | POA: Insufficient documentation

## 2019-07-25 DIAGNOSIS — Z6841 Body Mass Index (BMI) 40.0 and over, adult: Secondary | ICD-10-CM | POA: Insufficient documentation

## 2019-07-25 DIAGNOSIS — Z888 Allergy status to other drugs, medicaments and biological substances status: Secondary | ICD-10-CM | POA: Diagnosis not present

## 2019-07-25 DIAGNOSIS — N8185 Cervical stump prolapse: Secondary | ICD-10-CM

## 2019-07-25 DIAGNOSIS — I10 Essential (primary) hypertension: Secondary | ICD-10-CM | POA: Diagnosis not present

## 2019-07-25 DIAGNOSIS — H409 Unspecified glaucoma: Secondary | ICD-10-CM | POA: Diagnosis not present

## 2019-07-25 DIAGNOSIS — Z8249 Family history of ischemic heart disease and other diseases of the circulatory system: Secondary | ICD-10-CM | POA: Diagnosis not present

## 2019-07-25 DIAGNOSIS — Z419 Encounter for procedure for purposes other than remedying health state, unspecified: Secondary | ICD-10-CM

## 2019-07-25 HISTORY — PX: HYSTEROSCOPY: SHX211

## 2019-07-25 HISTORY — PX: OPERATIVE ULTRASOUND: SHX5996

## 2019-07-25 SURGERY — HYSTEROSCOPY
Anesthesia: General | Site: Vagina

## 2019-07-25 MED ORDER — FENTANYL CITRATE (PF) 250 MCG/5ML IJ SOLN
INTRAMUSCULAR | Status: DC | PRN
  Administered 2019-07-25: 25 ug via INTRAVENOUS

## 2019-07-25 MED ORDER — MIDAZOLAM HCL 2 MG/2ML IJ SOLN
INTRAMUSCULAR | Status: AC
  Filled 2019-07-25: qty 2

## 2019-07-25 MED ORDER — OXYCODONE HCL 5 MG/5ML PO SOLN: 5.0000 mg | Freq: Once | ORAL | Status: DC | PRN

## 2019-07-25 MED ORDER — MIDAZOLAM HCL 2 MG/2ML IJ SOLN
INTRAMUSCULAR | Status: DC | PRN
  Administered 2019-07-25: 2 mg via INTRAVENOUS

## 2019-07-25 MED ORDER — HYDROMORPHONE HCL 1 MG/ML IJ SOLN: 0.2500 mg | INTRAMUSCULAR | Status: DC | PRN

## 2019-07-25 MED ORDER — DEXMEDETOMIDINE HCL IN NACL 200 MCG/50ML IV SOLN
INTRAVENOUS | Status: DC | PRN
  Administered 2019-07-25: 16 ug via INTRAVENOUS

## 2019-07-25 MED ORDER — OXYCODONE HCL 5 MG PO TABS: 5.0000 mg | ORAL_TABLET | Freq: Once | ORAL | Status: DC | PRN

## 2019-07-25 MED ORDER — EPHEDRINE SULFATE-NACL 50-0.9 MG/10ML-% IV SOSY
PREFILLED_SYRINGE | INTRAVENOUS | Status: DC | PRN
  Administered 2019-07-25 (×2): 10 mg via INTRAVENOUS

## 2019-07-25 MED ORDER — DIPHENHYDRAMINE HCL 50 MG/ML IJ SOLN
INTRAMUSCULAR | Status: DC | PRN
  Administered 2019-07-25: 12.5 mg via INTRAVENOUS

## 2019-07-25 MED ORDER — ONDANSETRON HCL 4 MG/2ML IJ SOLN
INTRAMUSCULAR | Status: AC
  Filled 2019-07-25: qty 2

## 2019-07-25 MED ORDER — LACTATED RINGERS IV SOLN: INTRAVENOUS | Status: DC

## 2019-07-25 MED ORDER — ONDANSETRON HCL 4 MG/2ML IJ SOLN: 4.0000 mg | Freq: Once | INTRAMUSCULAR | Status: DC | PRN

## 2019-07-25 MED ORDER — DEXAMETHASONE SODIUM PHOSPHATE 10 MG/ML IJ SOLN
INTRAMUSCULAR | Status: AC
  Filled 2019-07-25: qty 1

## 2019-07-25 MED ORDER — DIPHENHYDRAMINE HCL 50 MG/ML IJ SOLN
INTRAMUSCULAR | Status: AC
  Filled 2019-07-25: qty 1

## 2019-07-25 MED ORDER — GLYCOPYRROLATE PF 0.2 MG/ML IJ SOSY
PREFILLED_SYRINGE | INTRAMUSCULAR | Status: DC | PRN
  Administered 2019-07-25: .2 mg via INTRAVENOUS

## 2019-07-25 MED ORDER — LIDOCAINE 2% (20 MG/ML) 5 ML SYRINGE
INTRAMUSCULAR | Status: DC | PRN
  Administered 2019-07-25: 100 mg via INTRAVENOUS

## 2019-07-25 MED ORDER — LACTATED RINGERS IV SOLN: INTRAVENOUS | Status: DC | PRN

## 2019-07-25 MED ORDER — KETOROLAC TROMETHAMINE 30 MG/ML IJ SOLN: 30.0000 mg | Freq: Once | INTRAMUSCULAR | Status: AC | PRN

## 2019-07-25 MED ORDER — ONDANSETRON HCL 4 MG/2ML IJ SOLN
INTRAMUSCULAR | Status: DC | PRN
  Administered 2019-07-25: 4 mg via INTRAVENOUS

## 2019-07-25 MED ORDER — ACETAMINOPHEN 500 MG PO TABS
1000.0000 mg | ORAL_TABLET | Freq: Once | ORAL | Status: AC
  Administered 2019-07-25: 1000 mg via ORAL
  Filled 2019-07-25: qty 2

## 2019-07-25 MED ORDER — FENTANYL CITRATE (PF) 250 MCG/5ML IJ SOLN
INTRAMUSCULAR | Status: AC
  Filled 2019-07-25: qty 5

## 2019-07-25 MED ORDER — GLYCOPYRROLATE PF 0.2 MG/ML IJ SOSY
PREFILLED_SYRINGE | INTRAMUSCULAR | Status: AC
  Filled 2019-07-25: qty 1

## 2019-07-25 MED ORDER — EPHEDRINE 5 MG/ML INJ
INTRAVENOUS | Status: AC
  Filled 2019-07-25: qty 10

## 2019-07-25 MED ORDER — DEXAMETHASONE SODIUM PHOSPHATE 10 MG/ML IJ SOLN
INTRAMUSCULAR | Status: DC | PRN
  Administered 2019-07-25: 5 mg via INTRAVENOUS

## 2019-07-25 MED ORDER — PROPOFOL 10 MG/ML IV BOLUS
INTRAVENOUS | Status: AC
  Filled 2019-07-25: qty 20

## 2019-07-25 MED ORDER — DEXMEDETOMIDINE HCL IN NACL 200 MCG/50ML IV SOLN
INTRAVENOUS | Status: AC
  Filled 2019-07-25: qty 50

## 2019-07-25 MED ORDER — LIDOCAINE 2% (20 MG/ML) 5 ML SYRINGE
INTRAMUSCULAR | Status: AC
  Filled 2019-07-25: qty 5

## 2019-07-25 MED ORDER — PROPOFOL 10 MG/ML IV BOLUS
INTRAVENOUS | Status: DC | PRN
  Administered 2019-07-25: 200 mg via INTRAVENOUS

## 2019-07-25 MED ORDER — SODIUM CHLORIDE 0.9 % IR SOLN
Status: DC | PRN
  Administered 2019-07-25: 1000 mL

## 2019-07-25 SURGICAL SUPPLY — 10 items
DEVICE MYOSURE LITE (MISCELLANEOUS) ×3 IMPLANT
GLOVE BIOGEL PI IND STRL 7.0 (GLOVE) ×2 IMPLANT
GLOVE BIOGEL PI INDICATOR 7.0 (GLOVE) ×4
GLOVE ECLIPSE 6.5 STRL STRAW (GLOVE) ×3 IMPLANT
GOWN STRL REUS W/ TWL LRG LVL3 (GOWN DISPOSABLE) ×2 IMPLANT
GOWN STRL REUS W/TWL LRG LVL3 (GOWN DISPOSABLE) ×6
KIT PROCEDURE FLUENT (KITS) ×3 IMPLANT
PACK VAGINAL MINOR WOMEN LF (CUSTOM PROCEDURE TRAY) ×3 IMPLANT
PAD OB MATERNITY 4.3X12.25 (PERSONAL CARE ITEMS) ×3 IMPLANT
TOWEL GREEN STERILE FF (TOWEL DISPOSABLE) ×6 IMPLANT

## 2019-07-25 NOTE — Op Note (Signed)
Preoperative Diagnosis: Postmenopausal bleeding, cervical polyp, cervical stenosis, history of supracervical hysterectomy  Postoperative Diagnosis: Postmenopausal bleeding, cervical polyp, cervical stenosis, history of supracervical hysterectomy, cervical stump prolapse  Procedure: ultrasound, hysteroscopy, cervical polypectomy  Surgeon: Dr Sumner Boast  Assistants: None  Anesthesia: General via LMA  EBL: 5 cc  Fluids: 500 cc  Fluid deficit: 70 cc  Urine output: not catheterized  Indications for surgery: The patient is a 64 yo female, who presented with postmenopausal bleeding and a known history of a supracervical hysterectomy. Work up included a normal pap and an ultrasound that showed an endocervical polyp. Her cervix was stenotic in the office, unable to see or remove the polyp in the office.  The risks of the surgery were reviewed with the patient and the consent form was signed prior to her surgery. She was pretreated with cytotec for cervical stenosis.   Findings: with anesthesia the cervical stump was at the introitus. Ultrasound transabdominally with limited view. Hysteroscopy of the cervix: polyp noted. At the end of the procedure the cervical canal was free of polyp, there was no perforation of the cervical stump.   Specimens: endocervical polyp   Procedure: The patient was taken to the operating room with an IV in place. She was placed in the dorsal lithotomy position and anesthesia was administered. She was prepped and draped in the usual sterile fashion for a vaginal procedure. The cervix was noted to be at the introitus, no speculum was needed. A single tooth tenaculum was placed on the anterior lip of the cervix. The cervix was carefully dilated to a #7 hagar dilator. The myosure hysteroscope was inserted into the cervix. With continuous infusion of normal saline, the cervical canal was visualized with the above findings. The myosure light was used to resect the cervical  polyp. The myosure was then removed. The cervical canal appeared normal and was intact. The single tooth tenaculum were removed. Slight oozing was stopped with pressure. The patients perineum was cleansed of betadine and she was taken out of the dorsal lithotomy position.  Upon awakening the LMA was removed and the patient was transferred to the recovery room in stable and awake condition.  The sponge and instrument count were correct. There were no complications.

## 2019-07-25 NOTE — Progress Notes (Signed)
Pt recovered from anesthesia waiting on ride at this time.

## 2019-07-25 NOTE — Anesthesia Procedure Notes (Signed)
Procedure Name: LMA Insertion Date/Time: 07/25/2019 7:26 AM Performed by: Alain Marion, CRNA Pre-anesthesia Checklist: Patient identified, Emergency Drugs available, Suction available and Patient being monitored Patient Re-evaluated:Patient Re-evaluated prior to induction Oxygen Delivery Method: Circle System Utilized Preoxygenation: Pre-oxygenation with 100% oxygen Induction Type: IV induction Ventilation: Mask ventilation without difficulty LMA: LMA inserted LMA Size: 4.0 Number of attempts: 1 Airway Equipment and Method: Bite block Placement Confirmation: positive ETCO2 Tube secured with: Tape Dental Injury: Teeth and Oropharynx as per pre-operative assessment

## 2019-07-25 NOTE — Transfer of Care (Signed)
Immediate Anesthesia Transfer of Care Note  Patient: Tracy Sampson  Procedure(s) Performed: HYSTEROSCOPY OF CERVIX WITH ULTRASOUND GUIDANCE/KS (N/A Vagina ) OPERATIVE ULTRASOUND (N/A Vagina )  Patient Location: PACU  Anesthesia Type:General  Level of Consciousness: awake, alert  and oriented  Airway & Oxygen Therapy: Patient Spontanous Breathing and Patient connected to face mask oxygen  Post-op Assessment: Report given to RN and Post -op Vital signs reviewed and stable  Post vital signs: Reviewed and stable  Last Vitals:  Vitals Value Taken Time  BP  149/81    Temp    Pulse 61   Resp 18 07/25/19 0812  SpO2 97   Vitals shown include unvalidated device data.  Last Pain:  Vitals:   07/25/19 0607  PainSc: 0-No pain      Patients Stated Pain Goal: 3 ([id number])  Complications: No apparent anesthesia complications

## 2019-07-25 NOTE — Interval H&P Note (Signed)
History and Physical Interval Note:  07/25/2019 7:10 AM  Tracy Sampson  has presented today for surgery, with the diagnosis of Endocervical polyp.  The various methods of treatment have been discussed with the patient and family. After consideration of risks, benefits and other options for treatment, the patient has consented to  Procedure(s): HYSTEROSCOPY OF CERVIX WITH ULTRASOUND GUIDANCE/KS (N/A) OPERATIVE ULTRASOUND (N/A) as a surgical intervention.  The patient's history has been reviewed, patient examined, no change in status, stable for surgery.  I have reviewed the patient's chart and labs.  Questions were answered to the patient's satisfaction.     Salvadore Dom

## 2019-07-25 NOTE — Anesthesia Postprocedure Evaluation (Signed)
Anesthesia Post Note  Patient: Tracy Sampson  Procedure(s) Performed: HYSTEROSCOPY OF CERVIX WITH ULTRASOUND GUIDANCE/KS (N/A Vagina ) OPERATIVE ULTRASOUND (N/A Vagina )     Patient location during evaluation: PACU Anesthesia Type: General Level of consciousness: awake and alert Pain management: pain level controlled Vital Signs Assessment: post-procedure vital signs reviewed and stable Respiratory status: spontaneous breathing, nonlabored ventilation, respiratory function stable and patient connected to nasal cannula oxygen Cardiovascular status: blood pressure returned to baseline and stable Postop Assessment: no apparent nausea or vomiting Anesthetic complications: no    Last Vitals:  Vitals:   07/25/19 0912 07/25/19 0914  BP:  (!) 151/80  Pulse: 62 62  Resp: 16 19  Temp:    SpO2: 100% 100%    Last Pain:  Vitals:   07/25/19 0914  PainSc: [place] A Vitoria Conyer

## 2019-07-26 LAB — SURGICAL PATHOLOGY

## 2019-08-04 ENCOUNTER — Other Ambulatory Visit: Payer: Self-pay | Admitting: Internal Medicine

## 2019-08-10 ENCOUNTER — Other Ambulatory Visit: Payer: Self-pay

## 2019-08-10 ENCOUNTER — Ambulatory Visit (INDEPENDENT_AMBULATORY_CARE_PROVIDER_SITE_OTHER): Payer: Managed Care, Other (non HMO) | Admitting: Obstetrics and Gynecology

## 2019-08-10 ENCOUNTER — Encounter: Payer: Self-pay | Admitting: Obstetrics and Gynecology

## 2019-08-10 VITALS — BP 140/92 | HR 86 | Temp 98.1°F | Ht 66.0 in | Wt 332.0 lb

## 2019-08-10 DIAGNOSIS — N898 Other specified noninflammatory disorders of vagina: Secondary | ICD-10-CM

## 2019-08-10 DIAGNOSIS — Z9889 Other specified postprocedural states: Secondary | ICD-10-CM | POA: Diagnosis not present

## 2019-08-10 NOTE — Patient Instructions (Signed)
Try replens vaginal moisturizer.  

## 2019-08-10 NOTE — Progress Notes (Signed)
GYNECOLOGY  VISIT   HPI: 64 y.o.   Single Black or African American Not Hispanic or Latino  female   [id number] with No LMP recorded (lmp unknown). Patient has had a hysterectomy.   here for  Two week post op s/p hysteroscopy of her cervix with polypectomy (h/o supracervical hysterectomy). Pathology was benign.  Pateint states that she has been feeling good. She reports no bleeding.  She has had a vaginal odor for a long time, negative vaginitis testing in the fall. She thinks it may be slightly better than it was, definitely not worse. She isn't sexually active, no increase in discharge. She is asking if her weight could contribute to the odor.   GYNECOLOGIC HISTORY: No LMP recorded (lmp unknown). Patient has had a hysterectomy. Contraception:NA Menopausal hormone therapy: none         OB History    Gravida  0   Para  0   Term  0   Preterm  0   AB  0   Living  0     SAB  0   TAB  0   Ectopic  0   Multiple  0   Live Births                 Patient Active Problem List   Diagnosis Date Noted  . Postmenopausal bleeding 07/11/2019  . Shoulder pain, right 11/15/2017  . OAB (overactive bladder) 11/15/2017  . Contact dermatitis 07/07/2017  . Head congestion 07/07/2017  . Subconjunctival hemorrhage of right eye 12/04/2016  . Incarcerated ventral hernia 01/07/2016  . Left ear pain 06/06/2015  . Rectal bleeding 02/06/2015  . Blood in stool 12/04/2014  . Body odor 12/04/2014  . Umbilical hernia [id number]  . Incontinence in female 08/16/2013  . Trigger finger, acquired 08/16/2013  . Well adult exam 01/11/2012  . Nocturia 02/27/2011  . Paresthesia 10/23/2010  . Ventral hernia 03/01/2009  . Vitamin D deficiency 08/28/2008  . KNEE PAIN 08/28/2008  . OBESITY, MORBID 09/23/2007  . RASH AND OTHER NONSPECIFIC SKIN ERUPTION 09/07/2007  . Hypothyroidism 05/10/2007  . Depression 05/03/2007  . Essential hypertension 05/03/2007  . Allergic rhinitis 05/03/2007  . G E R D  05/03/2007  . OSA on CPAP 05/03/2007  . SUBMUCOUS LEIOMYOMA OF UTERUS 05/02/2007  . GOITER, MULTINODULAR 05/02/2007  . DYSPHAGIA UNSPECIFIED 05/02/2007    Past Medical History:  Diagnosis Date  . Allergic rhinitis   . Allergy   . Arthritis   . Cataract    bilateral removed  . Depression   . GERD (gastroesophageal reflux disease)   . Glaucoma   . Hypertension   . Hypothyroidism   . Morbid obesity ([place])   . OSA (obstructive sleep apnea)    Dr Joya Gaskins  . Sleep apnea    wears cpap  . Thyroid disease   . Urinary incontinence     Past Surgical History:  Procedure Laterality Date  . abdominal supracervical hysterectomy with bilateral salpingo-oophorectomy     fibroid uterus  . ANKLE SURGERY  4/10   rt  . BREAST REDUCTION SURGERY    . CATARACT EXTRACTION    . COLONOSCOPY    . HERNIA REPAIR  0000000   umbillical  . HYSTEROSCOPY N/A 07/25/2019   Procedure: HYSTEROSCOPY OF CERVIX WITH ULTRASOUND GUIDANCE/KS;  Surgeon: Salvadore Dom, MD;  Location: [facility];  Service: Gynecology;  Laterality: N/A;  . INSERTION OF MESH N/A 01/07/2016   Procedure: INSERTION OF MESH;  Surgeon: Coralie Keens, MD;  Location: [facility];  Service: General;  Laterality: N/A;  . OPERATIVE ULTRASOUND N/A 07/25/2019   Procedure: OPERATIVE ULTRASOUND;  Surgeon: Salvadore Dom, MD;  Location: [facility];  Service: Gynecology;  Laterality: N/A;  . REDUCTION MAMMAPLASTY    . VENTRAL HERNIA REPAIR N/A 01/07/2016   Procedure: VENTRAL HERNIA REPAIR;  Surgeon: Coralie Keens, MD;  Location: [facility];  Service: General;  Laterality: N/A;    Current Outpatient Medications  Medication Sig Dispense Refill  . amLODipine (NORVASC) 5 MG tablet TAKE 1 TABLET(5 MG) BY MOUTH DAILY 90 tablet 1  . cholecalciferol (VITAMIN D3) 25 MCG (1000 UNIT) tablet Take 1,000 Units by mouth daily.    . dorzolamide-timolol (COSOPT) 22.3-6.8 MG/ML ophthalmic solution Place 1 drop into both eyes 2 (two) times daily.     [name] ibuprofen  (ADVIL,MOTRIN) 600 MG tablet Take 1 tablet (600 mg total) by mouth every 8 (eight) hours as needed. (Patient not taking: Reported on 07/18/2019) 30 tablet 1  . levothyroxine (SYNTHROID) 150 MCG tablet TAKE 1 TABLET(150 MCG) BY MOUTH DAILY BEFORE BREAKFAST (Patient taking differently: Take 150 mcg by mouth daily before breakfast. ) 90 tablet 1  . loratadine (CLARITIN) 10 MG tablet Take 1 tablet (10 mg total) by mouth daily. (Patient not taking: Reported on 07/18/2019) 30 tablet 11  . losartan (COZAAR) 100 MG tablet TAKE 1 TABLET(100 MG) BY MOUTH DAILY 30 tablet 11  . pantoprazole (PROTONIX) 40 MG tablet Take 1 tablet (40 mg total) by mouth daily. (Patient not taking: Reported on 07/18/2019) 30 tablet 11  . sertraline (ZOLOFT) 100 MG tablet TAKE 1 TABLET(100 MG) BY MOUTH DAILY (Patient taking differently: Take 100 mg by mouth daily. ) 90 tablet 3  . vitamin B-12 (CYANOCOBALAMIN) 500 MCG tablet Take 500 mcg by mouth daily.     No current facility-administered medications for this visit.     ALLERGIES: Tribenzor [olmesartan-amlodipine-hctz]  Family History  Problem Relation Age of Onset  . Breast cancer Mother   . Cancer Mother        breast  . Hypertension Mother   . Colon cancer Maternal Grandmother   . Cancer Maternal Grandmother        colon    Social History   Socioeconomic History  . Marital status: Single    Spouse name: Not on file  . Number of children: Not on file  . Years of education: Not on file  . Highest education level: Not on file  Occupational History  . Occupation: [employer]    Comment: used to work in Therapist, art  Tobacco Use  . Smoking status: Never Smoker  . Smokeless tobacco: Never Used  Substance and Sexual Activity  . Alcohol use: No    Alcohol/week: 0.0 standard drinks  . Drug use: No  . Sexual activity: Not Currently    Partners: Male    Birth control/protection: Surgical  Other Topics Concern  . Not on file  Social History Narrative  . Not on file    Social Determinants of Health   Financial Resource Strain:   . Difficulty of Paying Living Expenses:   Food Insecurity:   . Worried About Charity fundraiser in the Last Year:   . Arboriculturist in the Last Year:   Transportation Needs:   . Film/video editor (Medical):   [name] Lack of Transportation (Non-Medical):   Physical Activity:   . Days of Exercise per Week:   . Minutes of Exercise per Session:  Stress:   . Feeling of Stress :   Social Connections:   . Frequency of Communication with Friends and Family:   . Frequency of Social Gatherings with Friends and Family:   . Attends Religious Services:   . Active Member of Clubs or Organizations:   . Attends Archivist Meetings:   [name] Marital Status:   Intimate Partner Violence:   . Fear of Current or Ex-Partner:   . Emotionally Abused:   [name] Physically Abused:   . Sexually Abused:     Review of Systems  All other systems reviewed and are negative.   PHYSICAL EXAMINATION:    LMP  (LMP Unknown)     General appearance: alert, cooperative and appears stated age Abdomen: soft, obese, non-tender; non distended, no masses,  no organomegaly   ASSESSMENT S/P cervical hysteroscopy and polypectomy, benign pathology. Doing well Vaginal odor, long term, mild, prior negative vaginitis testing.     PLAN Recommended she try Replens vaginal moisturizer or coconut oil to see if that helps with the odor. Routine f/u.    An After Visit Summary was printed and given to the patient.

## 2019-08-14 ENCOUNTER — Other Ambulatory Visit: Payer: Self-pay

## 2019-08-14 ENCOUNTER — Encounter: Payer: Self-pay | Admitting: Internal Medicine

## 2019-08-14 ENCOUNTER — Ambulatory Visit: Payer: Managed Care, Other (non HMO) | Admitting: Internal Medicine

## 2019-08-14 DIAGNOSIS — E034 Atrophy of thyroid (acquired): Secondary | ICD-10-CM

## 2019-08-14 DIAGNOSIS — K219 Gastro-esophageal reflux disease without esophagitis: Secondary | ICD-10-CM

## 2019-08-14 DIAGNOSIS — E559 Vitamin D deficiency, unspecified: Secondary | ICD-10-CM | POA: Diagnosis not present

## 2019-08-14 DIAGNOSIS — I10 Essential (primary) hypertension: Secondary | ICD-10-CM | POA: Diagnosis not present

## 2019-08-14 MED ORDER — LOSARTAN POTASSIUM 100 MG PO TABS: ORAL_TABLET | ORAL | 3 refills | Status: DC

## 2019-08-14 MED ORDER — PANTOPRAZOLE SODIUM 40 MG PO TBEC: 40.0000 mg | DELAYED_RELEASE_TABLET | Freq: Every day | ORAL | 3 refills | Status: DC

## 2019-08-14 NOTE — Assessment & Plan Note (Signed)
Vit D 

## 2019-08-14 NOTE — Assessment & Plan Note (Signed)
Levoxyl 

## 2019-08-14 NOTE — Assessment & Plan Note (Signed)
On Amlodipine, Losartan °

## 2019-08-14 NOTE — Patient Instructions (Signed)
FEMA mass vaccine site in [place]:  Call the COVID-19 Vaccine Help Center at [phone] to schedule your shot at [place].   [place] federal vaccine clinic is preparing to open on Wednesday 07/26/19. The FEMA site at [place] has the capacity to vaccinate 3,000 people a day for eight weeks. That's nearly 170,000 doses just from this one clinic.   With such a big operation, here are answers to some questions you may have about the drive-thru and indoor site:  How do I make an appointment?   Head to [email or web address] to schedule your appointment indoors or in the drive-thru, or call the COVID-19 Vaccine Help Center at [phone].  What if I need to change or cancel my appointment, or have more questions?   Call the COVID-19 Vaccine Help Center at [phone].  What vaccines will be available at the clinic?   The vaccine clinic will begin giving both Pfizer and Moderna two-dose COVID-19 vaccines. The single-dose [employer] vaccines will be given during the last two weeks of the clinic.   What part of the [place] [place] [facility] do I enter for my appointment?  Enter from [address] and turn onto [address]. A clinic staff member will confirm your appointment for the day. You'll then either be directed to registration or to a waiting area until your appointment time.  Can I be seen sooner if I'm early?  Those who are early will park in a designated waiting area until their vaccine appointment time approaches.  How does registration work?  There are five open lanes for registration. Someone will get the necessary information needed to confirm appointments and other details to keep a record of who's getting the vaccine every day. Temperatures will be checked to make sure you're in good shape to get the vaccine.  How long will it take to get my shot?  You'll park your car in a long tent with about 10  other vehicles. All 10 people in that group will get a shot inside their vehicles. Getting the actual shot only takes a few minutes. The entire process takes about 30 minutes.  How does the observation period work?  All patients will wait in the same tent they got their vaccine at for 15 minutes.       

## 2019-08-14 NOTE — Progress Notes (Signed)
Subjective:  Patient ID: Tracy Sampson, female    DOB: February 13, 1956  Age: 64 y.o. MRN: [id number]  CC: No chief complaint on file.   HPI Tracy Sampson presents for cervical polyp - removed, anxiety, HTN, GERD f/u  Outpatient Medications Prior to Visit  Medication Sig Dispense Refill  . amLODipine (NORVASC) 5 MG tablet TAKE 1 TABLET(5 MG) BY MOUTH DAILY 90 tablet 1  . cholecalciferol (VITAMIN D3) 25 MCG (1000 UNIT) tablet Take 1,000 Units by mouth daily.    . dorzolamide-timolol (COSOPT) 22.3-6.8 MG/ML ophthalmic solution Place 1 drop into both eyes 2 (two) times daily.     [name] ibuprofen (ADVIL,MOTRIN) 600 MG tablet Take 1 tablet (600 mg total) by mouth every 8 (eight) hours as needed. 30 tablet 1  . levothyroxine (SYNTHROID) 150 MCG tablet TAKE 1 TABLET(150 MCG) BY MOUTH DAILY BEFORE BREAKFAST (Patient taking differently: Take 150 mcg by mouth daily before breakfast. ) 90 tablet 1  . loratadine (CLARITIN) 10 MG tablet Take 1 tablet (10 mg total) by mouth daily. 30 tablet 11  . losartan (COZAAR) 100 MG tablet TAKE 1 TABLET(100 MG) BY MOUTH DAILY 30 tablet 11  . pantoprazole (PROTONIX) 40 MG tablet Take 1 tablet (40 mg total) by mouth daily. 30 tablet 11  . sertraline (ZOLOFT) 100 MG tablet TAKE 1 TABLET(100 MG) BY MOUTH DAILY (Patient taking differently: Take 100 mg by mouth daily. ) 90 tablet 3  . vitamin B-12 (CYANOCOBALAMIN) 500 MCG tablet Take 500 mcg by mouth daily.     No facility-administered medications prior to visit.    ROS: Review of Systems  Constitutional: Negative for activity change, appetite change, chills, fatigue and unexpected weight change.  HENT: Negative for congestion, mouth sores and sinus pressure.   Eyes: Negative for visual disturbance.  Respiratory: Negative for cough and chest tightness.   Gastrointestinal: Negative for abdominal pain and nausea.  Genitourinary: Negative for difficulty urinating, frequency and vaginal pain.  Musculoskeletal: Negative  for back pain and gait problem.  Skin: Negative for pallor and rash.  Neurological: Negative for dizziness, tremors, weakness, numbness and headaches.  Psychiatric/Behavioral: Negative for confusion and sleep disturbance.    Objective:  BP 138/86 (BP Location: Right Arm, Patient Position: Sitting, Cuff Size: Large)   Pulse 100   Temp 98 F (36.7 C) (Oral)   Ht 5\' 6"  (1.676 m)   Wt (!) 329 lb (149.2 kg)   LMP  (LMP Unknown)   SpO2 95%   BMI 53.10 kg/m   BP Readings from Last 3 Encounters:  08/14/19 138/86  08/10/19 (!) 140/92  07/25/19 (!) 151/80    Wt Readings from Last 3 Encounters:  08/14/19 (!) 329 lb (149.2 kg)  08/10/19 (!) 332 lb (150.6 kg)  07/25/19 (!) 333 lb (151 kg)    Physical Exam Constitutional:      General: She is not in acute distress.    Appearance: She is well-developed. She is obese.  HENT:     Head: Normocephalic.     Right Ear: External ear normal.     Left Ear: External ear normal.     Nose: Nose normal.  Eyes:     General:        Right eye: No discharge.        Left eye: No discharge.     Conjunctiva/sclera: Conjunctivae normal.     Pupils: Pupils are equal, round, and reactive to light.  Neck:     Thyroid: No thyromegaly.  Vascular: No JVD.     Trachea: No tracheal deviation.  Cardiovascular:     Rate and Rhythm: Normal rate and regular rhythm.     Heart sounds: Normal heart sounds.  Pulmonary:     Effort: No respiratory distress.     Breath sounds: No stridor. No wheezing.  Abdominal:     General: Bowel sounds are normal. There is no distension.     Palpations: Abdomen is soft. There is no mass.     Tenderness: There is no abdominal tenderness. There is no guarding or rebound.  Musculoskeletal:        General: No tenderness.     Cervical back: Normal range of motion and neck supple.  Lymphadenopathy:     Cervical: No cervical adenopathy.  Skin:    Findings: No erythema or rash.  Neurological:     Cranial Nerves: No cranial  nerve deficit.     Motor: No abnormal muscle tone.     Coordination: Coordination normal.     Deep Tendon Reflexes: Reflexes normal.  Psychiatric:        Behavior: Behavior normal.        Thought Content: Thought content normal.        Judgment: Judgment normal.     Lab Results  Component Value Date   WBC 7.3 07/21/2019   HGB 12.9 07/21/2019   HCT 42.3 07/21/2019   PLT 254 07/21/2019   GLUCOSE 104 (H) 07/21/2019   CHOL 191 10/27/2018   TRIG 109.0 10/27/2018   HDL 40.70 10/27/2018   LDLDIRECT 149.3 10/23/2010   LDLCALC 128 (H) 10/27/2018   ALT 21 10/27/2018   AST 19 10/27/2018   NA 144 07/21/2019   K 3.7 07/21/2019   CL 108 07/21/2019   CREATININE 0.81 07/21/2019   BUN 9 07/21/2019   CO2 24 07/21/2019   TSH 2.32 05/08/2019   HGBA1C 5.7 05/09/2018    [place] Intraoperative  Result Date: 07/25/2019 CLINICAL DATA:  Ultrasound was provided for use by the ordering physician, and a technical charge was applied by the performing facility.  No radiologist interpretation/professional services rendered.    Assessment & Plan:     Follow-up: No follow-ups on file.  Walker Kehr, MD

## 2019-08-14 NOTE — Assessment & Plan Note (Signed)
Protonix.  ?

## 2019-08-14 NOTE — Assessment & Plan Note (Signed)
Wt Readings from Last 3 Encounters:  08/14/19 (!) 329 lb (149.2 kg)  08/10/19 (!) 332 lb (150.6 kg)  07/25/19 (!) 333 lb (151 kg)

## 2019-08-21 ENCOUNTER — Telehealth: Payer: Self-pay | Admitting: Obstetrics and Gynecology

## 2019-08-21 NOTE — Telephone Encounter (Signed)
Patient noticed blood in stool.

## 2019-08-21 NOTE — Telephone Encounter (Signed)
Left message for pt return call to triage RN.

## 2019-08-21 NOTE — Telephone Encounter (Signed)
Pt returned call. Pt reports having rectal bleeding x 1 on Thursday evening 08/17/2019 with BM. Reports having bright red bleeding on toilet paper and in toilet. Denies vaginal bleeding. Denies any bleeding since 08/17/2019.  Pt had hysteroscopy on 07/25/2019 with Dr Talbert Nan. Pt denies hx of hemorrhoids, change in foods, medications or exercise routine. Pt declines OV at this time. Would like to call PCP to schedule appt. Advised to return call to office if unable to obtain appt, will need OV for further evaluation.   Routing to Dr Talbert Nan.  Encounter closed.

## 2019-08-25 ENCOUNTER — Telehealth: Payer: Self-pay | Admitting: Internal Medicine

## 2019-08-25 NOTE — Telephone Encounter (Signed)
LMTCB

## 2019-08-25 NOTE — Telephone Encounter (Signed)
    Patient calling to discuss most recent medications added. Patient states she doesn't know why they were prescribed.

## 2019-09-05 ENCOUNTER — Ambulatory Visit: Payer: Managed Care, Other (non HMO) | Admitting: Internal Medicine

## 2019-09-05 ENCOUNTER — Other Ambulatory Visit: Payer: Self-pay

## 2019-09-05 ENCOUNTER — Encounter: Payer: Self-pay | Admitting: Internal Medicine

## 2019-09-05 DIAGNOSIS — N950 Postmenopausal bleeding: Secondary | ICD-10-CM

## 2019-09-05 DIAGNOSIS — I10 Essential (primary) hypertension: Secondary | ICD-10-CM | POA: Diagnosis not present

## 2019-09-05 DIAGNOSIS — K219 Gastro-esophageal reflux disease without esophagitis: Secondary | ICD-10-CM

## 2019-09-05 DIAGNOSIS — K625 Hemorrhage of anus and rectum: Secondary | ICD-10-CM

## 2019-09-05 DIAGNOSIS — K921 Melena: Secondary | ICD-10-CM

## 2019-09-05 LAB — CBC WITH DIFFERENTIAL/PLATELET
Basophils Absolute: 0 10*3/uL (ref 0.0–0.1)
Basophils Relative: 0.4 % (ref 0.0–3.0)
Eosinophils Absolute: 0.3 10*3/uL (ref 0.0–0.7)
Eosinophils Relative: 3.5 % (ref 0.0–5.0)
HCT: 40.6 % (ref 36.0–46.0)
Hemoglobin: 12.9 g/dL (ref 12.0–15.0)
Lymphocytes Relative: 29.4 % (ref 12.0–46.0)
Lymphs Abs: 2.2 10*3/uL (ref 0.7–4.0)
MCHC: 31.9 g/dL (ref 30.0–36.0)
MCV: 94.5 fl (ref 78.0–100.0)
Monocytes Absolute: 0.5 10*3/uL (ref 0.1–1.0)
Monocytes Relative: 6.3 % (ref 3.0–12.0)
Neutro Abs: 4.6 10*3/uL (ref 1.4–7.7)
Neutrophils Relative %: 60.4 % (ref 43.0–77.0)
Platelets: 265 10*3/uL (ref 150.0–400.0)
RBC: 4.3 Mil/uL (ref 3.87–5.11)
RDW: 13.7 % (ref 11.5–15.5)
WBC: 7.6 10*3/uL (ref 4.0–10.5)

## 2019-09-05 LAB — BASIC METABOLIC PANEL
BUN: 12 mg/dL (ref 6–23)
CO2: 28 mEq/L (ref 19–32)
Calcium: 9.9 mg/dL (ref 8.4–10.5)
Chloride: 106 mEq/L (ref 96–112)
Creatinine, Ser: 0.79 mg/dL (ref 0.40–1.20)
GFR: 88.47 mL/min (ref >60.00)
Glucose, Bld: 147 mg/dL — ABNORMAL HIGH (ref 70–99)
Potassium: 4.2 mEq/L (ref 3.5–5.1)
Sodium: 143 mEq/L (ref 135–145)

## 2019-09-05 LAB — PROTIME-INR
INR: 1.1 ratio — ABNORMAL HIGH (ref 0.8–1.0)
Prothrombin Time: 11.9 s (ref 9.6–13.1)

## 2019-09-05 MED ORDER — PANTOPRAZOLE SODIUM 40 MG PO TBEC: 40.0000 mg | DELAYED_RELEASE_TABLET | Freq: Every day | ORAL | 3 refills | Status: DC

## 2019-09-05 NOTE — Addendum Note (Signed)
Addended by: Cresenciano Lick on: 09/05/2019 08:26 AM   Modules accepted: Orders

## 2019-09-05 NOTE — Assessment & Plan Note (Signed)
CBC, INR, BMET

## 2019-09-05 NOTE — Assessment & Plan Note (Signed)
Get covid vaccine

## 2019-09-05 NOTE — Progress Notes (Signed)
Subjective:  Patient ID: Tracy Sampson, female    DOB: 29-Mar-1956  Age: 64 y.o. MRN: [id number]  CC: No chief complaint on file.   HPI Tracy Sampson presents for a rectal bleeding episode 2 weeks ago after a BM - it was a lot of blood in the toilet bowl. No hemorrhoids... Last colon 2017  Outpatient Medications Prior to Visit  Medication Sig Dispense Refill  . amLODipine (NORVASC) 5 MG tablet TAKE 1 TABLET(5 MG) BY MOUTH DAILY 90 tablet 1  . cholecalciferol (VITAMIN D3) 25 MCG (1000 UNIT) tablet Take 1,000 Units by mouth daily.    . dorzolamide-timolol (COSOPT) 22.3-6.8 MG/ML ophthalmic solution Place 1 drop into both eyes 2 (two) times daily.     [name] ibuprofen (ADVIL,MOTRIN) 600 MG tablet Take 1 tablet (600 mg total) by mouth every 8 (eight) hours as needed. 30 tablet 1  . levothyroxine (SYNTHROID) 150 MCG tablet TAKE 1 TABLET(150 MCG) BY MOUTH DAILY BEFORE BREAKFAST (Patient taking differently: Take 150 mcg by mouth daily before breakfast. ) 90 tablet 1  . loratadine (CLARITIN) 10 MG tablet Take 1 tablet (10 mg total) by mouth daily. 30 tablet 11  . losartan (COZAAR) 100 MG tablet TAKE 1 TABLET(100 MG) BY MOUTH DAILY 90 tablet 3  . pantoprazole (PROTONIX) 40 MG tablet Take 1 tablet (40 mg total) by mouth daily. 90 tablet 3  . sertraline (ZOLOFT) 100 MG tablet TAKE 1 TABLET(100 MG) BY MOUTH DAILY (Patient taking differently: Take 100 mg by mouth daily. ) 90 tablet 3  . vitamin B-12 (CYANOCOBALAMIN) 500 MCG tablet Take 500 mcg by mouth daily.     No facility-administered medications prior to visit.    ROS: Review of Systems  Constitutional: Negative for activity change, appetite change, chills, fatigue and unexpected weight change.  HENT: Negative for congestion, mouth sores and sinus pressure.   Eyes: Negative for visual disturbance.  Respiratory: Negative for cough and chest tightness.   Gastrointestinal: Positive for anal bleeding. Negative for abdominal pain, constipation,  nausea, rectal pain and vomiting.  Genitourinary: Negative for difficulty urinating, frequency and vaginal pain.  Musculoskeletal: Negative for back pain and gait problem.  Skin: Negative for pallor and rash.  Neurological: Negative for dizziness, tremors, weakness, numbness and headaches.  Psychiatric/Behavioral: Negative for confusion and sleep disturbance.    Objective:  BP 138/86 (BP Location: Left Arm, Patient Position: Sitting, Cuff Size: Large)   Pulse 73   Temp 98 F (36.7 C) (Oral)   Ht 5\' 6"  (1.676 m)   Wt (!) 330 lb (149.7 kg)   LMP  (LMP Unknown)   SpO2 96%   BMI 53.26 kg/m   BP Readings from Last 3 Encounters:  09/05/19 138/86  08/14/19 138/86  08/10/19 (!) 140/92    Wt Readings from Last 3 Encounters:  09/05/19 (!) 330 lb (149.7 kg)  08/14/19 (!) 329 lb (149.2 kg)  08/10/19 (!) 332 lb (150.6 kg)    Physical Exam Constitutional:      General: She is not in acute distress.    Appearance: She is well-developed. She is obese.  HENT:     Head: Normocephalic.     Right Ear: External ear normal.     Left Ear: External ear normal.     Nose: Nose normal.  Eyes:     General:        Right eye: No discharge.        Left eye: No discharge.     Conjunctiva/sclera: Conjunctivae  normal.     Pupils: Pupils are equal, round, and reactive to light.  Neck:     Thyroid: No thyromegaly.     Vascular: No JVD.     Trachea: No tracheal deviation.  Cardiovascular:     Rate and Rhythm: Normal rate and regular rhythm.     Heart sounds: Normal heart sounds.  Pulmonary:     Effort: No respiratory distress.     Breath sounds: No stridor. No wheezing.  Abdominal:     General: Bowel sounds are normal. There is no distension.     Palpations: Abdomen is soft. There is no mass.     Tenderness: There is no abdominal tenderness. There is no guarding or rebound.  Musculoskeletal:        General: No tenderness.     Cervical back: Normal range of motion and neck supple.    Lymphadenopathy:     Cervical: No cervical adenopathy.  Skin:    Findings: No erythema or rash.  Neurological:     Cranial Nerves: No cranial nerve deficit.     Motor: No abnormal muscle tone.     Coordination: Coordination normal.     Deep Tendon Reflexes: Reflexes normal.  Psychiatric:        Behavior: Behavior normal.        Thought Content: Thought content normal.        Judgment: Judgment normal.    Rectal - per GI   Lab Results  Component Value Date   WBC 7.3 07/21/2019   HGB 12.9 07/21/2019   HCT 42.3 07/21/2019   PLT 254 07/21/2019   GLUCOSE 104 (H) 07/21/2019   CHOL 191 10/27/2018   TRIG 109.0 10/27/2018   HDL 40.70 10/27/2018   LDLDIRECT 149.3 10/23/2010   LDLCALC 128 (H) 10/27/2018   ALT 21 10/27/2018   AST 19 10/27/2018   NA 144 07/21/2019   K 3.7 07/21/2019   CL 108 07/21/2019   CREATININE 0.81 07/21/2019   BUN 9 07/21/2019   CO2 24 07/21/2019   TSH 2.32 05/08/2019   HGBA1C 5.7 05/09/2018    [place] Intraoperative  Result Date: 07/25/2019 CLINICAL DATA:  Ultrasound was provided for use by the ordering physician, and a technical charge was applied by the performing facility.  No radiologist interpretation/professional services rendered.    Assessment & Plan:    Walker Kehr, MD

## 2019-09-05 NOTE — Assessment & Plan Note (Signed)
Start protonix 

## 2019-09-05 NOTE — Assessment & Plan Note (Signed)
?  etiology No relapse since 2 wks ago CBC, INR, BMET Will ref to Dr Silverio Decamp - last colon 2017

## 2019-09-05 NOTE — Assessment & Plan Note (Signed)
Resolved

## 2019-09-20 ENCOUNTER — Encounter: Payer: Self-pay | Admitting: Gastroenterology

## 2019-10-26 ENCOUNTER — Ambulatory Visit (INDEPENDENT_AMBULATORY_CARE_PROVIDER_SITE_OTHER): Payer: Managed Care, Other (non HMO) | Admitting: Gastroenterology

## 2019-10-26 ENCOUNTER — Encounter: Payer: Self-pay | Admitting: Gastroenterology

## 2019-10-26 VITALS — BP 150/84 | HR 69 | Ht 66.0 in | Wt 329.0 lb

## 2019-10-26 DIAGNOSIS — K625 Hemorrhage of anus and rectum: Secondary | ICD-10-CM

## 2019-10-26 MED ORDER — HYDROCORTISONE ACETATE 25 MG RE SUPP
25.0000 mg | Freq: Every evening | RECTAL | 0 refills | Status: DC | PRN
Start: 2019-10-26 — End: 2019-12-07

## 2019-10-26 MED ORDER — SUPREP BOWEL PREP KIT 17.5-3.13-1.6 GM/177ML PO SOLN
1.0000 | ORAL | 0 refills | Status: DC
Start: 2019-10-26 — End: 2019-12-19

## 2019-10-26 NOTE — Patient Instructions (Addendum)
If you are age 65 or older, your body mass index should be between 23-30. Your Body mass index is 53.1 kg/m. If this is out of the aforementioned range listed, please consider follow up with your Primary Care Provider.  If you are age 18 or younger, your body mass index should be between 19-25. Your Body mass index is 53.1 kg/m. If this is out of the aformentioned range listed, please consider follow up with your Primary Care Provider.   You have been scheduled for a colonoscopy. Please follow written instructions given to you at your visit today.  Please pick up your prep supplies at the pharmacy within the next 1-3 days. If you use inhalers (even only as needed), please bring them with you on the day of your procedure.  Due to recent changes in healthcare laws, you may see the results of your imaging and laboratory studies on MyChart before your provider has had a chance to review them.  We understand that in some cases there may be results that are confusing or concerning to you. Not all laboratory results come back in the same time frame and the provider may be waiting for multiple results in order to interpret others.  Please give [place] 48 hours in order for your provider to thoroughly review all the results before contacting the office for clarification of your results.   Please purchase the following medications over the counter and take as directed:  START: Benefiber take 1 tablespoon twice daily with meals.  We have sent the following medications to your pharmacy for you to pick up at your convenience:  START: Anusol suppository at bedtime as needed.  I appreciate the opportunity to care for you. Thank you for choosing me and [facility],  Dr. Harl Bowie

## 2019-10-26 NOTE — Progress Notes (Signed)
Tracy Sampson    [id number]    1956-04-11  Primary Care Physician:Plotnikov, Evie Lacks, MD  Referring Physician: Cassandria Anger, MD [place],  [place] [id number]   Chief complaint: Rectal bleeding HPI:  64 year old female with history of hypothyroidism and hypertension here with complaints of rectal bleeding.  Last seen in February 2017.  Patient here for evaluation of intermittent bright red blood per rectum.  Denies constipation or excessive straining with bowel movements.  She in the toilet bowl and also when she wipes, worse in the past few weeks to months.  Colonoscopy July 12, 2015: Diverticulosis, flat polyp removed from cecum, benign leiomyoma with no adenomatous tissue.  Reviewed recent labs from September 05, 2019: Normal CBC, INR 1.1, normal BUN and creatinine.  No family history of GI malignancy. Outpatient Encounter Medications as of 10/26/2019  Medication Sig  . amLODipine (NORVASC) 5 MG tablet TAKE 1 TABLET(5 MG) BY MOUTH DAILY  . cholecalciferol (VITAMIN D3) 25 MCG (1000 UNIT) tablet Take 1,000 Units by mouth daily.  . dorzolamide-timolol (COSOPT) 22.3-6.8 MG/ML ophthalmic solution Place 1 drop into both eyes 2 (two) times daily.   [name] ibuprofen (ADVIL,MOTRIN) 600 MG tablet Take 1 tablet (600 mg total) by mouth every 8 (eight) hours as needed.  [name] levothyroxine (SYNTHROID) 150 MCG tablet TAKE 1 TABLET(150 MCG) BY MOUTH DAILY BEFORE BREAKFAST (Patient taking differently: Take 150 mcg by mouth daily before breakfast. )  . loratadine (CLARITIN) 10 MG tablet Take 1 tablet (10 mg total) by mouth daily.  [name] losartan (COZAAR) 100 MG tablet TAKE 1 TABLET(100 MG) BY MOUTH DAILY  . pantoprazole (PROTONIX) 40 MG tablet Take 1 tablet (40 mg total) by mouth daily.  . sertraline (ZOLOFT) 100 MG tablet TAKE 1 TABLET(100 MG) BY MOUTH DAILY (Patient taking differently: Take 100 mg by mouth daily. )  . vitamin B-12 (CYANOCOBALAMIN) 500 MCG tablet  Take 500 mcg by mouth daily.   No facility-administered encounter medications on file as of 10/26/2019.    Allergies as of 10/26/2019 - Review Complete 10/26/2019  Allergen Reaction Noted  . Tribenzor [olmesartan-amlodipine-hctz]  06/12/2011    Past Medical History:  Diagnosis Date  . Allergic rhinitis   . Allergy   . Arthritis   . Cataract    bilateral removed  . Depression   . GERD (gastroesophageal reflux disease)   . Glaucoma   . Hypertension   . Hypothyroidism   . Morbid obesity ([place])   . OSA (obstructive sleep apnea)    Dr Joya Gaskins  . Sleep apnea    wears cpap  . Thyroid disease   . Urinary incontinence     Past Surgical History:  Procedure Laterality Date  . abdominal supracervical hysterectomy with bilateral salpingo-oophorectomy     fibroid uterus  . ANKLE SURGERY  4/10   rt  . BREAST REDUCTION SURGERY    . CATARACT EXTRACTION    . COLONOSCOPY    . HERNIA REPAIR  [id number]   umbillical  . HYSTEROSCOPY N/A 07/25/2019   Procedure: HYSTEROSCOPY OF CERVIX WITH ULTRASOUND GUIDANCE/KS;  Surgeon: Salvadore Dom, MD;  Location: [facility];  Service: Gynecology;  Laterality: N/A;  . INSERTION OF MESH N/A 01/07/2016   Procedure: INSERTION OF MESH;  Surgeon: Coralie Keens, MD;  Location: [facility];  Service: General;  Laterality: N/A;  . OPERATIVE ULTRASOUND N/A 07/25/2019   Procedure: OPERATIVE ULTRASOUND;  Surgeon: Salvadore Dom, MD;  Location: [facility];  Service: Gynecology;  Laterality: N/A;  . REDUCTION MAMMAPLASTY    . VENTRAL HERNIA REPAIR N/A 01/07/2016   Procedure: VENTRAL HERNIA REPAIR;  Surgeon: Coralie Keens, MD;  Location: [facility];  Service: General;  Laterality: N/A;    Family History  Problem Relation Age of Onset  . Breast cancer Mother   . Cancer Mother        breast  . Hypertension Mother   . Colon cancer Maternal Grandmother   . Cancer Maternal Grandmother        colon    Social History   Socioeconomic History  . Marital status: Single     Spouse name: Not on file  . Number of children: Not on file  . Years of education: Not on file  . Highest education level: Not on file  Occupational History  . Occupation: [employer]    Comment: used to work in Therapist, art  Tobacco Use  . Smoking status: Never Smoker  . Smokeless tobacco: Never Used  Vaping Use  . Vaping Use: Never used  Substance and Sexual Activity  . Alcohol use: No    Alcohol/week: 0.0 standard drinks  . Drug use: No  . Sexual activity: Not Currently    Partners: Male    Birth control/protection: Surgical  Other Topics Concern  . Not on file  Social History Narrative  . Not on file   Social Determinants of Health   Financial Resource Strain:   . Difficulty of Paying Living Expenses:   Food Insecurity:   . Worried About Charity fundraiser in the Last Year:   . Arboriculturist in the Last Year:   Transportation Needs:   . Film/video editor (Medical):   [name] Lack of Transportation (Non-Medical):   Physical Activity:   . Days of Exercise per Week:   . Minutes of Exercise per Session:   Stress:   . Feeling of Stress :   Social Connections:   . Frequency of Communication with Friends and Family:   . Frequency of Social Gatherings with Friends and Family:   . Attends Religious Services:   . Active Member of Clubs or Organizations:   . Attends Archivist Meetings:   [name] Marital Status:   Intimate Partner Violence:   . Fear of Current or Ex-Partner:   . Emotionally Abused:   [name] Physically Abused:   . Sexually Abused:       Review of systems:  All other review of systems negative except as mentioned in the HPI.   Physical Exam: Vitals:   10/26/19 0859  BP: (!) 150/84  Pulse: 69   Body mass index is 53.1 kg/m. Gen:      No acute distress HEENT:  sclera anicteric Abd:      soft, non-tender; no palpable masses, no distension Ext:    No edema Neuro: alert and oriented x 3 Psych: normal mood and affect  Data  Reviewed:  Reviewed labs, radiology imaging, old records and pertinent past GI work up   Assessment and Plan/Recommendations:  64 year old very pleasant female with history of hypertension,  OSA on CPAP, morbid obesity, hypothyroidism here for evaluation of rectal bleeding  Small-volume bright red blood per rectum likely secondary to hemorrhage from internal hemorrhoids but cannot exclude neoplastic lesion or malignancy given recent onset of symptoms and progressive worsening. Anusol suppository per rectum at bedtime as needed Continue high-fiber diet and increase water  intake Benefiber 1 tablespoon twice daily with meals Avoid NSAIDs  Given the last exam was 4 and half years ago, will proceed with colonoscopy for further evaluation  BMI greater than 50, will need to schedule the exam at [facility] endoscopy unit  The risks and benefits as well as alternatives of endoscopic procedure(s) have been discussed and reviewed. All questions answered. The patient agrees to proceed.  The patient was provided an opportunity to ask questions and all were answered. The patient agreed with the plan and demonstrated an understanding of the instructions.  Return after colonoscopy as needed  K. Denzil Magnuson , MD    CC: Plotnikov, Evie Lacks, MD

## 2019-10-27 ENCOUNTER — Encounter: Payer: Self-pay | Admitting: Gastroenterology

## 2019-12-14 ENCOUNTER — Encounter: Payer: Self-pay | Admitting: Internal Medicine

## 2019-12-14 ENCOUNTER — Other Ambulatory Visit (HOSPITAL_COMMUNITY): Payer: Managed Care, Other (non HMO)

## 2019-12-14 ENCOUNTER — Ambulatory Visit: Payer: Managed Care, Other (non HMO) | Admitting: Internal Medicine

## 2019-12-14 ENCOUNTER — Ambulatory Visit (INDEPENDENT_AMBULATORY_CARE_PROVIDER_SITE_OTHER): Payer: Managed Care, Other (non HMO)

## 2019-12-14 ENCOUNTER — Other Ambulatory Visit: Payer: Self-pay

## 2019-12-14 DIAGNOSIS — R159 Full incontinence of feces: Secondary | ICD-10-CM

## 2019-12-14 DIAGNOSIS — R32 Unspecified urinary incontinence: Secondary | ICD-10-CM | POA: Diagnosis not present

## 2019-12-14 DIAGNOSIS — R152 Fecal urgency: Secondary | ICD-10-CM | POA: Diagnosis not present

## 2019-12-14 DIAGNOSIS — I10 Essential (primary) hypertension: Secondary | ICD-10-CM

## 2019-12-14 DIAGNOSIS — E559 Vitamin D deficiency, unspecified: Secondary | ICD-10-CM

## 2019-12-14 NOTE — Patient Instructions (Addendum)
Try Imodium AD for stool Try apple cider vinegar   Liraglutide injection (Weight Management) What is this medicine? LIRAGLUTIDE (LIR a GLOO tide) is used to help people lose weight and maintain weight loss. It is used with a reduced-calorie diet and exercise. This medicine may be used for other purposes; ask your health care provider or pharmacist if you have questions. COMMON BRAND NAME(S): Saxenda What should I tell my health care provider before I take this medicine? They need to know if you have any of these conditions:  endocrine tumors (MEN 2) or if someone in your family had these tumors  gallbladder disease  high cholesterol  history of alcohol abuse problem  history of pancreatitis  kidney disease or if you are on dialysis  liver disease  previous swelling of the tongue, face, or lips with difficulty breathing, difficulty swallowing, hoarseness, or tightening of the throat  stomach problems  suicidal thoughts, plans, or attempt; a previous suicide attempt by you or a family member  thyroid cancer or if someone in your family had thyroid cancer  an unusual or allergic reaction to liraglutide, other medicines, foods, dyes, or preservatives  pregnant or trying to get pregnant  breast-feeding How should I use this medicine? This medicine is for injection under the skin of your upper leg, stomach area, or upper arm. You will be taught how to prepare and give this medicine. Use exactly as directed. Take your medicine at regular intervals. Do not take it more often than directed. This drug comes with INSTRUCTIONS FOR USE. Ask your pharmacist for directions on how to use this drug. Read the information carefully. Talk to your pharmacist or health care provider if you have questions. It is important that you put your used needles and syringes in a special sharps container. Do not put them in a trash can. If you do not have a sharps container, call your pharmacist or  healthcare provider to get one. A special MedGuide will be given to you by the pharmacist with each prescription and refill. Be sure to read this information carefully each time. Talk to your pediatrician regarding the use of this medicine in children. Special care may be needed. Overdosage: If you think you have taken too much of this medicine contact a poison control center or emergency room at once. NOTE: This medicine is only for you. Do not share this medicine with others. What if I miss a dose? If you miss a dose, take it as soon as you can. If it is almost time for your next dose, take only that dose. Do not take double or extra doses. If you miss your dose for 3 days or more, call your doctor or health care professional to talk about how to restart this medicine. What may interact with this medicine?  insulin and other medicines for diabetes This list may not describe all possible interactions. Give your health care provider a list of all the medicines, herbs, non-prescription drugs, or dietary supplements you use. Also tell them if you smoke, drink alcohol, or use illegal drugs. Some items may interact with your medicine. What should I watch for while using this medicine? Visit your doctor or health care professional for regular checks on your progress. Drink plenty of fluids while taking this medicine. Check with your doctor or health care professional if you get an attack of severe diarrhea, nausea, and vomiting. The loss of too much body fluid can make it dangerous for you to take this  medicine. This medicine may affect blood sugar levels. Ask your healthcare provider if changes in diet or medicines are needed if you have diabetes. Patients and their families should watch out for worsening depression or thoughts of suicide. Also watch out for sudden changes in feelings such as feeling anxious, agitated, panicky, irritable, hostile, aggressive, impulsive, severely restless, overly excited  and hyperactive, or not being able to sleep. If this happens, especially at the beginning of treatment or after a change in dose, call your health care professional. Women should inform their health care provider if they wish to become pregnant or think they might be pregnant. Losing weight while pregnant is not advised and may cause harm to the unborn child. Talk to your health care provider for more information. What side effects may I notice from receiving this medicine? Side effects that you should report to your doctor or health care professional as soon as possible:  allergic reactions like skin rash, itching or hives, swelling of the face, lips, or tongue  breathing problems  diarrhea that continues or is severe  lump or swelling on the neck  severe nausea  signs and symptoms of infection like fever or chills; cough; sore throat; pain or trouble passing urine  signs and symptoms of low blood sugar such as feeling anxious; confusion; dizziness; increased hunger; unusually weak or tired; increased sweating; shakiness; cold, clammy skin; irritable; headache; blurred vision; fast heartbeat; loss of consciousness  signs and symptoms of kidney injury like trouble passing urine or change in the amount of urine  trouble swallowing  unusual stomach upset or pain  vomiting Side effects that usually do not require medical attention (report to your doctor or health care professional if they continue or are bothersome):  constipation  decreased appetite  diarrhea  fatigue  headache  nausea  pain, redness, or irritation at site where injected  stomach upset  stuffy or runny nose This list may not describe all possible side effects. Call your doctor for medical advice about side effects. You may report side effects to FDA at 1-800-FDA-1088. Where should I keep my medicine? Keep out of the reach of children. Store unopened pen in a refrigerator between 2 and 8 degrees C (36 and  46 degrees F). Do not freeze or use if the medicine has been frozen. Protect from light and excessive heat. After you first use the pen, it can be stored at room temperature between 15 and 30 degrees C (59 and 86 degrees F) or in a refrigerator. Throw away your used pen after 30 days or after the expiration date, whichever comes first. Do not store your pen with the needle attached. If the needle is left on, medicine may leak from the pen. NOTE: This sheet is a summary. It may not cover all possible information. If you have questions about this medicine, talk to your doctor, pharmacist, or health care provider.  2020 Elsevier/Gold Standard (2019-03-09 21:16:59)

## 2019-12-14 NOTE — Assessment & Plan Note (Signed)
Take meds this am

## 2019-12-14 NOTE — Assessment & Plan Note (Signed)
C/o stools after each meal, urgency incontinence; no diarrhea x years (?post-hysterectomy). Imodium prn Abd X ray Colonoscopy is pending

## 2019-12-14 NOTE — Assessment & Plan Note (Signed)
Vit D 

## 2019-12-14 NOTE — Assessment & Plan Note (Signed)
Saxenda info Surgical options -  Pt declined

## 2019-12-14 NOTE — Progress Notes (Signed)
Subjective:  Patient ID: Tracy Sampson, female    DOB: 05/12/1956  Age: 64 y.o. MRN: [id number]  CC: No chief complaint on file.   HPI Tracy Sampson presents for HTN, hypothyroidism, obesity. She did not take her BP meds this am yet. BP nl at home. C/o stools after each meal, urgency incontinence; no diarrhea x years (?post-hysterectomy).  Outpatient Medications Prior to Visit  Medication Sig Dispense Refill  . amLODipine (NORVASC) 5 MG tablet TAKE 1 TABLET(5 MG) BY MOUTH DAILY (Patient taking differently: Take 5 mg by mouth daily. ) 90 tablet 1  . cholecalciferol (VITAMIN D3) 25 MCG (1000 UNIT) tablet Take 1,000 Units by mouth daily.    . dorzolamide-timolol (COSOPT) 22.3-6.8 MG/ML ophthalmic solution Place 1 drop into both eyes 2 (two) times daily.     [name] levothyroxine (SYNTHROID) 150 MCG tablet TAKE 1 TABLET(150 MCG) BY MOUTH DAILY BEFORE BREAKFAST (Patient taking differently: Take 150 mcg by mouth daily before breakfast. ) 90 tablet 1  . losartan (COZAAR) 100 MG tablet TAKE 1 TABLET(100 MG) BY MOUTH DAILY (Patient taking differently: Take 100 mg by mouth daily. ) 90 tablet 3  . Na Sulfate-K Sulfate-Mg Sulf (SUPREP BOWEL PREP KIT) 17.5-3.13-1.6 GM/177ML SOLN Take 1 kit by mouth as directed. 324 mL 0  . sertraline (ZOLOFT) 100 MG tablet TAKE 1 TABLET(100 MG) BY MOUTH DAILY (Patient taking differently: Take 100 mg by mouth daily. ) 90 tablet 3  . vitamin B-12 (CYANOCOBALAMIN) 500 MCG tablet Take 500 mcg by mouth daily.     No facility-administered medications prior to visit.    ROS: Review of Systems  Constitutional: Positive for unexpected weight change. Negative for activity change, appetite change, chills and fatigue.  HENT: Negative for congestion, mouth sores and sinus pressure.   Eyes: Negative for visual disturbance.  Respiratory: Negative for cough and chest tightness.   Gastrointestinal: Negative for abdominal pain and nausea.  Genitourinary: Negative for difficulty  urinating, frequency and vaginal pain.  Musculoskeletal: Negative for back pain and gait problem.  Skin: Negative for pallor and rash.  Neurological: Negative for dizziness, tremors, weakness, numbness and headaches.  Psychiatric/Behavioral: Negative for confusion, sleep disturbance and suicidal ideas.    Objective:  BP (!) 170/92 (BP Location: Right Arm, Patient Position: Sitting, Cuff Size: Large)   Pulse 84   Temp 97.9 F (36.6 C) (Oral)   Ht _0  (1.676 m)   Wt (!) 331 lb (150.1 kg)   LMP  (LMP Unknown)   SpO2 92%   BMI 53.42 kg/m   BP Readings from Last 3 Encounters:  12/14/19 (!) 170/92  10/26/19 (!) 150/84  09/05/19 138/86    Wt Readings from Last 3 Encounters:  12/14/19 (!) 331 lb (150.1 kg)  10/26/19 (!) 329 lb (149.2 kg)  09/05/19 (!) 330 lb (149.7 kg)    Physical Exam Constitutional:      General: She is not in acute distress.    Appearance: She is well-developed. She is obese.  HENT:     Head: Normocephalic.     Right Ear: External ear normal.     Left Ear: External ear normal.     Nose: Nose normal.  Eyes:     General:        Right eye: No discharge.        Left eye: No discharge.     Conjunctiva/sclera: Conjunctivae normal.     Pupils: Pupils are equal, round, and reactive to light.  Neck:  Thyroid: No thyromegaly.     Vascular: No JVD.     Trachea: No tracheal deviation.  Cardiovascular:     Rate and Rhythm: Normal rate and regular rhythm.     Heart sounds: Normal heart sounds.  Pulmonary:     Effort: No respiratory distress.     Breath sounds: No stridor. No wheezing.  Abdominal:     General: Bowel sounds are normal. There is no distension.     Palpations: Abdomen is soft. There is no mass.     Tenderness: There is no abdominal tenderness. There is no guarding or rebound.  Musculoskeletal:        General: No tenderness.     Cervical back: Normal range of motion and neck supple.  Lymphadenopathy:     Cervical: No cervical adenopathy.    Skin:    Findings: No erythema or rash.  Neurological:     Cranial Nerves: No cranial nerve deficit.     Motor: No abnormal muscle tone.     Coordination: Coordination normal.     Deep Tendon Reflexes: Reflexes normal.  Psychiatric:        Behavior: Behavior normal.        Thought Content: Thought content normal.        Judgment: Judgment normal.     Lab Results  Component Value Date   WBC 7.6 09/05/2019   HGB 12.9 09/05/2019   HCT 40.6 09/05/2019   PLT 265.0 09/05/2019   GLUCOSE 147 (H) 09/05/2019   CHOL 191 10/27/2018   TRIG 109.0 10/27/2018   HDL 40.70 10/27/2018   LDLDIRECT 149.3 10/23/2010   LDLCALC 128 (H) 10/27/2018   ALT 21 10/27/2018   AST 19 10/27/2018   NA 143 09/05/2019   K 4.2 09/05/2019   CL 106 09/05/2019   CREATININE 0.79 09/05/2019   BUN 12 09/05/2019   CO2 28 09/05/2019   TSH 2.32 05/08/2019   INR 1.1 (H) 09/05/2019   HGBA1C 5.7 05/09/2018    [place] Intraoperative  Result Date: 07/25/2019 CLINICAL DATA:  Ultrasound was provided for use by the ordering physician, and a technical charge was applied by the performing facility.  No radiologist interpretation/professional services rendered.    Assessment & Plan:   Walker Kehr, MD

## 2019-12-14 NOTE — Assessment & Plan Note (Signed)
urgency incontinence (?post-hysterectomy) F/u w/GYN

## 2019-12-16 ENCOUNTER — Other Ambulatory Visit (HOSPITAL_COMMUNITY): Admission: RE | Admit: 2019-12-16 | Payer: Managed Care, Other (non HMO) | Source: Ambulatory Visit

## 2019-12-18 ENCOUNTER — Telehealth: Payer: Self-pay

## 2019-12-18 NOTE — Progress Notes (Signed)
Attempted to call patient regarding COVID results status. 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 Patient advised to email health at work at email provided to her to obtain a copy of her results to bring in with her to her appt tomorrow. If patient unable to obtain results it is explained that she would have to be rescheduled for a different date thru her GI office. Pt given contact info for [facility] to call back with questions or any clarification needs.

## 2019-12-18 NOTE — Telephone Encounter (Signed)
Left pt a vm to call back

## 2019-12-18 NOTE — Telephone Encounter (Signed)
New message   Returning call back to the CMA from today. 

## 2019-12-19 ENCOUNTER — Encounter (HOSPITAL_COMMUNITY)
Admission: RE | Disposition: A | Discharge status: Home / Self Care | Payer: Self-pay | Source: Home / Self Care | Attending: Gastroenterology

## 2019-12-19 ENCOUNTER — Ambulatory Visit (HOSPITAL_COMMUNITY)
Admission: RE | Admit: 2019-12-19 | Discharge: 2019-12-19 | Disposition: A | Discharge status: Home / Self Care | Payer: Managed Care, Other (non HMO) | Attending: Gastroenterology | Admitting: Gastroenterology

## 2019-12-19 ENCOUNTER — Ambulatory Visit (HOSPITAL_COMMUNITY): Payer: Managed Care, Other (non HMO) | Admitting: Anesthesiology

## 2019-12-19 ENCOUNTER — Encounter (HOSPITAL_COMMUNITY): Payer: Self-pay | Admitting: Gastroenterology

## 2019-12-19 DIAGNOSIS — Z79899 Other long term (current) drug therapy: Secondary | ICD-10-CM | POA: Diagnosis not present

## 2019-12-19 DIAGNOSIS — K635 Polyp of colon: Secondary | ICD-10-CM | POA: Diagnosis not present

## 2019-12-19 DIAGNOSIS — K648 Other hemorrhoids: Secondary | ICD-10-CM | POA: Diagnosis not present

## 2019-12-19 DIAGNOSIS — H409 Unspecified glaucoma: Secondary | ICD-10-CM | POA: Insufficient documentation

## 2019-12-19 DIAGNOSIS — K921 Melena: Secondary | ICD-10-CM | POA: Insufficient documentation

## 2019-12-19 DIAGNOSIS — D126 Benign neoplasm of colon, unspecified: Secondary | ICD-10-CM | POA: Diagnosis not present

## 2019-12-19 DIAGNOSIS — F329 Major depressive disorder, single episode, unspecified: Secondary | ICD-10-CM | POA: Diagnosis not present

## 2019-12-19 DIAGNOSIS — G4733 Obstructive sleep apnea (adult) (pediatric): Secondary | ICD-10-CM | POA: Insufficient documentation

## 2019-12-19 DIAGNOSIS — Z6841 Body Mass Index (BMI) 40.0 and over, adult: Secondary | ICD-10-CM | POA: Diagnosis not present

## 2019-12-19 DIAGNOSIS — K5730 Diverticulosis of large intestine without perforation or abscess without bleeding: Secondary | ICD-10-CM | POA: Insufficient documentation

## 2019-12-19 DIAGNOSIS — K625 Hemorrhage of anus and rectum: Secondary | ICD-10-CM

## 2019-12-19 DIAGNOSIS — E039 Hypothyroidism, unspecified: Secondary | ICD-10-CM | POA: Diagnosis not present

## 2019-12-19 DIAGNOSIS — I10 Essential (primary) hypertension: Secondary | ICD-10-CM | POA: Insufficient documentation

## 2019-12-19 HISTORY — PX: POLYPECTOMY: SHX5525

## 2019-12-19 HISTORY — PX: COLONOSCOPY WITH PROPOFOL: SHX5780

## 2019-12-19 SURGERY — COLONOSCOPY WITH PROPOFOL
Anesthesia: Monitor Anesthesia Care

## 2019-12-19 MED ORDER — PROPOFOL 10 MG/ML IV BOLUS
INTRAVENOUS | Status: DC | PRN
  Administered 2019-12-19: 20 mg via INTRAVENOUS

## 2019-12-19 MED ORDER — PROPOFOL 500 MG/50ML IV EMUL
INTRAVENOUS | Status: DC | PRN
  Administered 2019-12-19: 125 ug/kg/min via INTRAVENOUS

## 2019-12-19 MED ORDER — ONDANSETRON HCL 4 MG/2ML IJ SOLN
INTRAMUSCULAR | Status: DC | PRN
  Administered 2019-12-19: 4 mg via INTRAVENOUS

## 2019-12-19 MED ORDER — SODIUM CHLORIDE 0.9 % IV SOLN: INTRAVENOUS | Status: DC

## 2019-12-19 MED ORDER — LACTATED RINGERS IV SOLN: INTRAVENOUS | Status: DC

## 2019-12-19 SURGICAL SUPPLY — 21 items

## 2019-12-19 NOTE — H&P (Signed)
[facility] History and Physical   Primary Care Physician:  Plotnikov, Evie Lacks, MD   Reason for Procedure:   Rectal bleeding  Plan:    Colonoscopy with further evaluation    HPI: Tracy Sampson is a 64 y.o. female here for colonoscopy for evaluation of rectal bleeding.   The risks and benefits as well as alternatives of endoscopic procedure(s) have been discussed and reviewed. All questions answered. The patient agrees to proceed.    Past Medical History:  Diagnosis Date  . Allergic rhinitis   . Allergy   . Arthritis   . Cataract    bilateral removed  . Depression   . GERD (gastroesophageal reflux disease)   . Glaucoma   . Hypertension   . Hypothyroidism   . Morbid obesity ([place])   . OSA (obstructive sleep apnea)    Dr Joya Gaskins  . Sleep apnea    wears cpap  . Thyroid disease   . Urinary incontinence     Past Surgical History:  Procedure Laterality Date  . abdominal supracervical hysterectomy with bilateral salpingo-oophorectomy     fibroid uterus  . ANKLE SURGERY  4/10   rt  . BREAST REDUCTION SURGERY    . CATARACT EXTRACTION    . COLONOSCOPY    . HERNIA REPAIR  9163   umbillical  . HYSTEROSCOPY N/A 07/25/2019   Procedure: HYSTEROSCOPY OF CERVIX WITH ULTRASOUND GUIDANCE/KS;  Surgeon: Salvadore Dom, MD;  Location: [facility];  Service: Gynecology;  Laterality: N/A;  . INSERTION OF MESH N/A 01/07/2016   Procedure: INSERTION OF MESH;  Surgeon: Coralie Keens, MD;  Location: [facility];  Service: General;  Laterality: N/A;  . OPERATIVE ULTRASOUND N/A 07/25/2019   Procedure: OPERATIVE ULTRASOUND;  Surgeon: Salvadore Dom, MD;  Location: [place];  Service: Gynecology;  Laterality: N/A;  . REDUCTION MAMMAPLASTY    . VENTRAL HERNIA REPAIR N/A 01/07/2016   Procedure: VENTRAL HERNIA REPAIR;  Surgeon: Coralie Keens, MD;  Location: [facility];  Service: General;  Laterality: N/A;    Prior to Admission medications   Medication Sig Start Date End Date  Taking? Authorizing Provider  amLODipine (NORVASC) 5 MG tablet TAKE 1 TABLET(5 MG) BY MOUTH DAILY Patient taking differently: Take 5 mg by mouth daily.  07/25/19  Yes Plotnikov, Evie Lacks, MD  cholecalciferol (VITAMIN D3) 25 MCG (1000 UNIT) tablet Take 1,000 Units by mouth daily.   Yes [provider]  dorzolamide-timolol (COSOPT) 22.3-6.8 MG/ML ophthalmic solution Place 1 drop into both eyes 2 (two) times daily.  10/22/17  Yes [provider]  levothyroxine (SYNTHROID) 150 MCG tablet TAKE 1 TABLET(150 MCG) BY MOUTH DAILY BEFORE BREAKFAST Patient taking differently: Take 150 mcg by mouth daily before breakfast.  07/05/19  Yes Plotnikov, Evie Lacks, MD  losartan (COZAAR) 100 MG tablet TAKE 1 TABLET(100 MG) BY MOUTH DAILY Patient taking differently: Take 100 mg by mouth daily.  08/14/19  Yes Plotnikov, Evie Lacks, MD  Na Sulfate-K Sulfate-Mg Sulf (SUPREP BOWEL PREP KIT) 17.5-3.13-1.6 GM/177ML SOLN Take 1 kit by mouth as directed. 10/26/19  Yes Keily Lepp, Venia Minks, MD  sertraline (ZOLOFT) 100 MG tablet TAKE 1 TABLET(100 MG) BY MOUTH DAILY Patient taking differently: Take 100 mg by mouth daily.  06/23/19  Yes Plotnikov, Evie Lacks, MD  vitamin B-12 (CYANOCOBALAMIN) 500 MCG tablet Take 500 mcg by mouth daily.   Yes [provider]    Current Facility-Administered Medications  Medication Dose Route Frequency Provider Last Rate Last Admin  . 0.9 %  sodium chloride infusion   Intravenous Continuous Sebastiana Wuest V, MD      . lactated ringers infusion   Intravenous Continuous Mauri Pole, MD 10 mL/hr at 12/19/19 1030 New Bag at 12/19/19 1030    Allergies as of 10/26/2019 - Review Complete 10/26/2019  Allergen Reaction Noted  . Tribenzor [olmesartan-amlodipine-hctz]  06/12/2011    Family History  Problem Relation Age of Onset  . Breast cancer Mother   . Cancer Mother        breast  . Hypertension Mother   . Colon cancer Maternal Grandmother   . Cancer Maternal  Grandmother        colon    Social History   Socioeconomic History  . Marital status: Single    Spouse name: Not on file  . Number of children: Not on file  . Years of education: Not on file  . Highest education level: Not on file  Occupational History  . Occupation: [employer]    Comment: used to work in Therapist, art  Tobacco Use  . Smoking status: Never Smoker  . Smokeless tobacco: Never Used  Vaping Use  . Vaping Use: Never used  Substance and Sexual Activity  . Alcohol use: No    Alcohol/week: 0.0 standard drinks  . Drug use: No  . Sexual activity: Not Currently    Partners: Male    Birth control/protection: Surgical  Other Topics Concern  . Not on file  Social History Narrative  . Not on file   Social Determinants of Health   Financial Resource Strain:   . Difficulty of Paying Living Expenses:   Food Insecurity:   . Worried About Charity fundraiser in the Last Year:   . Arboriculturist in the Last Year:   Transportation Needs:   . Film/video editor (Medical):   [name] Lack of Transportation (Non-Medical):   Physical Activity:   . Days of Exercise per Week:   . Minutes of Exercise per Session:   Stress:   . Feeling of Stress :   Social Connections:   . Frequency of Communication with Friends and Family:   . Frequency of Social Gatherings with Friends and Family:   . Attends Religious Services:   . Active Member of Clubs or Organizations:   . Attends Archivist Meetings:   [name] Marital Status:   Intimate Partner Violence:   . Fear of Current or Ex-Partner:   . Emotionally Abused:   [name] Physically Abused:   . Sexually Abused:     Review of Systems:  All other review of systems negative except as mentioned in the HPI.  Physical Exam: Vital signs in last 24 hours: Temp:  [98.1 F (36.7 C)] 98.1 F (36.7 C) (08/03 0945) Pulse Rate:  [72] 72 (08/03 0945) Resp:  [28] 28 (08/03 0945) BP: (161)/(68) 161/68 (08/03 0945) SpO2:  [97 %] 97 % (08/03  0945) Weight:  [149.7 kg] 149.7 kg (08/03 0945)   General:   Alert,  Well-developed, well-nourished, pleasant and cooperative in NAD Lungs:  Clear throughout to auscultation.   Heart:  Regular rate and rhythm; no murmurs, clicks, rubs,  or gallops. Abdomen:  Soft, nontender and nondistended. Normal bowel sounds.   Neuro/Psych:  Alert and cooperative. Normal mood and affect. A and O x 3   K. Denzil Magnuson , MD [phone]

## 2019-12-19 NOTE — Telephone Encounter (Signed)
See result notes. 

## 2019-12-19 NOTE — Anesthesia Preprocedure Evaluation (Signed)
Anesthesia Evaluation  Patient identified by MRN, date of birth, ID band Patient awake    Reviewed: Allergy & Precautions, NPO status , Patient's Chart, lab work & pertinent test results  Airway Mallampati: II       Dental no notable dental hx. (+) Teeth Intact   Pulmonary sleep apnea ,    Pulmonary exam normal breath sounds clear to auscultation       Cardiovascular hypertension, Pt. on medications Normal cardiovascular exam Rhythm:Regular Rate:Normal     Neuro/Psych PSYCHIATRIC DISORDERS Depression negative neurological ROS     GI/Hepatic Neg liver ROS,   Endo/Other  Hypothyroidism   Renal/GU negative Renal ROS  negative genitourinary   Musculoskeletal   Abdominal (+) + obese,   Peds  Hematology negative hematology ROS (+)   Anesthesia Other Findings   Reproductive/Obstetrics                             Anesthesia Physical Anesthesia Plan  ASA: III  Anesthesia Plan: MAC   Post-op Pain Management:    Induction:   PONV Risk Score and Plan: TIVA and Propofol infusion  Airway Management Planned:   Additional Equipment: None  Intra-op Plan:   Post-operative Plan:   Informed Consent: I have reviewed the patients History and Physical, chart, labs and discussed the procedure including the risks, benefits and alternatives for the proposed anesthesia with the patient or authorized representative who has indicated his/her understanding and acceptance.       Plan Discussed with: CRNA  Anesthesia Plan Comments:         Anesthesia Quick Evaluation

## 2019-12-19 NOTE — Transfer of Care (Signed)
Immediate Anesthesia Transfer of Care Note  Patient: Tracy Sampson  Procedure(s) Performed: COLONOSCOPY WITH PROPOFOL (N/A ) POLYPECTOMY  Patient Location: PACU and Endoscopy Unit  Anesthesia Type:MAC  Level of Consciousness: awake, alert  and oriented  Airway & Oxygen Therapy: Patient Spontanous Breathing and Patient connected to face mask oxygen  Post-op Assessment: Report given to RN and Post -op Vital signs reviewed and stable  Post vital signs: Reviewed and stable  Last Vitals:  Vitals Value Taken Time  BP    Temp    Pulse    Resp    SpO2      Last Pain:  Vitals:   12/19/19 0945  TempSrc: Oral         Complications: No complications documented.

## 2019-12-19 NOTE — Anesthesia Procedure Notes (Signed)
Procedure Name: MAC Date/Time: 12/19/2019 11:40 AM Performed by: Maxwell Caul, CRNA Pre-anesthesia Checklist: Patient identified, Emergency Drugs available, Suction available and Patient being monitored Oxygen Delivery Method: Simple face mask

## 2019-12-19 NOTE — Op Note (Signed)
[facility] Patient Name: Tracy Sampson Procedure Date: 12/19/2019 MRN: [id number] Attending MD: Mauri Pole , MD Date of Birth: 01/12/1956 CSN: [id number] Age: 64 Admit Type: Outpatient Procedure:                Colonoscopy Indications:              Evaluation of unexplained GI bleeding presenting                            with Hematochezia Providers:                Mauri Pole, MD, Benay Pillow, RN, Lonnie                            "Tyna Jaksch, Technician Referring MD:              Medicines:                Monitored Anesthesia Care Complications:            No immediate complications. Estimated Blood Loss:     Estimated blood loss was minimal. Procedure:                Pre-Anesthesia Assessment:                           - Prior to the procedure, a History and Physical                            was performed, and patient medications and                            allergies were reviewed. The patient's tolerance of                            previous anesthesia was also reviewed. The risks                            and benefits of the procedure and the sedation                            options and risks were discussed with the patient.                            All questions were answered, and informed consent                            was obtained. Prior Anticoagulants: The patient has                            taken no previous anticoagulant or antiplatelet                            agents. ASA Grade Assessment: III - A patient with  severe systemic disease. After reviewing the risks                            and benefits, the patient was deemed in                            satisfactory condition to undergo the procedure.                           After obtaining informed consent, the colonoscope                            was passed under direct vision. Throughout the                             procedure, the patient's blood pressure, pulse, and                            oxygen saturations were monitored continuously. The                            [id number]-[id number] ([id number] Olympus pediatric colonscope                            was introduced through the anus and advanced to the                            the cecum, identified by appendiceal orifice and                            ileocecal valve. The colonoscopy was performed                            without difficulty. The patient tolerated the                            procedure well. The quality of the bowel                            preparation was good. The ileocecal valve,                            appendiceal orifice, and rectum were photographed. Scope In: 11:50:08 AM Scope Out: 12:02:57 PM Scope Withdrawal Time: 0 hours 9 minutes 17 seconds  Total Procedure Duration: 0 hours 12 minutes 49 seconds  Findings:      The perianal and digital rectal examinations were normal.      Two sessile polyps were found in the descending colon and transverse       colon. The polyps were 1 to 2 mm in size. These polyps were removed with       a cold biopsy forceps. Resection and retrieval were complete.      Multiple small and large-mouthed diverticula were found in the sigmoid       colon and descending colon.  Non-bleeding internal hemorrhoids were found during retroflexion. The       hemorrhoids were medium-sized. Impression:               - Two 1 to 2 mm polyps in the descending colon and                            in the transverse colon, removed with a cold biopsy                            forceps. Resected and retrieved.                           - Diverticulosis in the sigmoid colon and in the                            descending colon.                           - Non-bleeding internal hemorrhoids likely etiology                            of rectal bleeding. Moderate Sedation:      Not Applicable - Patient had care  per Anesthesia. Recommendation:           - Patient has a contact number available for                            emergencies. The signs and symptoms of potential                            delayed complications were discussed with the                            patient. Return to normal activities tomorrow.                            Written discharge instructions were provided to the                            patient.                           - Resume previous diet.                           - Continue present medications.                           - Await pathology results.                           - Repeat colonoscopy in 5-10 years for surveillance                            based on pathology results.                           -  Return to GI clinic at the next available                            appointment for hemorrhoidal band ligation. Procedure Code(s):        --- Professional ---                           [id number], Colonoscopy, flexible; with biopsy, single                            or multiple Diagnosis Code(s):        --- Professional ---                           K63.5, Polyp of colon                           K64.8, Other hemorrhoids                           K92.1, Melena (includes Hematochezia)                           K57.30, Diverticulosis of large intestine without                            perforation or abscess without bleeding CPT copyright 2019 American Medical Association. All rights reserved. The codes documented in this report are preliminary and upon coder review may  be revised to meet current compliance requirements. Mauri Pole, MD 12/19/2019 12:12:58 PM This report has been signed electronically. Number of Addenda: 0

## 2019-12-19 NOTE — Discharge Instructions (Signed)

## 2019-12-19 NOTE — Anesthesia Postprocedure Evaluation (Signed)
Anesthesia Post Note  Patient: Tracy Sampson  Procedure(s) Performed: COLONOSCOPY WITH PROPOFOL (N/A ) POLYPECTOMY     Patient location during evaluation: Endoscopy Anesthesia Type: MAC Level of consciousness: awake Pain management: pain level controlled Vital Signs Assessment: post-procedure vital signs reviewed and stable Respiratory status: spontaneous breathing Cardiovascular status: stable Postop Assessment: no apparent nausea or vomiting Anesthetic complications: no   No complications documented.  Last Vitals:  Vitals:   12/19/19 1220 12/19/19 1230  BP: 108/70 (!) 168/90  Pulse: 65 62  Resp: 16 16  Temp:    SpO2: 100% 94%    Last Pain:  Vitals:   12/19/19 1230  TempSrc:   PainSc: 0-No pain   Pain Goal:                   Huston Foley

## 2019-12-19 NOTE — Progress Notes (Signed)
Left message for patient to call [facility] endo regarding no COVID test result in chart for procedure 12/19/19 at 1030 with Dr. Silverio Decamp. Requested patient to call unit back. Multiple attempts to contact patient by Endo and office staff yesterday.

## 2019-12-20 ENCOUNTER — Encounter (HOSPITAL_COMMUNITY): Payer: Self-pay | Admitting: Gastroenterology

## 2019-12-21 LAB — SURGICAL PATHOLOGY

## 2019-12-26 ENCOUNTER — Encounter: Payer: Self-pay | Admitting: Gastroenterology

## 2020-01-03 ENCOUNTER — Other Ambulatory Visit: Payer: Self-pay | Admitting: Internal Medicine

## 2020-01-15 ENCOUNTER — Other Ambulatory Visit: Payer: Self-pay | Admitting: Internal Medicine

## 2020-03-01 ENCOUNTER — Other Ambulatory Visit (HOSPITAL_BASED_OUTPATIENT_CLINIC_OR_DEPARTMENT_OTHER): Payer: Self-pay | Admitting: Internal Medicine

## 2020-03-01 DIAGNOSIS — Z1231 Encounter for screening mammogram for malignant neoplasm of breast: Secondary | ICD-10-CM

## 2020-03-04 ENCOUNTER — Telehealth: Payer: Self-pay

## 2020-03-04 NOTE — Telephone Encounter (Signed)
Patient is calling in regards to not being able to hold urine.

## 2020-03-04 NOTE — Telephone Encounter (Signed)
Spoke with patient. Patient states she had been experiencing some intermittent urinary incontinent, states she discussed this at her AEX 03/2019. She reports it has increased over the past couple of weeks, then more over the past weekend. Denies flank pain, fever/chills, dysuria, hematuria, vag odor, d/c or bleeding. Requesting OV. Patient declined OV for today, 10/19, 10/20 and 10/21. OV scheduled for 10/22 at 8:30am with Dr. Talbert Nan. Instructed patient to return call to office for earlier OV if new symptoms developed or symptoms worsened. She will will keep AEX as scheduled for 03/28/20.   Routing to provider for final review.

## 2020-03-07 NOTE — Progress Notes (Signed)
GYNECOLOGY  VISIT   HPI: 64 y.o.   Single Black or African American Not Hispanic or Latino  female   G0P0000 with No LMP recorded (lmp unknown). Patient has had a hysterectomy. H/O [facility].    here for increased urinary incontinence. Patient states that when she stand up and during the night she is unable to hold urine. Per patient uses the restroom 5-6 times throughout the night.    She has a long h/o mixed incontinence, nocturia and enuresis. She was previously on ditropan XL 10 mg and myrbetriq.   Currently her bladder symptoms are worse at night. She is up 1-6 x at night to void. As soon as she stands up she empties her bladder, sometimes wakes up in a puddle. Daytime is okay, she works at home, goes to the bathroom regularly. No urgency to void during the day. Voids normal amounts. Some days she will leak on the way to the bathroom, small to large amounts. No leaking with coughing and sneezing. Feels that her urine stream is normal, feels empty when she voids.  Last weekend the leakage was much worse. Not as bad in the last few days.  She also has fecal incontinence. She has a BM 5 minutes after she eats. Sometimes it's liquid, sometimes it's formed. She can leak formed or liquid stool. Leaks stool daily, can be a small amount or a large amount. She has to run to restroom. She hasn't noticed a correlation with what she eats.  She drinks 16 oz of coffee a day.  She can't travel, she can't even go out to dinner. This is affecting her life.   Urine: trace RBC  GYNECOLOGIC HISTORY: No LMP recorded (lmp unknown). Patient has had a hysterectomy. Contraception:Hysterectomy Menopausal hormone therapy: none        OB History    Gravida  0   Para  0   Term  0   Preterm  0   AB  0   Living  0     SAB  0   TAB  0   Ectopic  0   Multiple  0   Live Births                 Patient Active Problem List   Diagnosis Date Noted   Polyp of transverse colon    Polyp of descending  colon    Stool incontinence 12/14/2019   Postmenopausal bleeding 07/11/2019   Shoulder pain, right 11/15/2017   OAB (overactive bladder) 11/15/2017   Contact dermatitis 07/07/2017   Head congestion 07/07/2017   Branch retinal vein occlusion of left eye with macular edema 05/06/2017   CME (cystoid macular edema), left 05/06/2017   Subconjunctival hemorrhage of right eye 12/04/2016   Low tension glaucoma of right eye, 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   Low tension glaucoma of left eye, moderate stage 09/28/2016   Family history of glaucoma 09/17/2016   Pseudophakia of both eyes 09/17/2016   Incarcerated ventral hernia 01/07/2016   Left ear pain 06/06/2015   Rectal bleeding 02/06/2015   Blood in stool 12/04/2014   Body odor 93/26/7124   Umbilical hernia 58/01/9832   Incontinence in female 08/16/2013   Trigger finger, acquired 08/16/2013   Well adult exam 01/11/2012   Nocturia 02/27/2011   Paresthesia 10/23/2010   Ventral hernia 03/01/2009   Vitamin D deficiency 08/28/2008   KNEE PAIN 08/28/2008   OBESITY, MORBID 09/23/2007   RASH AND OTHER NONSPECIFIC SKIN ERUPTION 09/07/2007   Hypothyroidism 05/10/2007  Depression 05/03/2007   Essential hypertension 05/03/2007   Allergic rhinitis 05/03/2007   GERD (gastroesophageal reflux disease) 05/03/2007   OSA on CPAP 05/03/2007   SUBMUCOUS LEIOMYOMA OF UTERUS 05/02/2007   GOITER, MULTINODULAR 05/02/2007   DYSPHAGIA UNSPECIFIED 05/02/2007    Past Medical History:  Diagnosis Date   Allergic rhinitis    Allergy    Arthritis    Cataract    bilateral removed   Depression    GERD (gastroesophageal reflux disease)    Glaucoma    Hypertension    Hypothyroidism    Morbid obesity (HCC)    OSA (obstructive sleep apnea)    Dr Joya Gaskins   Sleep apnea    wears cpap   Thyroid disease    Urinary incontinence     Past Surgical History:  Procedure Laterality Date   abdominal supracervical  hysterectomy with bilateral salpingo-oophorectomy     fibroid uterus   ANKLE SURGERY  4/10   rt   BREAST REDUCTION SURGERY     CATARACT EXTRACTION     COLONOSCOPY     COLONOSCOPY WITH PROPOFOL N/A 12/19/2019   Procedure: COLONOSCOPY WITH PROPOFOL;  Surgeon: Mauri Pole, MD;  Location: [facility];  Service: Endoscopy;  Laterality: N/A;   HERNIA REPAIR  [id number]   umbillical   HYSTEROSCOPY N/A 07/25/2019   Procedure: HYSTEROSCOPY OF CERVIX WITH ULTRASOUND GUIDANCE/KS;  Surgeon: Salvadore Dom, MD;  Location: [place];  Service: Gynecology;  Laterality: N/A;   INSERTION OF MESH N/A 01/07/2016   Procedure: INSERTION OF MESH;  Surgeon: Coralie Keens, MD;  Location: [facility];  Service: General;  Laterality: N/A;   OPERATIVE ULTRASOUND N/A 07/25/2019   Procedure: OPERATIVE ULTRASOUND;  Surgeon: Salvadore Dom, MD;  Location: [facility];  Service: Gynecology;  Laterality: N/A;   POLYPECTOMY  12/19/2019   Procedure: POLYPECTOMY;  Surgeon: Mauri Pole, MD;  Location: [facility];  Service: Endoscopy;;   REDUCTION MAMMAPLASTY     VENTRAL HERNIA REPAIR N/A 01/07/2016   Procedure: VENTRAL HERNIA REPAIR;  Surgeon: Coralie Keens, MD;  Location: [facility];  Service: General;  Laterality: N/A;    Current Outpatient Medications  Medication Sig Dispense Refill   amLODipine (NORVASC) 5 MG tablet TAKE 1 TABLET(5 MG) BY MOUTH DAILY 90 tablet 1   cholecalciferol (VITAMIN D3) 25 MCG (1000 UNIT) tablet Take 1,000 Units by mouth daily.     dorzolamide-timolol (COSOPT) 22.3-6.8 MG/ML ophthalmic solution Place 1 drop into both eyes 2 (two) times daily.      levothyroxine (SYNTHROID) 150 MCG tablet TAKE 1 TABLET(150 MCG) BY MOUTH DAILY BEFORE BREAKFAST 90 tablet 3   losartan (COZAAR) 100 MG tablet TAKE 1 TABLET(100 MG) BY MOUTH DAILY (Patient taking differently: Take 100 mg by mouth daily. ) 90 tablet 3   sertraline (ZOLOFT) 100 MG tablet TAKE 1 TABLET(100 MG) BY MOUTH DAILY (Patient  taking differently: Take 100 mg by mouth daily. ) 90 tablet 3   vitamin B-12 (CYANOCOBALAMIN) 500 MCG tablet Take 500 mcg by mouth daily.     No current facility-administered medications for this visit.     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  Tobacco Use   Smoking status: Never Smoker   Smokeless tobacco: Never Used  Vaping Use   Vaping Use: Never used  Substance and Sexual Activity   Alcohol use: No    Alcohol/week: 0.0 standard drinks   Drug use: No   Sexual activity: Not Currently    Partners: Male    Birth control/protection: Surgical  Other Topics Concern   Not on file  Social History Narrative   Not on file   Social Determinants of Health   Financial Resource Strain:    Difficulty of Paying Living Expenses: Not on file  Food Insecurity:    Worried About Charity fundraiser in the Last Year: Not on file   [place] in the Last Year: Not on file  Transportation Needs:    Lack of Transportation (Medical): Not on file   Lack of Transportation (Non-Medical): Not on file  Physical Activity:    Days of Exercise per Week: Not on file   Minutes of Exercise per Session: Not on file  Stress:    Feeling of Stress : Not on file  Social Connections:    Frequency of Communication with Friends and Family: Not on file   Frequency of Social Gatherings with Friends and Family: Not on file   Attends Religious Services: Not on file    Active Member of Clubs or Organizations: Not on file   Attends Archivist Meetings: Not on file   Marital Status: Not on file  Intimate Partner Violence:    Fear of Current or Ex-Partner: Not on file   Emotionally Abused: Not on file   Physically Abused: Not on file   Sexually Abused: Not on file    Review of Systems  Constitutional: Negative.   HENT: Negative.   Eyes: Negative.   Respiratory: Negative.   Cardiovascular: Negative.   Gastrointestinal: Negative.   Genitourinary: Positive for frequency and urgency.       Leaking urine   Musculoskeletal: Negative.   Skin: Negative.   Neurological: Negative.   Endo/Heme/Allergies: Negative.   Psychiatric/Behavioral: Negative.     PHYSICAL EXAMINATION:    BP 128/80 (BP Location: Right Arm, Patient Position: Sitting, Cuff Size: Large)    Pulse 80    Ht 5\' 6"  (1.676 m)    Wt (!) 326 lb (147.9 kg)    LMP  (LMP Unknown)    SpO2 96%    BMI 52.62 kg/m     General appearance: alert, cooperative and appears stated age Abdomen: soft, non-tender; non distended, no masses,  no organomegaly, obese  Pelvic: External genitalia:  no lesions              Urethra:  normal appearing urethra with no masses, tenderness or lesions              Bartholins and Skenes: normal                 Vagina: mildly atrophic appearing vagina with normal color and discharge, no lesions              Cervix: no lesions. Cervical stump prolapses to the introitus. No significant cystocele or rectocele.  Bimanual Exam:  Uterus:  uterus absent              Adnexa: no mass, fullness, tenderness              Rectovaginal: Yes.  .  Confirms.              Anus:  normal sphincter tone, no lesions  Kegel strength: 0/5  Chaperone was present for exam.  ASSESSMENT Urge urinary incontinence, enuresis, nocturia Fecal incontinence Loose stools No kegel strength    PLAN Try and cut down caffeine intake Metamucil for fecal  incontinence Pay attention to diet and BM's Information on kegels given Send for pelvic floor PT Start oxybutynin 10 mg xl (normal renal function) Send urine for ua, c&s F/U in one month   An After Visit Summary was printed and given to the patient.  Over 30 minutes in total patient care.

## 2020-03-08 ENCOUNTER — Other Ambulatory Visit: Payer: Self-pay

## 2020-03-08 ENCOUNTER — Encounter: Payer: Self-pay | Admitting: Obstetrics and Gynecology

## 2020-03-08 ENCOUNTER — Ambulatory Visit: Payer: Managed Care, Other (non HMO) | Admitting: Obstetrics and Gynecology

## 2020-03-08 VITALS — BP 128/80 | HR 80 | Ht 66.0 in | Wt 326.0 lb

## 2020-03-08 DIAGNOSIS — N8185 Cervical stump prolapse: Secondary | ICD-10-CM | POA: Diagnosis not present

## 2020-03-08 DIAGNOSIS — R159 Full incontinence of feces: Secondary | ICD-10-CM | POA: Diagnosis not present

## 2020-03-08 DIAGNOSIS — R195 Other fecal abnormalities: Secondary | ICD-10-CM | POA: Diagnosis not present

## 2020-03-08 DIAGNOSIS — R32 Unspecified urinary incontinence: Secondary | ICD-10-CM | POA: Diagnosis not present

## 2020-03-08 DIAGNOSIS — R152 Fecal urgency: Secondary | ICD-10-CM

## 2020-03-08 LAB — POCT URINALYSIS DIPSTICK
Bilirubin, UA: NEGATIVE
Glucose, UA: NEGATIVE
Ketones, UA: NEGATIVE
Leukocytes, UA: NEGATIVE
Nitrite, UA: NEGATIVE
Protein, UA: NEGATIVE
Urobilinogen, UA: 0.2 E.U./dL
pH, UA: 5 (ref 5.0–8.0)

## 2020-03-08 MED ORDER — OXYBUTYNIN CHLORIDE ER 10 MG PO TB24: 10.0000 mg | ORAL_TABLET | Freq: Every day | ORAL | 1 refills | Status: DC

## 2020-03-08 NOTE — Patient Instructions (Addendum)
Start metamucil 1 x a day, can go up to 3 x a day.    Kegel Exercises  Kegel exercises can help strengthen your pelvic floor muscles. The pelvic floor is a group of muscles that support your rectum, small intestine, and bladder. In females, pelvic floor muscles also help support the womb (uterus). These muscles help you control the flow of urine and stool. Kegel exercises are painless and simple, and they do not require any equipment. Your provider may suggest Kegel exercises to:  Improve bladder and bowel control.  Improve sexual response.  Improve weak pelvic floor muscles after surgery to remove the uterus (hysterectomy) or pregnancy (females).  Improve weak pelvic floor muscles after prostate gland removal or surgery (males). Kegel exercises involve squeezing your pelvic floor muscles, which are the same muscles you squeeze when you try to stop the flow of urine or keep from passing gas. The exercises can be done while sitting, standing, or lying down, but it is best to vary your position. Exercises How to do Kegel exercises: 1. Squeeze your pelvic floor muscles tight. You should feel a tight lift in your rectal area. If you are a female, you should also feel a tightness in your vaginal area. Keep your stomach, buttocks, and legs relaxed. 2. Hold the muscles tight for up to 10 seconds. 3. Breathe normally. 4. Relax your muscles. 5. Repeat as told by your health care provider. Repeat this exercise daily as told by your health care provider. Continue to do this exercise for at least 4-6 weeks, or for as long as told by your health care provider. You may be referred to a physical therapist who can help you learn more about how to do Kegel exercises. Depending on your condition, your health care provider may recommend:  Varying how long you squeeze your muscles.  Doing several sets of exercises every day.  Doing exercises for several weeks.  Making Kegel exercises a part of your  regular exercise routine. This information is not intended to replace advice given to you by your health care provider. Make sure you discuss any questions you have with your health care provider. Document Revised: 12/22/2017 Document Reviewed: 12/22/2017 Elsevier Patient Education  [place].   Fecal Incontinence Fecal incontinence, also called accidental bowel leakage, is not being able to control your bowels. This condition happens because the nerves or muscles around the anus do not work the way they should. This affects their ability to hold stool (feces). What are the causes? This condition may be caused by:  Damage to the muscles at the end of the rectum (sphincter).  Damage to the nerves that control bowel movements.  Diarrhea.  Chronic constipation.  Pelvic floor dysfunction. This means the muscles in the pelvis do not work well.  Loss of bowel storage capacity. This occurs when the rectum can no longer stretch in size in order to store feces.  Inflammatory bowel disease (IBD), such as Crohn's disease.  Irritable bowel syndrome (IBS). What increases the risk? You are more likely to develop this condition if you:  Were born with bowels or a pelvis that did not form correctly.  Have had rectal surgery.  Have had radiation treatment for certain cancers.  Have been pregnant, had a vaginal delivery, or had surgery that damaged the pelvic floor muscles.  Had a complicated childbirth, spinal cord injury, or other trauma that caused nerve damage.  Have a condition that can affect nerve function, such as diabetes, Parkinson's  disease, or multiple sclerosis.  Have a condition where the rectum drops down into the anus or vagina (prolapse).  Are 29 years of age or older. What are the signs or symptoms? The main symptom of this condition is not being able to control your bowels. You also might not be able to get to the bathroom before a bowel movement. How is this  diagnosed? This condition is diagnosed with a medical history and physical exam. You may also have other tests, including:  Blood tests.  Urine tests.  A rectal exam.  Ultrasound.  MRI.  Colonoscopy. This is an exam that looks at your large intestine (colon).  Anal manometry. This is a test that measures the strength of the anal sphincter.  Anal electromyogram (EMG). This is a test that uses small electrodes to check for nerve damage. How is this treated? Treatment for this condition depends on the cause and severity. Treatment may also focus on addressing any underlying causes of this condition. Treatment may include:  Medicines. This may include medicines to: ? Prevent diarrhea. ? Help with constipation (bulk-forming laxatives). ? Treat any underlying conditions.  Biofeedback therapy. This can help to retrain muscles that are affected.  Fiber supplements. These can help manage your bowel movements.  Nerve stimulation.  Injectable gel to promote tissue growth and better muscle control.  Surgery. You may need: ? Sphincter repair surgery. ? Diversion surgery. This procedure lets feces pass out of your body through a hole in your abdomen. Follow these instructions at home: Eating and drinking   Follow instructions from your health care provider about any eating or drinking restrictions. ? Work with a dietitian to come up with a healthy diet that will help you avoid the foods that can make your condition worse. ? Keep a diet diary to find out which foods or drinks could be making your condition worse.  Drink enough fluid to keep your urine pale yellow. Lifestyle  Do not use any products that contain nicotine or tobacco, such as cigarettes and e-cigarettes. If you need help quitting, ask your health care provider. This may help your condition.  If you are overweight, talk with your health care provider about how to safely lose weight. This may help your  condition.  Increase your physical activity as told by your health care provider. This may help your condition. Always talk with your health care provider before starting a new exercise program.  Carry a change of clothes and supplies to clean up quickly if you have an episode of fetal incontinence.  Consider joining a fecal incontinence support group. You can find a support group online or in your local community. General instructions   Take over-the-counter and prescription medicines only as told by your health care provider. This includes any supplements.  Apply a moisture barrier, such as petroleum jelly, to your rectum. This protects the skin from irritation caused by ongoing leaking or diarrhea.  Tell your health care provider if you are upset or depressed about your condition.  Keep all follow-up visits as told by your health care provider. This is important. Where to find more information  International Foundation for Functional Gastrointestinal Disorders: iffgd.[place] of Gastroenterology: patients.gi.org Contact a health care provider if:  You have a fever.  You have redness, swelling, or pain around your rectum.  Your pain is getting worse or you lose feeling in your rectal area.  You have blood in your stool.  You feel sad or hopeless.  You avoid social or work situations. Get help right away if:  You stop having bowel movements.  You cannot eat or drink without vomiting.  You have rectal bleeding that does not stop.  You have severe pain that is getting worse.  You have symptoms of dehydration, including: ? Sleepiness or fatigue. ? Producing little or no urine, tears, or sweat. ? Dizziness. ? Dry mouth. ? Unusual irritability. ? Headache. ? Inability to think clearly. Summary  Fecal incontinence, also called accidental bowel leakage, is not being able to control your bowels. This condition happens because the nerves or muscles around  the anus do not work the way they should.  Treatment varies depending on the cause and severity of your condition. Treatment may also focus on addressing any underlying causes of this condition.  Follow instructions from your health care provider about any eating or drinking restrictions, lifestyle changes, and skin care.  Take over-the-counter and prescription medicines only as told by your health care provider. This includes any supplements.  Tell your health care provider if your symptoms worsen or if you are upset or depressed about your condition. This information is not intended to replace advice given to you by your health care provider. Make sure you discuss any questions you have with your health care provider. Document Revised: 09/16/2017 Document Reviewed: 09/16/2017 Elsevier Patient Education  [place]. Urinary Incontinence  Urinary incontinence refers to a condition in which a person is unable to control where and when to pass urine. A person with this condition will urinate when he or she does not mean to (involuntarily). What are the causes? This condition may be caused by:  Medicines.  Infections.  Constipation.  Overactive bladder muscles.  Weak bladder muscles.  Weak pelvic floor muscles. These muscles provide support for the bladder, intestine, and, in women, the uterus.  Enlarged prostate in men. The prostate is a gland near the bladder. When it gets too big, it can pinch the urethra. With the urethra blocked, the bladder can weaken and lose the ability to empty properly.  Surgery.  Emotional factors, such as anxiety, stress, or post-traumatic stress disorder (PTSD).  Pelvic organ prolapse. This happens in women when organs shift out of place and into the vagina. This shift can prevent the bladder and urethra from working properly. What increases the risk? The following factors may make you more likely to develop this condition:  Older  age.  Obesity and physical inactivity.  Pregnancy and childbirth.  Menopause.  Diseases that affect the nerves or spinal cord (neurological diseases).  Long-term (chronic) coughing. This can increase pressure on the bladder and pelvic floor muscles. What are the signs or symptoms? Symptoms may vary depending on the type of urinary incontinence you have. They include:  A sudden urge to urinate, but passing urine involuntarily before you can get to a bathroom (urge incontinence).  Suddenly passing urine with any activity that forces urine to pass, such as coughing, laughing, exercise, or sneezing (stress incontinence).  Needing to urinate often, but urinating only a small amount, or constantly dribbling urine (overflow incontinence).  Urinating because you cannot get to the bathroom in time due to a physical disability, such as arthritis or injury, or communication and thinking problems, such as Alzheimer disease (functional incontinence). How is this diagnosed? This condition may be diagnosed based on:  Your medical history.  A physical exam.  Tests, such as: ? Urine tests. ? X-rays of your kidney and bladder. ? Ultrasound. ?  CT scan. ? Cystoscopy. In this procedure, a health care provider inserts a tube with a light and camera (cystoscope) through the urethra and into the bladder in order to check for problems. ? Urodynamic testing. These tests assess how well the bladder, urethra, and sphincter can store and release urine. There are different types of urodynamic tests, and they vary depending on what the test is measuring. To help diagnose your condition, your health care provider may recommend that you keep a log of when you urinate and how much you urinate. How is this treated? Treatment for this condition depends on the type of incontinence that you have and its cause. Treatment may include:  Lifestyle changes, such as: ? Quitting smoking. ? Maintaining a healthy  weight. ? Staying active. Try to get 150 minutes of moderate-intensity exercise every week. Ask your health care provider which activities are safe for you. ? Eating a healthy diet.  Avoid high-fat foods, like fried foods.  Avoid refined carbohydrates like white bread and white rice.  Limit how much alcohol and caffeine you drink.  Increase your fiber intake. Foods such as fresh fruits, vegetables, beans, and whole grains are healthy sources of fiber.  Pelvic floor muscle exercises.  Bladder training, such as lengthening the amount of time between bathroom breaks, or using the bathroom at regular intervals.  Using techniques to suppress bladder urges. This can include distraction techniques or controlled breathing exercises.  Medicines to relax the bladder muscles and prevent bladder spasms.  Medicines to help slow or prevent the growth of a man's prostate.  Botox injections. These can help relax the bladder muscles.  Using pulses of electricity to help change bladder reflexes (electrical nerve stimulation).  For women, using a medical device to prevent urine leaks. This is a small, tampon-like, disposable device that is inserted into the urethra.  Injecting collagen or carbon beads (bulking agents) into the urinary sphincter. These can help thicken tissue and close the bladder opening.  Surgery. Follow these instructions at home: Lifestyle  Limit alcohol and caffeine. These can fill your bladder quickly and irritate it.  Keep yourself clean to help prevent odors and skin damage. Ask your doctor about special skin creams and cleansers that can protect the skin from urine.  Consider wearing pads or adult diapers. Make sure to change them regularly, and always change them right after experiencing incontinence. General instructions  Take over-the-counter and prescription medicines only as told by your health care provider.  Use the bathroom about every 3-4 hours, even if you  do not feel the need to urinate. Try to empty your bladder completely every time. After urinating, wait a minute. Then try to urinate again.  Make sure you are in a relaxed position while urinating.  If your incontinence is caused by nerve problems, keep a log of the medicines you take and the times you go to the bathroom.  Keep all follow-up visits as told by your health care provider. This is important. Contact a health care provider if:  You have pain that gets worse.  Your incontinence gets worse. Get help right away if:  You have a fever or chills.  You are unable to urinate.  You have redness in your groin area or down your legs. Summary  Urinary incontinence refers to a condition in which a person is unable to control where and when to pass urine.  This condition may be caused by medicines, infection, weak bladder muscles, weak pelvic floor muscles, enlargement  of the prostate (in men), or surgery.  The following factors increase your risk for developing this condition: older age, obesity, pregnancy and childbirth, menopause, neurological diseases, and chronic coughing.  There are several types of urinary incontinence. They include urge incontinence, stress incontinence, overflow incontinence, and functional incontinence.  This condition is usually treated first with lifestyle and behavioral changes, such as quitting smoking, eating a healthier diet, and doing regular pelvic floor exercises. Other treatment options include medicines, bulking agents, medical devices, electrical nerve stimulation, or surgery. This information is not intended to replace advice given to you by your health care provider. Make sure you discuss any questions you have with your health care provider. Document Revised: 05/14/2017 Document Reviewed: 08/13/2016 Elsevier Patient Education  [place].

## 2020-03-09 LAB — URINALYSIS, MICROSCOPIC ONLY
Bacteria, UA: NONE SEEN
Casts: NONE SEEN /lpf
WBC, UA: NONE SEEN /hpf (ref 0–5)

## 2020-03-10 LAB — URINE CULTURE

## 2020-03-11 ENCOUNTER — Ambulatory Visit (HOSPITAL_BASED_OUTPATIENT_CLINIC_OR_DEPARTMENT_OTHER)
Admission: RE | Admit: 2020-03-11 | Discharge: 2020-03-11 | Disposition: A | Discharge status: Home / Self Care | Payer: Managed Care, Other (non HMO) | Source: Ambulatory Visit | Attending: Internal Medicine | Admitting: Internal Medicine

## 2020-03-11 ENCOUNTER — Encounter (HOSPITAL_BASED_OUTPATIENT_CLINIC_OR_DEPARTMENT_OTHER): Payer: Self-pay

## 2020-03-11 ENCOUNTER — Other Ambulatory Visit: Payer: Self-pay

## 2020-03-11 DIAGNOSIS — Z1231 Encounter for screening mammogram for malignant neoplasm of breast: Secondary | ICD-10-CM

## 2020-03-26 NOTE — Progress Notes (Signed)
64 y.o. G0P0000 Single Black or African American Not Hispanic or Latino female here for annual exam.  No vaginal bleeding. H/O [facility] and cervical stump prolapse. The prolapse isn't symptomatic.   She has a long h/o urge incontinence, nocturia and enuresis. She was started on ditropan 10 mg XL last month.  The worst part of her urinary incontinence is at night. As soon as she gets out of bed her bladder empties. Since starting the ditropan she is leaking one x a night as opposed to 2-3 x a night. Still wakes up wet. No side effects. During the day she goes to the bathroom more frequently and doesn't leak much during the day. If she leaks during the day it's small amounts. She has cut back on her caffeine.   Also has issues with fecal incontinence, occurs daily, small or large amount, liquid or formed. No change.   Since starting on the ditropan she reports mild, intermittent incontinence.   She was seen last month for her incontinence issues. She was instructed to decrease caffeine, start metamucil, was started on ditropan and referred to PT. Urine culture was negative.     No LMP recorded (lmp unknown). Patient has had a hysterectomy.          Sexually active: No.  The current method of family planning is status post hysterectomy.    Exercising: No.  The patient does not participate in regular exercise at present. Smoker:  no  Health Maintenance: Pap:11/6/20WNL   11/16/2016 WNL NEG HPV, 07-11-14 WNL NEG HR HPV History of abnormal Pap:  no MMG:  03/12/20 density B Bi-rads 1 neg  BMD:   Never  Colonoscopy: 12/19/19 polyps removed. Recommended repeat in 7 years  TDaP:  12/04/14  Gardasil: none    reports that she has never smoked. She has never used smokeless tobacco. She reports that she does not drink alcohol and does not use drugs. 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 Considering retiring next year. Family in [place].   Past Medical History:  Diagnosis Date  . Allergic rhinitis   . Allergy    . Arthritis   . Cataract    bilateral removed  . Depression   . GERD (gastroesophageal reflux disease)   . Glaucoma   . Hypertension   . Hypothyroidism   . Morbid obesity ([place])   . OSA (obstructive sleep apnea)    Dr Joya Gaskins  . Sleep apnea    wears cpap  . Thyroid disease   . Urinary incontinence     Past Surgical History:  Procedure Laterality Date  . abdominal supracervical hysterectomy with bilateral salpingo-oophorectomy     fibroid uterus  . ANKLE SURGERY  4/10   rt  . BREAST REDUCTION SURGERY    . CATARACT EXTRACTION    . COLONOSCOPY    . COLONOSCOPY WITH PROPOFOL N/A 12/19/2019   Procedure: COLONOSCOPY WITH PROPOFOL;  Surgeon: Mauri Pole, MD;  Location: [facility];  Service: Endoscopy;  Laterality: N/A;  . HERNIA REPAIR  [id number]   umbillical  . HYSTEROSCOPY N/A 07/25/2019   Procedure: HYSTEROSCOPY OF CERVIX WITH ULTRASOUND GUIDANCE/KS;  Surgeon: Salvadore Dom, MD;  Location: [facility];  Service: Gynecology;  Laterality: N/A;  . INSERTION OF MESH N/A 01/07/2016   Procedure: INSERTION OF MESH;  Surgeon: Coralie Keens, MD;  Location: [facility];  Service: General;  Laterality: N/A;  . OPERATIVE ULTRASOUND N/A 07/25/2019   Procedure: OPERATIVE ULTRASOUND;  Surgeon: Salvadore Dom, MD;  Location: [facility];  Service: Gynecology;  Laterality: N/A;  . POLYPECTOMY  12/19/2019   Procedure: POLYPECTOMY;  Surgeon: Mauri Pole, MD;  Location: [facility];  Service: Endoscopy;;  . REDUCTION MAMMAPLASTY    . VENTRAL HERNIA REPAIR N/A 01/07/2016   Procedure: VENTRAL HERNIA REPAIR;  Surgeon: Coralie Keens, MD;  Location: [facility];  Service: General;  Laterality: N/A;    Current Outpatient Medications  Medication Sig Dispense Refill  . amLODipine (NORVASC) 5 MG tablet TAKE 1 TABLET(5 MG) BY MOUTH DAILY 90 tablet 1  . cholecalciferol (VITAMIN D3) 25 MCG (1000 UNIT) tablet Take 1,000 Units by mouth daily.    . dorzolamide-timolol (COSOPT) 22.3-6.8 MG/ML ophthalmic  solution Place 1 drop into both eyes 2 (two) times daily.     [name] levothyroxine (SYNTHROID) 150 MCG tablet TAKE 1 TABLET(150 MCG) BY MOUTH DAILY BEFORE BREAKFAST 90 tablet 3  . losartan (COZAAR) 100 MG tablet TAKE 1 TABLET(100 MG) BY MOUTH DAILY (Patient taking differently: Take 100 mg by mouth daily. ) 90 tablet 3  . oxybutynin (DITROPAN-XL) 10 MG 24 hr tablet Take 1 tablet (10 mg total) by mouth at bedtime. 30 tablet 1  . sertraline (ZOLOFT) 100 MG tablet TAKE 1 TABLET(100 MG) BY MOUTH DAILY (Patient taking differently: Take 100 mg by mouth daily. ) 90 tablet 3  . vitamin B-12 (CYANOCOBALAMIN) 500 MCG tablet Take 500 mcg by mouth daily.     No current facility-administered medications for this visit.    Family History  Problem Relation Age of Onset  . Breast cancer Mother   . Cancer Mother        breast  . Hypertension Mother   . Colon cancer Maternal Grandmother   . Cancer Maternal Grandmother        colon    Review of Systems  All other systems reviewed and are negative.   Exam:   BP 132/64   Pulse 68   Ht 5' 5.25" (1.657 m)   Wt (!) 326 lb 3.2 oz (148 kg)   LMP  (LMP Unknown)   SpO2 95%   BMI 53.87 kg/m   Weight change: @WEIGHTCHANGE @ Height:   Height: 5' 5.25" (165.7 cm)  Ht Readings from Last 3 Encounters:  03/28/20 5' 5.25" (1.657 m)  03/08/20 5\' 6"  (1.676 m)  12/19/19 5\' 6"  (1.676 m)    General appearance: alert, cooperative and appears stated age Head: Normocephalic, without obvious abnormality, atraumatic Neck: no adenopathy, supple, symmetrical, trachea midline and thyroid normal to inspection and palpation Lungs: clear to auscultation bilaterally Cardiovascular: regular rate and rhythm Breasts: normal appearance, no masses or tenderness, evidence of bilateral breast reduction Abdomen: soft, non-tender; non distended,  no masses,  no organomegaly Extremities: extremities normal, atraumatic, no cyanosis or edema Skin: Skin color, texture, turgor normal. No  rashes or lesions Lymph nodes: Cervical, supraclavicular, and axillary nodes normal. No abnormal inguinal nodes palpated Neurologic: Grossly normal   Pelvic: External genitalia:  no lesions              Urethra:  normal appearing urethra with no masses, tenderness or lesions              Bartholins and Skenes: normal                 Vagina: normal appearing vagina with normal color and discharge, no lesions              Cervix: no lesions and grade 2 cervical stump prolapse  Bimanual Exam:  Uterus:  uterus absent              Adnexa: no mass, fullness, tenderness               Rectovaginal: Confirms               Anus:  normal sphincter tone, no lesions. Normal resting tone.   Kegel strength 0/5  Shanon Petty chaperoned for the exam.  A:  Well Woman with normal exam  Urge incontinence, enuresis  Fecal incontinence, no kegel strength  Cervical stump prolapse  P:   No pap this year  Mammogram and colonoscopy are UTD  Labs with primary  Discussed breast self exam  Discussed calcium and vit D intake  Recommended she start metamucil to bulk her stool  Patient never heard from PT, will work on referral  Will increase her ditropan to 15 mg   Referral to Urology placed  DEXA ordered

## 2020-03-28 ENCOUNTER — Telehealth: Payer: Self-pay

## 2020-03-28 ENCOUNTER — Other Ambulatory Visit: Payer: Self-pay

## 2020-03-28 ENCOUNTER — Ambulatory Visit (INDEPENDENT_AMBULATORY_CARE_PROVIDER_SITE_OTHER): Payer: Managed Care, Other (non HMO) | Admitting: Obstetrics and Gynecology

## 2020-03-28 ENCOUNTER — Encounter: Payer: Self-pay | Admitting: Obstetrics and Gynecology

## 2020-03-28 VITALS — BP 132/64 | HR 68 | Ht 65.25 in | Wt 326.2 lb

## 2020-03-28 DIAGNOSIS — Z780 Asymptomatic menopausal state: Secondary | ICD-10-CM | POA: Diagnosis not present

## 2020-03-28 DIAGNOSIS — N8185 Cervical stump prolapse: Secondary | ICD-10-CM | POA: Diagnosis not present

## 2020-03-28 DIAGNOSIS — E2839 Other primary ovarian failure: Secondary | ICD-10-CM | POA: Diagnosis not present

## 2020-03-28 DIAGNOSIS — R159 Full incontinence of feces: Secondary | ICD-10-CM

## 2020-03-28 DIAGNOSIS — R152 Fecal urgency: Secondary | ICD-10-CM | POA: Diagnosis not present

## 2020-03-28 DIAGNOSIS — Z01419 Encounter for gynecological examination (general) (routine) without abnormal findings: Secondary | ICD-10-CM

## 2020-03-28 DIAGNOSIS — R32 Unspecified urinary incontinence: Secondary | ICD-10-CM

## 2020-03-28 MED ORDER — OXYBUTYNIN CHLORIDE ER 15 MG PO TB24: 15.0000 mg | ORAL_TABLET | Freq: Every day | ORAL | 3 refills | Status: DC

## 2020-03-28 NOTE — Patient Instructions (Addendum)
Start Metamucil for fecal incontinence.   Kegel Exercises  Kegel exercises can help strengthen your pelvic floor muscles. The pelvic floor is a group of muscles that support your rectum, small intestine, and bladder. In females, pelvic floor muscles also help support the womb (uterus). These muscles help you control the flow of urine and stool. Kegel exercises are painless and simple, and they do not require any equipment. Your provider may suggest Kegel exercises to:  Improve bladder and bowel control.  Improve sexual response.  Improve weak pelvic floor muscles after surgery to remove the uterus (hysterectomy) or pregnancy (females).  Improve weak pelvic floor muscles after prostate gland removal or surgery (males). Kegel exercises involve squeezing your pelvic floor muscles, which are the same muscles you squeeze when you try to stop the flow of urine or keep from passing gas. The exercises can be done while sitting, standing, or lying down, but it is best to vary your position. Exercises How to do Kegel exercises: 1. Squeeze your pelvic floor muscles tight. You should feel a tight lift in your rectal area. If you are a female, you should also feel a tightness in your vaginal area. Keep your stomach, buttocks, and legs relaxed. 2. Hold the muscles tight for up to 10 seconds. 3. Breathe normally. 4. Relax your muscles. 5. Repeat as told by your health care provider. Repeat this exercise daily as told by your health care provider. Continue to do this exercise for at least 4-6 weeks, or for as long as told by your health care provider. You may be referred to a physical therapist who can help you learn more about how to do Kegel exercises. Depending on your condition, your health care provider may recommend:  Varying how long you squeeze your muscles.  Doing several sets of exercises every day.  Doing exercises for several weeks.  Making Kegel exercises a part of your regular exercise  routine. This information is not intended to replace advice given to you by your health care provider. Make sure you discuss any questions you have with your health care provider. Document Revised: 12/22/2017 Document Reviewed: 12/22/2017 Elsevier Patient Education  [place] DIET:  We recommended that you start or continue a regular exercise program for good health. Regular exercise means any activity that makes your heart beat faster and makes you sweat.  We recommend exercising at least 30 minutes per day at least 3 days a week, preferably 4 or 5.  We also recommend a diet low in fat and sugar.  Inactivity, poor dietary choices and obesity can cause diabetes, heart attack, stroke, and kidney damage, among others.    ALCOHOL AND SMOKING:  Women should limit their alcohol intake to no more than 7 drinks/beers/glasses of wine (combined, not each!) per week. Moderation of alcohol intake to this level decreases your risk of breast cancer and liver damage. And of course, no recreational drugs are part of a healthy lifestyle.  And absolutely no smoking or even second hand smoke. Most people know smoking can cause heart and lung diseases, but did you know it also contributes to weakening of your bones? Aging of your skin?  Yellowing of your teeth and nails?  CALCIUM AND VITAMIN D:  Adequate intake of calcium and Vitamin D are recommended.  The recommendations for exact amounts of these supplements seem to change often, but generally speaking 1,200 mg of calcium (between diet and supplement) and 800 units of Vitamin D per  day seems prudent. Certain women may benefit from higher intake of Vitamin D.  If you are among these women, your doctor will have told you during your visit.    PAP SMEARS:  Pap smears, to check for cervical cancer or precancers,  have traditionally been done yearly, although recent scientific advances have shown that most women can have pap smears less often.   However, every woman still should have a physical exam from her gynecologist every year. It will include a breast check, inspection of the vulva and vagina to check for abnormal growths or skin changes, a visual exam of the cervix, and then an exam to evaluate the size and shape of the uterus and ovaries.  And after 65 years of age, a rectal exam is indicated to check for rectal cancers. We will also provide age appropriate advice regarding health maintenance, like when you should have certain vaccines, screening for sexually transmitted diseases, bone density testing, colonoscopy, mammograms, etc.   MAMMOGRAMS:  All women over 64 years old should have a yearly mammogram. Many facilities now offer a "3D" mammogram, which may cost around $50 extra out of pocket. If possible,  we recommend you accept the option to have the 3D mammogram performed.  It both reduces the number of women who will be called back for extra views which then turn out to be normal, and it is better than the routine mammogram at detecting truly abnormal areas.    COLON CANCER SCREENING: Now recommend starting at age 63. At this time colonoscopy is not covered for routine screening until 50. There are take home tests that can be done between 45-49.   COLONOSCOPY:  Colonoscopy to screen for colon cancer is recommended for all women at age 34.  We know, you hate the idea of the prep.  We agree, BUT, having colon cancer and not knowing it is worse!!  Colon cancer so often starts as a polyp that can be seen and removed at colonscopy, which can quite literally save your life!  And if your first colonoscopy is normal and you have no family history of colon cancer, most women don't have to have it again for 10 years.  Once every ten years, you can do something that may end up saving your life, right?  We will be happy to help you get it scheduled when you are ready.  Be sure to check your insurance coverage so you understand how much it will cost.   It may be covered as a preventative service at no cost, but you should check your particular policy.      Breast Self-Awareness Breast self-awareness means being familiar with how your breasts look and feel. It involves checking your breasts regularly and reporting any changes to your health care provider. Practicing breast self-awareness is important. A change in your breasts can be a sign of a serious medical problem. Being familiar with how your breasts look and feel allows you to find any problems early, when treatment is more likely to be successful. All women should practice breast self-awareness, including women who have had breast implants. How to do a breast self-exam One way to learn what is normal for your breasts and whether your breasts are changing is to do a breast self-exam. To do a breast self-exam: Look for Changes  1. Remove all the clothing above your waist. 2. Stand in front of a mirror in a room with good lighting. 3. Put your hands on your hips.  4. Push your hands firmly downward. 5. Compare your breasts in the mirror. Look for differences between them (asymmetry), such as: ? Differences in shape. ? Differences in size. ? Puckers, dips, and bumps in one breast and not the other. 6. Look at each breast for changes in your skin, such as: ? Redness. ? Scaly areas. 7. Look for changes in your nipples, such as: ? Discharge. ? Bleeding. ? Dimpling. ? Redness. ? A change in position. Feel for Changes Carefully feel your breasts for lumps and changes. It is best to do this while lying on your back on the floor and again while sitting or standing in the shower or tub with soapy water on your skin. Feel each breast in the following way:  Place the arm on the side of the breast you are examining above your head.  Feel your breast with the other hand.  Start in the nipple area and make  inch (2 cm) overlapping circles to feel your breast. Use the pads of your three  middle fingers to do this. Apply light pressure, then medium pressure, then firm pressure. The light pressure will allow you to feel the tissue closest to the skin. The medium pressure will allow you to feel the tissue that is a little deeper. The firm pressure will allow you to feel the tissue close to the ribs.  Continue the overlapping circles, moving downward over the breast until you feel your ribs below your breast.  Move one finger-width toward the center of the body. Continue to use the  inch (2 cm) overlapping circles to feel your breast as you move slowly up toward your collarbone.  Continue the up and down exam using all three pressures until you reach your armpit.  Write Down What You Find  Write down what is normal for each breast and any changes that you find. Keep a written record with breast changes or normal findings for each breast. By writing this information down, you do not need to depend only on memory for size, tenderness, or location. Write down where you are in your menstrual cycle, if you are still menstruating. If you are having trouble noticing differences in your breasts, do not get discouraged. With time you will become more familiar with the variations in your breasts and more comfortable with the exam. How often should I examine my breasts? Examine your breasts every month. If you are breastfeeding, the best time to examine your breasts is after a feeding or after using a breast pump. If you menstruate, the best time to examine your breasts is 5-7 days after your period is over. During your period, your breasts are lumpier, and it may be more difficult to notice changes. When should I see my health care provider? See your health care provider if you notice:  A change in shape or size of your breasts or nipples.  A change in the skin of your breast or nipples, such as a reddened or scaly area.  Unusual discharge from your nipples.  A lump or thick area that was  not there before.  Pain in your breasts.  Anything that concerns you.

## 2020-03-28 NOTE — Telephone Encounter (Signed)
-----   Message from Salvadore Dom, MD sent at 03/28/2020  8:16 AM EST ----- I put in a referral last month for this patient to go to PT. She hasn't heard anything. Please look into it.

## 2020-03-28 NOTE — Telephone Encounter (Signed)
Routing to [facility] for referral update.

## 2020-04-01 NOTE — Telephone Encounter (Signed)
At the time of her annual exam on 03/28/20 the patient hadn't heard from PT. I also placed a referral to Urology on 03/28/20, she needs both.

## 2020-04-01 NOTE — Telephone Encounter (Signed)
Per rep at [facility], patient has not been scheduled to date.

## 2020-04-01 NOTE — Telephone Encounter (Signed)
Left message for pt to return call to triage RN. 

## 2020-04-02 NOTE — Telephone Encounter (Signed)
Pt has both PT and Urology referrals placed.   Routing to [facility] for update.

## 2020-04-03 NOTE — Telephone Encounter (Signed)
Pt returned call. Spoke with pt. Pt states has not had any call from [facility] for referral appts. Pt advised I will call and schedule appts. Pt agreeable.   Call placed to [facility]. Spoke with triage RN.  States will look at schedule and return call.

## 2020-04-03 NOTE — Telephone Encounter (Signed)
Call and left detailed message with pt. Ok per pt and DPR.  Pt scheduled with Dr Matilde Sprang on 12/7at 930 am. Per Jenny Reichmann, RN at [facility], Pt will see Dr Matilde Sprang first and then will be set up for Pelvic PT.  Pt to return call with any questions or concerns.  Routing to Dr Talbert Nan for update  Cc: Hayley for referral update  Encounter closed

## 2020-04-08 ENCOUNTER — Other Ambulatory Visit: Payer: Self-pay

## 2020-04-08 ENCOUNTER — Encounter (HOSPITAL_COMMUNITY): Payer: Self-pay

## 2020-04-08 ENCOUNTER — Ambulatory Visit (HOSPITAL_COMMUNITY)
Admission: RE | Admit: 2020-04-08 | Discharge: 2020-04-08 | Disposition: A | Discharge status: Home / Self Care | Payer: Managed Care, Other (non HMO) | Source: Ambulatory Visit | Attending: Obstetrics and Gynecology | Admitting: Obstetrics and Gynecology

## 2020-04-08 ENCOUNTER — Other Ambulatory Visit (HOSPITAL_BASED_OUTPATIENT_CLINIC_OR_DEPARTMENT_OTHER): Payer: Managed Care, Other (non HMO)

## 2020-04-08 ENCOUNTER — Ambulatory Visit (HOSPITAL_BASED_OUTPATIENT_CLINIC_OR_DEPARTMENT_OTHER): Admission: RE | Admit: 2020-04-08 | Payer: Managed Care, Other (non HMO) | Source: Ambulatory Visit

## 2020-04-08 ENCOUNTER — Ambulatory Visit (HOSPITAL_BASED_OUTPATIENT_CLINIC_OR_DEPARTMENT_OTHER)
Admission: RE | Admit: 2020-04-08 | Discharge: 2020-04-08 | Disposition: A | Discharge status: Home / Self Care | Payer: Managed Care, Other (non HMO) | Source: Ambulatory Visit | Attending: Obstetrics and Gynecology | Admitting: Obstetrics and Gynecology

## 2020-04-08 DIAGNOSIS — E2839 Other primary ovarian failure: Secondary | ICD-10-CM | POA: Diagnosis present

## 2020-04-08 DIAGNOSIS — Z780 Asymptomatic menopausal state: Secondary | ICD-10-CM | POA: Insufficient documentation

## 2020-04-09 ENCOUNTER — Ambulatory Visit (INDEPENDENT_AMBULATORY_CARE_PROVIDER_SITE_OTHER): Payer: Managed Care, Other (non HMO) | Admitting: Internal Medicine

## 2020-04-09 ENCOUNTER — Encounter: Payer: Self-pay | Admitting: Internal Medicine

## 2020-04-09 DIAGNOSIS — I10 Essential (primary) hypertension: Secondary | ICD-10-CM | POA: Diagnosis not present

## 2020-04-09 DIAGNOSIS — E559 Vitamin D deficiency, unspecified: Secondary | ICD-10-CM | POA: Diagnosis not present

## 2020-04-09 DIAGNOSIS — F3341 Major depressive disorder, recurrent, in partial remission: Secondary | ICD-10-CM

## 2020-04-09 DIAGNOSIS — E034 Atrophy of thyroid (acquired): Secondary | ICD-10-CM

## 2020-04-09 DIAGNOSIS — K219 Gastro-esophageal reflux disease without esophagitis: Secondary | ICD-10-CM

## 2020-04-09 LAB — URINALYSIS, ROUTINE W REFLEX MICROSCOPIC
Bilirubin Urine: NEGATIVE
Ketones, ur: NEGATIVE
Leukocytes,Ua: NEGATIVE
Nitrite: NEGATIVE
RBC / HPF: NONE SEEN (ref 0–?)
Specific Gravity, Urine: >=1.03 — AB (ref 1.000–1.030)
Total Protein, Urine: NEGATIVE
Urine Glucose: NEGATIVE
Urobilinogen, UA: 0.2 (ref 0.0–1.0)
pH: 6 (ref 5.0–8.0)

## 2020-04-09 LAB — COMPREHENSIVE METABOLIC PANEL
ALT: 29 U/L (ref 0–35)
AST: 34 U/L (ref 0–37)
Albumin: 3.9 g/dL (ref 3.5–5.2)
Alkaline Phosphatase: 74 U/L (ref 39–117)
BUN: 12 mg/dL (ref 6–23)
CO2: 28 mEq/L (ref 19–32)
Calcium: 9.6 mg/dL (ref 8.4–10.5)
Chloride: 106 mEq/L (ref 96–112)
Creatinine, Ser: 0.78 mg/dL (ref 0.40–1.20)
GFR: 79.8 mL/min (ref >60.00)
Glucose, Bld: 125 mg/dL — ABNORMAL HIGH (ref 70–99)
Potassium: 3.4 mEq/L — ABNORMAL LOW (ref 3.5–5.1)
Sodium: 142 mEq/L (ref 135–145)
Total Bilirubin: 0.7 mg/dL (ref 0.2–1.2)
Total Protein: 7.4 g/dL (ref 6.0–8.3)

## 2020-04-09 LAB — CBC WITH DIFFERENTIAL/PLATELET
Basophils Absolute: 0 10*3/uL (ref 0.0–0.1)
Basophils Relative: 0.4 % (ref 0.0–3.0)
Eosinophils Absolute: 0.3 10*3/uL (ref 0.0–0.7)
Eosinophils Relative: 3.8 % (ref 0.0–5.0)
HCT: 39.8 % (ref 36.0–46.0)
Hemoglobin: 12.9 g/dL (ref 12.0–15.0)
Lymphocytes Relative: 29 % (ref 12.0–46.0)
Lymphs Abs: 2 10*3/uL (ref 0.7–4.0)
MCHC: 32.4 g/dL (ref 30.0–36.0)
MCV: 91.2 fl (ref 78.0–100.0)
Monocytes Absolute: 0.5 10*3/uL (ref 0.1–1.0)
Monocytes Relative: 6.5 % (ref 3.0–12.0)
Neutro Abs: 4.2 10*3/uL (ref 1.4–7.7)
Neutrophils Relative %: 60.3 % (ref 43.0–77.0)
Platelets: 237 10*3/uL (ref 150.0–400.0)
RBC: 4.36 Mil/uL (ref 3.87–5.11)
RDW: 14 % (ref 11.5–15.5)
WBC: 6.9 10*3/uL (ref 4.0–10.5)

## 2020-04-09 LAB — LIPID PANEL
Cholesterol: 188 mg/dL (ref 0–200)
HDL: 43.6 mg/dL (ref >39.00)
LDL Cholesterol: 121 mg/dL — ABNORMAL HIGH (ref 0–99)
NonHDL: 144.59
Total CHOL/HDL Ratio: 4
Triglycerides: 117 mg/dL (ref 0.0–149.0)
VLDL: 23.4 mg/dL (ref 0.0–40.0)

## 2020-04-09 LAB — T4, FREE: Free T4: 0.65 ng/dL (ref 0.60–1.60)

## 2020-04-09 LAB — TSH: TSH: 6.39 u[IU]/mL — ABNORMAL HIGH (ref 0.35–4.50)

## 2020-04-09 LAB — VITAMIN D 25 HYDROXY (VIT D DEFICIENCY, FRACTURES): VITD: 23.2 ng/mL — ABNORMAL LOW (ref 30.00–100.00)

## 2020-04-09 MED ORDER — FAMOTIDINE 40 MG PO TABS: 40.0000 mg | ORAL_TABLET | Freq: Every day | ORAL | 3 refills | Status: DC

## 2020-04-09 NOTE — Assessment & Plan Note (Signed)
On Levoxyl 

## 2020-04-09 NOTE — Progress Notes (Signed)
Subjective:  Patient ID: Tracy Sampson, female    DOB: 21-Jul-1955  Age: 64 y.o. MRN: [id number]  CC: Follow-up   HPI Tracy Sampson presents for HTN, hypothyroidism, OAB f/u C/o GERD  Outpatient Medications Prior to Visit  Medication Sig Dispense Refill  . amLODipine (NORVASC) 5 MG tablet TAKE 1 TABLET(5 MG) BY MOUTH DAILY 90 tablet 1  . cholecalciferol (VITAMIN D3) 25 MCG (1000 UNIT) tablet Take 1,000 Units by mouth daily.    . dorzolamide-timolol (COSOPT) 22.3-6.8 MG/ML ophthalmic solution Place 1 drop into both eyes 2 (two) times daily.     [name] levothyroxine (SYNTHROID) 150 MCG tablet TAKE 1 TABLET(150 MCG) BY MOUTH DAILY BEFORE BREAKFAST 90 tablet 3  . losartan (COZAAR) 100 MG tablet TAKE 1 TABLET(100 MG) BY MOUTH DAILY (Patient taking differently: Take 100 mg by mouth daily. ) 90 tablet 3  . oxybutynin (DITROPAN XL) 15 MG 24 hr tablet Take 1 tablet (15 mg total) by mouth at bedtime. 90 tablet 3  . sertraline (ZOLOFT) 100 MG tablet TAKE 1 TABLET(100 MG) BY MOUTH DAILY (Patient taking differently: Take 100 mg by mouth daily. ) 90 tablet 3  . vitamin B-12 (CYANOCOBALAMIN) 500 MCG tablet Take 500 mcg by mouth daily.    [name] oxybutynin (DITROPAN-XL) 10 MG 24 hr tablet Take 10 mg by mouth daily.     No facility-administered medications prior to visit.    ROS: Review of Systems  Constitutional: Negative for activity change, appetite change, chills, fatigue and unexpected weight change.  HENT: Negative for congestion, mouth sores and sinus pressure.   Eyes: Negative for visual disturbance.  Respiratory: Negative for cough and chest tightness.   Gastrointestinal: Negative for abdominal pain and nausea.  Genitourinary: Negative for difficulty urinating, frequency and vaginal pain.  Musculoskeletal: Negative for back pain and gait problem.  Skin: Negative for pallor and rash.  Neurological: Negative for dizziness, tremors, weakness, numbness and headaches.  Psychiatric/Behavioral:  Negative for confusion, sleep disturbance and suicidal ideas. The patient is nervous/anxious.     Objective:  BP 138/84 (BP Location: Left Arm)   Pulse 67   Temp 98.8 F (37.1 C) (Oral)   Wt (!) 329 lb (149.2 kg)   LMP  (LMP Unknown)   SpO2 96%   BMI 54.33 kg/m   BP Readings from Last 3 Encounters:  04/09/20 138/84  03/28/20 132/64  03/08/20 128/80    Wt Readings from Last 3 Encounters:  04/09/20 (!) 329 lb (149.2 kg)  03/28/20 (!) 326 lb 3.2 oz (148 kg)  03/08/20 (!) 326 lb (147.9 kg)    Physical Exam Constitutional:      General: She is not in acute distress.    Appearance: She is well-developed. She is obese.  HENT:     Head: Normocephalic.     Right Ear: External ear normal.     Left Ear: External ear normal.     Nose: Nose normal.  Eyes:     General:        Right eye: No discharge.        Left eye: No discharge.     Conjunctiva/sclera: Conjunctivae normal.     Pupils: Pupils are equal, round, and reactive to light.  Neck:     Thyroid: No thyromegaly.     Vascular: No JVD.     Trachea: No tracheal deviation.  Cardiovascular:     Rate and Rhythm: Normal rate and regular rhythm.     Heart sounds: Normal heart sounds.  Pulmonary:     Effort: No respiratory distress.     Breath sounds: No stridor. No wheezing.  Abdominal:     General: Bowel sounds are normal. There is no distension.     Palpations: Abdomen is soft. There is no mass.     Tenderness: There is no abdominal tenderness. There is no guarding or rebound.  Musculoskeletal:        General: No tenderness.     Cervical back: Normal range of motion and neck supple.  Lymphadenopathy:     Cervical: No cervical adenopathy.  Skin:    Findings: No erythema or rash.  Neurological:     Cranial Nerves: No cranial nerve deficit.     Motor: No abnormal muscle tone.     Coordination: Coordination normal.     Deep Tendon Reflexes: Reflexes normal.  Psychiatric:        Behavior: Behavior normal.         Thought Content: Thought content normal.        Judgment: Judgment normal.     Lab Results  Component Value Date   WBC 7.6 09/05/2019   HGB 12.9 09/05/2019   HCT 40.6 09/05/2019   PLT 265.0 09/05/2019   GLUCOSE 147 (H) 09/05/2019   CHOL 191 10/27/2018   TRIG 109.0 10/27/2018   HDL 40.70 10/27/2018   LDLDIRECT 149.3 10/23/2010   LDLCALC 128 (H) 10/27/2018   ALT 21 10/27/2018   AST 19 10/27/2018   NA 143 09/05/2019   K 4.2 09/05/2019   CL 106 09/05/2019   CREATININE 0.79 09/05/2019   BUN 12 09/05/2019   CO2 28 09/05/2019   TSH 2.32 05/08/2019   INR 1.1 (H) 09/05/2019   HGBA1C 5.7 05/09/2018    DG Bone Density  Result Date: 04/08/2020 EXAM: DUAL X-RAY ABSORPTIOMETRY (DXA) FOR BONE MINERAL DENSITY IMPRESSION: Tracy Sampson Your patient Tracy Sampson completed a BMD test on 04/08/2020 using the [place] (analysis version: 16.SP2) manufactured by [employer]. The following summarizes the results of our evaluation. [place] PATIENT: Name: Tracy Sampson Patient ID: [id number] Birth Date: April 08, 1956 Height: 65.5 in. Gender: Female Measured: 04/08/2020 Weight: 326.0 lbs. Indications: African-American, Estrogen Deficiency, Hysterectomy, Low Calcium Intake, Oophorectomy ( Bilateral), Post Menopausal Fractures: Foot Treatments: Levothyroxine, Vitamin D ASSESSMENT: The BMD measured at Femur Neck Left is 0.955 g/cm2 with a T-score of -0.6. This patient is considered NORMAL according to [facility]) criteria. L1 & L4 were excluded due to degenerative changes. The scan quality is good. Site Region Measured Date Measured Age WHO YA BMD Classification T-score AP Spine L2-L3 04/08/2020 65.2 Normal 1.6 1.386 g/cm2 DualFemur Neck Right 04/08/2020 65.2 years Normal -0.5 0.965 g/cm2 World Health Organization [facility]) criteria for post-menopausal, Caucasian Women: Normal        T-score at or above -1 SD Low Bone Mass T-score between -1 and -2.5 SD Osteoporosis  T-score  at or below -2.5 SD RECOMMENDATION: 1. All patients should optimize calcium and vitamin D intake. 2. Consider FDA-approved medical therapies in postmenopausal women and men aged 21 years and older, based on the following: a. A hip or vertebral(clinical or morphometric) fracture. b. T-score < -2.5 at the femoral neck or spine after appropriate evaluation to exclude secondary causes c. Low bone mass (T-score between -1.0 and -2.5 at the femoral neck or spine) and a 10 year probability of a hip fracture >3% or a 10 year probability of major osteoporosis-related fracture > 20% based on the [place]-adapted  WHO algorithm d. Clinical judgement and/or patient preferences may indicate treatment for people with 10-year fracture probabilities above or below these levels FOLLOW-UP: Patients with diagnosis of osteoporosis or at high risk for fracture should have regular bone mineral density tests. For patients eligible for Medicare, routine testing is allowed once every 2 years. The testing frequency can be increased to one year for patients who have rapidly progressing disease, those who are receiving or discontinuing medical therapy to restore bone mass, or have additional risk factors. I have reviewed this report and agree with the above findings. Mark A. Thornton Papas, M.D. [facility] Electronically Signed   By: Lavonia Dana M.D.   On: 04/08/2020 14:47    Assessment & Plan:   There are no diagnoses linked to this encounter.   No orders of the defined types were placed in this encounter.    Follow-up: No follow-ups on file.  Walker Kehr, MD

## 2020-04-09 NOTE — Assessment & Plan Note (Signed)
On Vit D 

## 2020-04-09 NOTE — Assessment & Plan Note (Signed)
Worse. Start Pepcid 40

## 2020-04-09 NOTE — Assessment & Plan Note (Signed)
Chronic On Amlodipine, Losartan

## 2020-04-09 NOTE — Assessment & Plan Note (Signed)
On Zoloft. °

## 2020-04-09 NOTE — Assessment & Plan Note (Signed)
Pt states wt stays the same - 326 lbs

## 2020-04-09 NOTE — Addendum Note (Signed)
Addended by: Trenda Moots on: 34/62/1947 08:29 AM   Modules accepted: Orders

## 2020-04-15 ENCOUNTER — Telehealth: Payer: Self-pay

## 2020-04-15 NOTE — Telephone Encounter (Signed)
Patient would like to discuss bone density results.

## 2020-04-15 NOTE — Telephone Encounter (Signed)
Spoke with pt. Pt given results and recommendations per Dr Talbert Nan on BMD results. Pt agreeable and verbalized understanding.  Encounter closed    Salvadore Dom, MD  04/09/2020 7:15 AM EST     Please inform the patient that her dexa is normal. She should have a f/u DEXA in 10-15 years.

## 2020-04-17 ENCOUNTER — Other Ambulatory Visit: Payer: Self-pay | Admitting: Internal Medicine

## 2020-04-17 MED ORDER — VITAMIN D3 1.25 MG (50000 UT) PO CAPS: 1.0000 | ORAL_CAPSULE | ORAL | 0 refills | Status: DC

## 2020-04-17 MED ORDER — POTASSIUM CHLORIDE ER 10 MEQ PO TBCR: 10.0000 meq | EXTENDED_RELEASE_TABLET | Freq: Every day | ORAL | 3 refills | Status: DC

## 2020-04-17 MED ORDER — VITAMIN D3 50 MCG (2000 UT) PO CAPS: 2000.0000 [IU] | ORAL_CAPSULE | Freq: Every day | ORAL | 3 refills | Status: DC

## 2020-04-23 ENCOUNTER — Ambulatory Visit (HOSPITAL_BASED_OUTPATIENT_CLINIC_OR_DEPARTMENT_OTHER): Payer: Managed Care, Other (non HMO)

## 2020-05-10 ENCOUNTER — Other Ambulatory Visit: Payer: Self-pay | Admitting: Obstetrics and Gynecology

## 2020-06-18 ENCOUNTER — Other Ambulatory Visit: Payer: Self-pay | Admitting: Internal Medicine

## 2020-06-28 ENCOUNTER — Other Ambulatory Visit: Payer: Self-pay | Admitting: *Deleted

## 2020-06-28 MED ORDER — FAMOTIDINE 40 MG PO TABS
40.0000 mg | ORAL_TABLET | Freq: Every day | ORAL | 2 refills | Status: DC
Start: 2020-06-28 — End: 2022-09-23

## 2020-06-28 MED ORDER — SERTRALINE HCL 100 MG PO TABS
ORAL_TABLET | ORAL | 2 refills | Status: DC
Start: 2020-06-28 — End: 2020-10-02

## 2020-06-28 MED ORDER — LOSARTAN POTASSIUM 100 MG PO TABS
ORAL_TABLET | ORAL | 2 refills | Status: DC
Start: 2020-06-28 — End: 2021-11-06

## 2020-06-28 MED ORDER — AMLODIPINE BESYLATE 5 MG PO TABS
ORAL_TABLET | ORAL | 2 refills | Status: DC
Start: 2020-06-28 — End: 2020-07-15

## 2020-06-28 MED ORDER — POTASSIUM CHLORIDE ER 10 MEQ PO TBCR
10.0000 meq | EXTENDED_RELEASE_TABLET | Freq: Every day | ORAL | 2 refills | Status: DC
Start: 2020-06-28 — End: 2020-09-04

## 2020-07-02 ENCOUNTER — Telehealth: Payer: Self-pay | Admitting: Internal Medicine

## 2020-07-02 NOTE — Telephone Encounter (Signed)
  Ref# [id number] [facility] calling states they talked to the patient about the amLODipine (NORVASC) 5 MG tablet and she told them she had a sensitivity to it so they were just making sure they should still fill the prescription Call - [phone] option two

## 2020-07-03 NOTE — Telephone Encounter (Signed)
Which was Tribenzor.  Okay to try amlodipine.  Discontinue if problems.  Thanks

## 2020-07-03 NOTE — Telephone Encounter (Signed)
Called [facility] back spoke w/pharmacist gave MD response.Marland KitchenJohny Chess

## 2020-07-12 ENCOUNTER — Telehealth: Payer: Self-pay | Admitting: Internal Medicine

## 2020-07-12 MED ORDER — LEVOTHYROXINE SODIUM 150 MCG PO TABS
ORAL_TABLET | ORAL | 2 refills | Status: DC
Start: 2020-07-12 — End: 2020-10-02

## 2020-07-12 NOTE — Telephone Encounter (Signed)
1.Medication Requested: levothyroxine (SYNTHROID) 150 MCG tablet    2. Pharmacy (Name, Street, [place]): [place] [place] [facility]  3. On Med List: yes   4. Last Visit with PCP: 11.23.21  5. Next visit date with PCP: n/a    Agent: Please be advised that RX refills may take up to 3 business days. We ask that you follow-up with your pharmacy.

## 2020-07-12 NOTE — Telephone Encounter (Signed)
the patient is up to date refill sent to requested pharmacy.Marland KitchenJohny Sampson

## 2020-07-15 ENCOUNTER — Other Ambulatory Visit: Payer: Self-pay | Admitting: Internal Medicine

## 2020-08-22 ENCOUNTER — Ambulatory Visit: Payer: Managed Care, Other (non HMO) | Admitting: Internal Medicine

## 2020-08-28 ENCOUNTER — Other Ambulatory Visit: Payer: Self-pay

## 2020-08-28 ENCOUNTER — Encounter: Payer: Self-pay | Admitting: Internal Medicine

## 2020-08-28 ENCOUNTER — Ambulatory Visit: Payer: Managed Care, Other (non HMO) | Admitting: Internal Medicine

## 2020-08-28 VITALS — BP 148/86 | HR 68 | Temp 98.1°F | Ht 65.5 in | Wt 323.8 lb

## 2020-08-28 DIAGNOSIS — E559 Vitamin D deficiency, unspecified: Secondary | ICD-10-CM

## 2020-08-28 DIAGNOSIS — R21 Rash and other nonspecific skin eruption: Secondary | ICD-10-CM | POA: Diagnosis not present

## 2020-08-28 DIAGNOSIS — E034 Atrophy of thyroid (acquired): Secondary | ICD-10-CM | POA: Diagnosis not present

## 2020-08-28 DIAGNOSIS — I10 Essential (primary) hypertension: Secondary | ICD-10-CM | POA: Diagnosis not present

## 2020-08-28 DIAGNOSIS — R7309 Other abnormal glucose: Secondary | ICD-10-CM | POA: Diagnosis not present

## 2020-08-28 LAB — URIC ACID: Uric Acid, Serum: 6.8 mg/dL (ref 2.4–7.0)

## 2020-08-28 LAB — COMPREHENSIVE METABOLIC PANEL
ALT: 33 U/L (ref 0–35)
AST: 37 U/L (ref 0–37)
Albumin: 3.8 g/dL (ref 3.5–5.2)
Alkaline Phosphatase: 85 U/L (ref 39–117)
BUN: 10 mg/dL (ref 6–23)
CO2: 29 mEq/L (ref 19–32)
Calcium: 9.6 mg/dL (ref 8.4–10.5)
Chloride: 106 mEq/L (ref 96–112)
Creatinine, Ser: 0.78 mg/dL (ref 0.40–1.20)
GFR: 79.58 mL/min (ref >60.00)
Glucose, Bld: 108 mg/dL — ABNORMAL HIGH (ref 70–99)
Potassium: 3.1 mEq/L — ABNORMAL LOW (ref 3.5–5.1)
Sodium: 145 mEq/L (ref 135–145)
Total Bilirubin: 0.6 mg/dL (ref 0.2–1.2)
Total Protein: 7.5 g/dL (ref 6.0–8.3)

## 2020-08-28 LAB — HEMOGLOBIN A1C: Hgb A1c MFr Bld: 5.8 % (ref 4.6–6.5)

## 2020-08-28 LAB — TSH: TSH: 3.16 u[IU]/mL (ref 0.35–4.50)

## 2020-08-28 MED ORDER — DOXYCYCLINE HYCLATE 100 MG PO TABS: 100.0000 mg | ORAL_TABLET | Freq: Two times a day (BID) | ORAL | 0 refills | Status: DC

## 2020-08-28 NOTE — Assessment & Plan Note (Signed)
New rash on L cheek x 2 weeks - papules w/discoloration Doxy po. Derm ref offered

## 2020-08-28 NOTE — Assessment & Plan Note (Signed)
Chronic On Amlodipine, Losartan Elevated BP - take your meds this am

## 2020-08-28 NOTE — Assessment & Plan Note (Signed)
Check A1c. 

## 2020-08-28 NOTE — Progress Notes (Signed)
Subjective:  Patient ID: Tracy Sampson, female    DOB: May 28, 1955  Age: 65 y.o. MRN: [id number]  CC: Rash ((L) side of her face)   HPI Tracy Sampson presents for rash on L cheek x 2 weeks - pimples w/discoloration F/u HTN, obesity,  Outpatient Medications Prior to Visit  Medication Sig Dispense Refill  . amLODipine (NORVASC) 5 MG tablet TAKE 1 TABLET(5 MG) BY MOUTH DAILY 90 tablet 3  . cholecalciferol (VITAMIN D3) 25 MCG (1000 UNIT) tablet Take 1,000 Units by mouth daily.    . Cholecalciferol (VITAMIN D3) 50 MCG (2000 UT) capsule Take 1 capsule (2,000 Units total) by mouth daily. 100 capsule 3  . dorzolamide-timolol (COSOPT) 22.3-6.8 MG/ML ophthalmic solution Place 1 drop into both eyes 2 (two) times daily.     . famotidine (PEPCID) 40 MG tablet Take 1 tablet (40 mg total) by mouth daily. 90 tablet 2  . levothyroxine (SYNTHROID) 150 MCG tablet TAKE 1 TABLET(150 MCG) BY MOUTH DAILY BEFORE BREAKFAST 90 tablet 2  . losartan (COZAAR) 100 MG tablet TAKE 1 TABLET(100 MG) BY MOUTH DAILY 90 tablet 2  . oxybutynin (DITROPAN XL) 15 MG 24 hr tablet Take 1 tablet (15 mg total) by mouth at bedtime. 90 tablet 3  . potassium chloride (KLOR-CON) 10 MEQ tablet Take 1 tablet (10 mEq total) by mouth daily. 90 tablet 2  . sertraline (ZOLOFT) 100 MG tablet TAKE 1 TABLET(100 MG) BY MOUTH DAILY 90 tablet 2  . vitamin B-12 (CYANOCOBALAMIN) 500 MCG tablet Take 500 mcg by mouth daily.    . Cholecalciferol (VITAMIN D3) 1.25 MG (50000 UT) CAPS Take 1 capsule by mouth once a week. (Patient not taking: Reported on 08/28/2020) 8 capsule 0   No facility-administered medications prior to visit.    ROS: Review of Systems  Constitutional: Positive for unexpected weight change. Negative for activity change, appetite change, chills and fatigue.  HENT: Negative for congestion, mouth sores and sinus pressure.   Eyes: Negative for visual disturbance.  Respiratory: Negative for cough and chest tightness.    Gastrointestinal: Negative for abdominal pain and nausea.  Genitourinary: Negative for difficulty urinating, frequency and vaginal pain.  Musculoskeletal: Positive for gait problem. Negative for arthralgias and back pain.  Skin: Positive for color change and rash. Negative for pallor.  Neurological: Negative for dizziness, tremors, weakness, numbness and headaches.  Psychiatric/Behavioral: Negative for confusion and sleep disturbance.    Objective:  BP (!) 148/86 (BP Location: Left Arm)   Pulse 68   Temp 98.1 F (36.7 C) (Oral)   Ht 5' 5.5" (1.664 m)   Wt (!) 323 lb 12.8 oz (146.9 kg)   LMP  (LMP Unknown)   SpO2 98%   BMI 53.06 kg/m   BP Readings from Last 3 Encounters:  08/28/20 (!) 148/86  04/09/20 138/84  03/28/20 132/64    Wt Readings from Last 3 Encounters:  08/28/20 (!) 323 lb 12.8 oz (146.9 kg)  04/09/20 (!) 329 lb (149.2 kg)  03/28/20 (!) 326 lb 3.2 oz (148 kg)    Physical Exam Constitutional:      General: She is not in acute distress.    Appearance: She is well-developed. She is obese.  HENT:     Head: Normocephalic.     Right Ear: External ear normal.     Left Ear: External ear normal.     Nose: Nose normal.  Eyes:     General:        Right eye: No discharge.  Left eye: No discharge.     Conjunctiva/sclera: Conjunctivae normal.     Pupils: Pupils are equal, round, and reactive to light.  Neck:     Thyroid: No thyromegaly.     Vascular: No JVD.     Trachea: No tracheal deviation.  Cardiovascular:     Rate and Rhythm: Normal rate and regular rhythm.     Heart sounds: Normal heart sounds.  Pulmonary:     Effort: No respiratory distress.     Breath sounds: No stridor. No wheezing.  Abdominal:     General: Bowel sounds are normal. There is no distension.     Palpations: Abdomen is soft. There is no mass.     Tenderness: There is no abdominal tenderness. There is no guarding or rebound.  Musculoskeletal:        General: No tenderness.      Cervical back: Normal range of motion and neck supple.  Lymphadenopathy:     Cervical: No cervical adenopathy.  Skin:    Findings: Erythema, lesion and rash present.  Neurological:     Cranial Nerves: No cranial nerve deficit.     Motor: No abnormal muscle tone.     Coordination: Coordination normal.     Gait: Gait abnormal.     Deep Tendon Reflexes: Reflexes normal.  Psychiatric:        Behavior: Behavior normal.        Thought Content: Thought content normal.        Judgment: Judgment normal.   L cheek - hyperpigmented area w/several papules on it  Lab Results  Component Value Date   WBC 6.9 04/09/2020   HGB 12.9 04/09/2020   HCT 39.8 04/09/2020   PLT 237.0 04/09/2020   GLUCOSE 125 (H) 04/09/2020   CHOL 188 04/09/2020   TRIG 117.0 04/09/2020   HDL 43.60 04/09/2020   LDLDIRECT 149.3 10/23/2010   LDLCALC 121 (H) 04/09/2020   ALT 29 04/09/2020   AST 34 04/09/2020   NA 142 04/09/2020   K 3.4 (L) 04/09/2020   CL 106 04/09/2020   CREATININE 0.78 04/09/2020   BUN 12 04/09/2020   CO2 28 04/09/2020   TSH 6.39 (H) 04/09/2020   INR 1.1 (H) 09/05/2019   HGBA1C 5.7 05/09/2018    DG Bone Density  Result Date: 04/08/2020 EXAM: DUAL X-RAY ABSORPTIOMETRY (DXA) FOR BONE MINERAL DENSITY IMPRESSION: Tracy Sampson Your patient Tracy Sampson completed a BMD test on 04/08/2020 using the [place] (analysis version: 16.SP2) manufactured by [employer]. The following summarizes the results of our evaluation. [place] PATIENT: Name: Tracy, Sampson Patient ID: [id number] Birth Date: January 10, 1956 Height: 65.5 in. Gender: Female Measured: 04/08/2020 Weight: 326.0 lbs. Indications: African-American, Estrogen Deficiency, Hysterectomy, Low Calcium Intake, Oophorectomy ( Bilateral), Post Menopausal Fractures: Foot Treatments: Levothyroxine, Vitamin D ASSESSMENT: The BMD measured at Femur Neck Left is 0.955 g/cm2 with a T-score of -0.6. This patient is considered NORMAL according to  [facility]) criteria. L1 & L4 were excluded due to degenerative changes. The scan quality is good. Site Region Measured Date Measured Age WHO YA BMD Classification T-score AP Spine L2-L3 04/08/2020 65.2 Normal 1.6 1.386 g/cm2 DualFemur Neck Right 04/08/2020 65.2 years Normal -0.5 0.965 g/cm2 World Health Organization [employer]) criteria for post-menopausal, Caucasian Women: Normal        T-score at or above -1 SD Low Bone Mass T-score between -1 and -2.5 SD Osteoporosis  T-score at or below -2.5 SD RECOMMENDATION: 1. All patients should  optimize calcium and vitamin D intake. 2. Consider FDA-approved medical therapies in postmenopausal women and men aged 93 years and older, based on the following: a. A hip or vertebral(clinical or morphometric) fracture. b. T-score < -2.5 at the femoral neck or spine after appropriate evaluation to exclude secondary causes c. Low bone mass (T-score between -1.0 and -2.5 at the femoral neck or spine) and a 10 year probability of a hip fracture >3% or a 10 year probability of major osteoporosis-related fracture > 20% based on the [place]-adapted WHO algorithm d. Clinical judgement and/or patient preferences may indicate treatment for people with 10-year fracture probabilities above or below these levels FOLLOW-UP: Patients with diagnosis of osteoporosis or at high risk for fracture should have regular bone mineral density tests. For patients eligible for Medicare, routine testing is allowed once every 2 years. The testing frequency can be increased to one year for patients who have rapidly progressing disease, those who are receiving or discontinuing medical therapy to restore bone mass, or have additional risk factors. I have reviewed this report and agree with the above findings. Mark A. Thornton Papas, M.D. [facility] Radiology Electronically Signed   By: Lavonia Dana M.D.   On: 04/08/2020 14:47    Assessment & Plan:      Follow-up: No follow-ups on file.  Walker Kehr,  MD

## 2020-08-28 NOTE — Assessment & Plan Note (Signed)
Pt lost wt 

## 2020-08-28 NOTE — Addendum Note (Signed)
Addended by: Boris Lown B on: 08/28/2020 08:28 AM   Modules accepted: Orders

## 2020-08-28 NOTE — Assessment & Plan Note (Signed)
On Vit D 

## 2020-09-02 ENCOUNTER — Telehealth: Payer: Self-pay | Admitting: Internal Medicine

## 2020-09-02 NOTE — Telephone Encounter (Signed)
Patient called and said that she had not been taking her potassium. She said that she will start taking them regularly. She can be reached at [phone].

## 2020-09-03 NOTE — Telephone Encounter (Signed)
Msg was sent to MD.. See lab report.Marland KitchenJohny Chess

## 2020-09-04 ENCOUNTER — Telehealth: Payer: Self-pay | Admitting: Internal Medicine

## 2020-09-04 MED ORDER — POTASSIUM CHLORIDE ER 10 MEQ PO TBCR: 10.0000 meq | EXTENDED_RELEASE_TABLET | Freq: Every day | ORAL | 1 refills | Status: DC

## 2020-09-04 NOTE — Telephone Encounter (Signed)
Reviewed chart pt is up-to-date sent refills to pof.../lmb  

## 2020-09-04 NOTE — Telephone Encounter (Signed)
1.Medication Requested: potassium chloride (KLOR-CON) 10 MEQ tablet    2. Pharmacy (Name, Street, City):[id number] - [place], [place]   3. On Med List: yes   4. Last Visit with PCP: 08-28-20  5. Next visit date with PCP: n/a   Patient is requesting a 90 day supply    Agent: Please be advised that RX refills may take up to 3 business days. We ask that you follow-up with your pharmacy.

## 2020-09-16 ENCOUNTER — Telehealth: Payer: Self-pay | Admitting: Internal Medicine

## 2020-09-16 NOTE — Telephone Encounter (Signed)
Patient called and was wondering if Dr. Alain Marion was going to send something in for her muscle spasms. Please advise

## 2020-09-16 NOTE — Telephone Encounter (Signed)
   Patient calling to report she is having muscle spasm  She has been taking Tylenol She is requesting advice/ prescription for muscle relaxer Declined appointment at this time   Seeking advice

## 2020-09-17 MED ORDER — CYCLOBENZAPRINE HCL 5 MG PO TABS: 5.0000 mg | ORAL_TABLET | Freq: Three times a day (TID) | ORAL | 0 refills | Status: AC | PRN

## 2020-09-17 NOTE — Telephone Encounter (Signed)
Notified pt w/MD response.../lmb 

## 2020-09-17 NOTE — Telephone Encounter (Signed)
Ok Done - [facility] if not better Thx

## 2020-10-02 ENCOUNTER — Other Ambulatory Visit: Payer: Self-pay

## 2020-10-02 ENCOUNTER — Ambulatory Visit: Payer: Managed Care, Other (non HMO) | Admitting: Internal Medicine

## 2020-10-02 ENCOUNTER — Encounter: Payer: Self-pay | Admitting: Internal Medicine

## 2020-10-02 DIAGNOSIS — F3341 Major depressive disorder, recurrent, in partial remission: Secondary | ICD-10-CM

## 2020-10-02 DIAGNOSIS — R202 Paresthesia of skin: Secondary | ICD-10-CM | POA: Diagnosis not present

## 2020-10-02 DIAGNOSIS — M542 Cervicalgia: Secondary | ICD-10-CM

## 2020-10-02 DIAGNOSIS — I10 Essential (primary) hypertension: Secondary | ICD-10-CM | POA: Diagnosis not present

## 2020-10-02 DIAGNOSIS — E876 Hypokalemia: Secondary | ICD-10-CM

## 2020-10-02 LAB — BASIC METABOLIC PANEL
BUN: 14 mg/dL (ref 6–23)
CO2: 28 mEq/L (ref 19–32)
Calcium: 9.6 mg/dL (ref 8.4–10.5)
Chloride: 106 mEq/L (ref 96–112)
Creatinine, Ser: 0.81 mg/dL (ref 0.40–1.20)
GFR: 76 mL/min (ref >60.00)
Glucose, Bld: 112 mg/dL — ABNORMAL HIGH (ref 70–99)
Potassium: 3.4 mEq/L — ABNORMAL LOW (ref 3.5–5.1)
Sodium: 145 mEq/L (ref 135–145)

## 2020-10-02 MED ORDER — SERTRALINE HCL 100 MG PO TABS: ORAL_TABLET | ORAL | 3 refills | Status: DC

## 2020-10-02 MED ORDER — AMLODIPINE BESYLATE 5 MG PO TABS: ORAL_TABLET | ORAL | 3 refills | Status: DC

## 2020-10-02 MED ORDER — MELOXICAM 7.5 MG PO TABS: 7.5000 mg | ORAL_TABLET | Freq: Every day | ORAL | 0 refills | Status: DC

## 2020-10-02 MED ORDER — LEVOTHYROXINE SODIUM 150 MCG PO TABS: ORAL_TABLET | ORAL | 3 refills | Status: DC

## 2020-10-02 NOTE — Progress Notes (Signed)
Subjective:  Patient ID: Tracy Sampson, female    DOB: 28-Aug-1955  Age: 65 y.o. MRN: [id number]  CC: Spasms   HPI NEIMA LACROSS presents for neck pain - R side, burning on the R neck F/u on a low K, HTN  Outpatient Medications Prior to Visit  Medication Sig Dispense Refill  . amLODipine (NORVASC) 5 MG tablet TAKE 1 TABLET(5 MG) BY MOUTH DAILY 90 tablet 3  . cholecalciferol (VITAMIN D3) 25 MCG (1000 UNIT) tablet Take 1,000 Units by mouth daily.    . Cholecalciferol (VITAMIN D3) 50 MCG (2000 UT) capsule Take 1 capsule (2,000 Units total) by mouth daily. 100 capsule 3  . cyclobenzaprine (FLEXERIL) 5 MG tablet Take 1 tablet (5 mg total) by mouth 3 (three) times daily as needed for muscle spasms. 30 tablet 0  . dorzolamide-timolol (COSOPT) 22.3-6.8 MG/ML ophthalmic solution Place 1 drop into both eyes 2 (two) times daily.     . famotidine (PEPCID) 40 MG tablet Take 1 tablet (40 mg total) by mouth daily. 90 tablet 2  . levothyroxine (SYNTHROID) 150 MCG tablet TAKE 1 TABLET(150 MCG) BY MOUTH DAILY BEFORE BREAKFAST 90 tablet 2  . losartan (COZAAR) 100 MG tablet TAKE 1 TABLET(100 MG) BY MOUTH DAILY 90 tablet 2  . oxybutynin (DITROPAN XL) 15 MG 24 hr tablet Take 1 tablet (15 mg total) by mouth at bedtime. 90 tablet 3  . potassium chloride (KLOR-CON) 10 MEQ tablet Take 1 tablet (10 mEq total) by mouth daily. 90 tablet 1  . sertraline (ZOLOFT) 100 MG tablet TAKE 1 TABLET(100 MG) BY MOUTH DAILY 90 tablet 2  . vitamin B-12 (CYANOCOBALAMIN) 500 MCG tablet Take 500 mcg by mouth daily.    [name] doxycycline (VIBRA-TABS) 100 MG tablet Take 1 tablet (100 mg total) by mouth 2 (two) times daily. (Patient not taking: Reported on 10/02/2020) 28 tablet 0   No facility-administered medications prior to visit.    ROS: Review of Systems  Constitutional: Negative for activity change, appetite change, chills, fatigue and unexpected weight change.  HENT: Negative for congestion, mouth sores and sinus pressure.    Eyes: Negative for visual disturbance.  Respiratory: Negative for cough and chest tightness.   Gastrointestinal: Negative for abdominal pain and nausea.  Genitourinary: Negative for difficulty urinating, frequency and vaginal pain.  Musculoskeletal: Positive for neck pain and neck stiffness. Negative for back pain and gait problem.  Skin: Negative for pallor and rash.  Neurological: Negative for dizziness, tremors, weakness, numbness and headaches.  Psychiatric/Behavioral: Negative for confusion and sleep disturbance.    Objective:  BP (!) 172/90 (BP Location: Left Arm)   Pulse 77   Temp 98.7 F (37.1 C) (Oral)   Ht 5' 5.5" (1.664 m)   Wt (!) 318 lb 12.8 oz (144.6 kg)   LMP  (LMP Unknown)   SpO2 93%   BMI 52.24 kg/m   BP Readings from Last 3 Encounters:  10/02/20 (!) 172/90  08/28/20 (!) 148/86  04/09/20 138/84    Wt Readings from Last 3 Encounters:  10/02/20 (!) 318 lb 12.8 oz (144.6 kg)  08/28/20 (!) 323 lb 12.8 oz (146.9 kg)  04/09/20 (!) 329 lb (149.2 kg)    Physical Exam Constitutional:      General: She is not in acute distress.    Appearance: She is well-developed.  HENT:     Head: Normocephalic.     Right Ear: External ear normal.     Left Ear: External ear normal.  Nose: Nose normal.  Eyes:     General:        Right eye: No discharge.        Left eye: No discharge.     Conjunctiva/sclera: Conjunctivae normal.     Pupils: Pupils are equal, round, and reactive to light.  Neck:     Thyroid: No thyromegaly.     Vascular: No JVD.     Trachea: No tracheal deviation.  Cardiovascular:     Rate and Rhythm: Normal rate and regular rhythm.     Heart sounds: Normal heart sounds.  Pulmonary:     Effort: No respiratory distress.     Breath sounds: No stridor. No wheezing.  Abdominal:     General: Bowel sounds are normal. There is no distension.     Palpations: Abdomen is soft. There is no mass.     Tenderness: There is no abdominal tenderness. There is  no guarding or rebound.  Musculoskeletal:     Cervical back: Normal range of motion and neck supple. Tenderness present.  Lymphadenopathy:     Cervical: No cervical adenopathy.  Skin:    Findings: No erythema or rash.  Neurological:     Mental Status: She is oriented to person, place, and time.     Cranial Nerves: No cranial nerve deficit.     Motor: No abnormal muscle tone.     Coordination: Coordination normal.     Deep Tendon Reflexes: Reflexes normal.  Psychiatric:        Behavior: Behavior normal.        Thought Content: Thought content normal.        Judgment: Judgment normal.    MS, DTRs WNL  Lab Results  Component Value Date   WBC 6.9 04/09/2020   HGB 12.9 04/09/2020   HCT 39.8 04/09/2020   PLT 237.0 04/09/2020   GLUCOSE 108 (H) 08/28/2020   CHOL 188 04/09/2020   TRIG 117.0 04/09/2020   HDL 43.60 04/09/2020   LDLDIRECT 149.3 10/23/2010   LDLCALC 121 (H) 04/09/2020   ALT 33 08/28/2020   AST 37 08/28/2020   NA 145 08/28/2020   K 3.1 (L) 08/28/2020   CL 106 08/28/2020   CREATININE 0.78 08/28/2020   BUN 10 08/28/2020   CO2 29 08/28/2020   TSH 3.16 08/28/2020   INR 1.1 (H) 09/05/2019   HGBA1C 5.8 08/28/2020    DG Bone Density  Result Date: 04/08/2020 EXAM: DUAL X-RAY ABSORPTIOMETRY (DXA) FOR BONE MINERAL DENSITY IMPRESSION: [place] Your patient Tracy Sampson completed a BMD test on 04/08/2020 using the [place] (analysis version: 16.SP2) manufactured by [employer]. The following summarizes the results of our evaluation. [place] PATIENT: Name: Tracy Sampson, Breed Patient ID: [id number] Birth Date: 1955-08-14 Height: 65.5 in. Gender: Female Measured: 04/08/2020 Weight: 326.0 lbs. Indications: African-American, Estrogen Deficiency, Hysterectomy, Low Calcium Intake, Oophorectomy ( Bilateral), Post Menopausal Fractures: Foot Treatments: Levothyroxine, Vitamin D ASSESSMENT: The BMD measured at Femur Neck Left is 0.955 g/cm2 with a T-score of -0.6.  This patient is considered NORMAL according to [facility]) criteria. L1 & L4 were excluded due to degenerative changes. The scan quality is good. Site Region Measured Date Measured Age WHO YA BMD Classification T-score AP Spine L2-L3 04/08/2020 65.2 Normal 1.6 1.386 g/cm2 DualFemur Neck Right 04/08/2020 65.2 years Normal -0.5 0.965 g/cm2 World Health Organization [facility]) criteria for post-menopausal, Caucasian Women: Normal        T-score at or above -1 SD Low Bone  Mass T-score between -1 and -2.5 SD Osteoporosis  T-score at or below -2.5 SD RECOMMENDATION: 1. All patients should optimize calcium and vitamin D intake. 2. Consider FDA-approved medical therapies in postmenopausal women and men aged 22 years and older, based on the following: a. A hip or vertebral(clinical or morphometric) fracture. b. T-score < -2.5 at the femoral neck or spine after appropriate evaluation to exclude secondary causes c. Low bone mass (T-score between -1.0 and -2.5 at the femoral neck or spine) and a 10 year probability of a hip fracture >3% or a 10 year probability of major osteoporosis-related fracture > 20% based on the [place]-adapted WHO algorithm d. Clinical judgement and/or patient preferences may indicate treatment for people with 10-year fracture probabilities above or below these levels FOLLOW-UP: Patients with diagnosis of osteoporosis or at high risk for fracture should have regular bone mineral density tests. For patients eligible for Medicare, routine testing is allowed once every 2 years. The testing frequency can be increased to one year for patients who have rapidly progressing disease, those who are receiving or discontinuing medical therapy to restore bone mass, or have additional risk factors. I have reviewed this report and agree with the above findings. Mark A. Thornton Papas, M.D. [facility] Radiology Electronically Signed   By: Lavonia Dana M.D.   On: 04/08/2020 14:47    Assessment & Plan:    Walker Kehr, MD

## 2020-10-02 NOTE — Assessment & Plan Note (Addendum)
BP ok at home Cont w/NAS diet  Amlodipine, Losartan

## 2020-10-02 NOTE — Addendum Note (Signed)
Addended by: Boris Lown B on: 10/02/2020 08:42 AM   Modules accepted: Orders

## 2020-10-02 NOTE — Assessment & Plan Note (Signed)
On Zoloft. °

## 2020-10-02 NOTE — Assessment & Plan Note (Addendum)
Worse Get a new office chair Flexeril prn Meloxicam x 1 month pc

## 2020-10-02 NOTE — Assessment & Plan Note (Signed)
On KCl Check BMET

## 2020-10-02 NOTE — Assessment & Plan Note (Signed)
Resolved

## 2021-01-08 ENCOUNTER — Other Ambulatory Visit: Payer: Self-pay | Admitting: Internal Medicine

## 2021-02-07 ENCOUNTER — Other Ambulatory Visit: Payer: Self-pay | Admitting: Internal Medicine

## 2021-02-13 ENCOUNTER — Other Ambulatory Visit (HOSPITAL_BASED_OUTPATIENT_CLINIC_OR_DEPARTMENT_OTHER): Payer: Self-pay | Admitting: Internal Medicine

## 2021-02-13 ENCOUNTER — Telehealth (HOSPITAL_BASED_OUTPATIENT_CLINIC_OR_DEPARTMENT_OTHER): Payer: Self-pay

## 2021-02-13 DIAGNOSIS — Z1231 Encounter for screening mammogram for malignant neoplasm of breast: Secondary | ICD-10-CM

## 2021-03-04 ENCOUNTER — Telehealth (HOSPITAL_BASED_OUTPATIENT_CLINIC_OR_DEPARTMENT_OTHER): Payer: Self-pay

## 2021-03-06 ENCOUNTER — Telehealth (HOSPITAL_BASED_OUTPATIENT_CLINIC_OR_DEPARTMENT_OTHER): Payer: Self-pay

## 2021-03-07 ENCOUNTER — Telehealth (HOSPITAL_BASED_OUTPATIENT_CLINIC_OR_DEPARTMENT_OTHER): Payer: Self-pay

## 2021-03-11 ENCOUNTER — Ambulatory Visit (HOSPITAL_BASED_OUTPATIENT_CLINIC_OR_DEPARTMENT_OTHER): Payer: Managed Care, Other (non HMO)

## 2021-03-24 ENCOUNTER — Ambulatory Visit (HOSPITAL_BASED_OUTPATIENT_CLINIC_OR_DEPARTMENT_OTHER): Payer: Managed Care, Other (non HMO)

## 2021-04-02 ENCOUNTER — Other Ambulatory Visit: Payer: Self-pay

## 2021-04-02 ENCOUNTER — Encounter: Payer: Self-pay | Admitting: Internal Medicine

## 2021-04-02 ENCOUNTER — Ambulatory Visit (INDEPENDENT_AMBULATORY_CARE_PROVIDER_SITE_OTHER): Payer: Managed Care, Other (non HMO) | Admitting: Internal Medicine

## 2021-04-02 DIAGNOSIS — S5012XA Contusion of left forearm, initial encounter: Secondary | ICD-10-CM

## 2021-04-02 DIAGNOSIS — E876 Hypokalemia: Secondary | ICD-10-CM

## 2021-04-02 DIAGNOSIS — I10 Essential (primary) hypertension: Secondary | ICD-10-CM

## 2021-04-02 DIAGNOSIS — R7301 Impaired fasting glucose: Secondary | ICD-10-CM

## 2021-04-02 DIAGNOSIS — S5010XA Contusion of unspecified forearm, initial encounter: Secondary | ICD-10-CM | POA: Insufficient documentation

## 2021-04-02 MED ORDER — RYBELSUS 3 MG PO TABS: ORAL_TABLET | ORAL | 1 refills | Status: DC

## 2021-04-02 NOTE — Assessment & Plan Note (Signed)
On KCl 

## 2021-04-02 NOTE — Assessment & Plan Note (Signed)
Will start Rybelsus

## 2021-04-02 NOTE — Assessment & Plan Note (Signed)
L forearm - bruise after venipuncture - ulnar aspect ACE wrap  rest

## 2021-04-02 NOTE — Progress Notes (Signed)
Subjective:  Patient ID: Tracy Sampson, female    DOB: Oct 09, 1955  Age: 64 y.o. MRN: [id number]  CC: No chief complaint on file.   HPI Tracy Sampson presents for hyperglycemia, HTN, obesity SBP was 190 once C/o L forearm - bruise after venipuncture - ulnar aspect  Outpatient Medications Prior to Visit  Medication Sig Dispense Refill   amLODipine (NORVASC) 5 MG tablet 1 po qd 90 tablet 3   cholecalciferol (VITAMIN D3) 25 MCG (1000 UNIT) tablet Take 1,000 Units by mouth daily.     Cholecalciferol (VITAMIN D3) 50 MCG (2000 UT) capsule Take 1 capsule (2,000 Units total) by mouth daily. 100 capsule 3   cyclobenzaprine (FLEXERIL) 5 MG tablet Take 1 tablet (5 mg total) by mouth 3 (three) times daily as needed for muscle spasms. 30 tablet 0   dorzolamide-timolol (COSOPT) 22.3-6.8 MG/ML ophthalmic solution Place 1 drop into both eyes 2 (two) times daily.      famotidine (PEPCID) 40 MG tablet Take 1 tablet (40 mg total) by mouth daily. 90 tablet 2   levothyroxine (SYNTHROID) 150 MCG tablet TAKE 1 TABLET(150 MCG) BY MOUTH DAILY BEFORE BREAKFAST 90 tablet 3   losartan (COZAAR) 100 MG tablet TAKE 1 TABLET(100 MG) BY MOUTH DAILY 90 tablet 2   potassium chloride (KLOR-CON) 10 MEQ tablet TAKE 1 TABLET BY MOUTH EVERY DAY 90 tablet 3   sertraline (ZOLOFT) 100 MG tablet TAKE 1 TABLET(100 MG) BY MOUTH DAILY 90 tablet 3   vitamin B-12 (CYANOCOBALAMIN) 500 MCG tablet Take 500 mcg by mouth daily.     meloxicam (MOBIC) 7.5 MG tablet TAKE 1 TABLET BY MOUTH EVERY DAY (Patient not taking: Reported on 04/02/2021) 30 tablet 0   oxybutynin (DITROPAN XL) 15 MG 24 hr tablet Take 1 tablet (15 mg total) by mouth at bedtime. (Patient not taking: Reported on 04/02/2021) 90 tablet 3   No facility-administered medications prior to visit.    ROS: Review of Systems  Constitutional:  Negative for activity change, appetite change, chills, fatigue and unexpected weight change.  HENT:  Negative for congestion, mouth  sores and sinus pressure.   Eyes:  Negative for visual disturbance.  Respiratory:  Negative for cough and chest tightness.   Gastrointestinal:  Negative for abdominal pain and nausea.  Genitourinary:  Negative for difficulty urinating, frequency and vaginal pain.  Musculoskeletal:  Negative for back pain and gait problem.  Skin:  Negative for pallor and rash.  Neurological:  Negative for dizziness, tremors, weakness, numbness and headaches.  Psychiatric/Behavioral:  Negative for confusion and sleep disturbance. The patient is not nervous/anxious.    Objective:  BP 124/82 (BP Location: Left Arm, Patient Position: Sitting, Cuff Size: Large)   Pulse 68   Temp 98.5 F (36.9 C) (Oral)   Ht 5' 5.5" (1.664 m)   Wt (!) 315 lb (142.9 kg)   LMP  (LMP Unknown)   SpO2 93%   BMI 51.62 kg/m   BP Readings from Last 3 Encounters:  04/02/21 124/82  10/02/20 (!) 172/90  08/28/20 (!) 148/86    Wt Readings from Last 3 Encounters:  04/02/21 (!) 315 lb (142.9 kg)  10/02/20 (!) 318 lb 12.8 oz (144.6 kg)  08/28/20 (!) 323 lb 12.8 oz (146.9 kg)    Physical Exam Constitutional:      General: She is not in acute distress.    Appearance: She is well-developed. She is obese.  HENT:     Head: Normocephalic.     Right Ear: External  ear normal.     Left Ear: External ear normal.     Nose: Nose normal.  Eyes:     General:        Right eye: No discharge.        Left eye: No discharge.     Conjunctiva/sclera: Conjunctivae normal.     Pupils: Pupils are equal, round, and reactive to light.  Neck:     Thyroid: No thyromegaly.     Vascular: No JVD.     Trachea: No tracheal deviation.  Cardiovascular:     Rate and Rhythm: Normal rate and regular rhythm.     Heart sounds: Normal heart sounds.  Pulmonary:     Effort: No respiratory distress.     Breath sounds: No stridor. No wheezing.  Abdominal:     General: Bowel sounds are normal. There is no distension.     Palpations: Abdomen is soft. There  is no mass.     Tenderness: There is no abdominal tenderness. There is no guarding or rebound.  Musculoskeletal:        General: No tenderness.     Cervical back: Normal range of motion and neck supple. No rigidity.  Lymphadenopathy:     Cervical: No cervical adenopathy.  Skin:    Findings: No erythema or rash.  Neurological:     Cranial Nerves: No cranial nerve deficit.     Motor: No abnormal muscle tone.     Coordination: Coordination normal.     Deep Tendon Reflexes: Reflexes normal.  Psychiatric:        Behavior: Behavior normal.        Thought Content: Thought content normal.        Judgment: Judgment normal.   L forearm - bruise after venipuncture - ulnar aspect  Lab Results  Component Value Date   WBC 6.9 04/09/2020   HGB 12.9 04/09/2020   HCT 39.8 04/09/2020   PLT 237.0 04/09/2020   GLUCOSE 112 (H) 10/02/2020   CHOL 188 04/09/2020   TRIG 117.0 04/09/2020   HDL 43.60 04/09/2020   LDLDIRECT 149.3 10/23/2010   LDLCALC 121 (H) 04/09/2020   ALT 33 08/28/2020   AST 37 08/28/2020   NA 145 10/02/2020   K 3.4 (L) 10/02/2020   CL 106 10/02/2020   CREATININE 0.81 10/02/2020   BUN 14 10/02/2020   CO2 28 10/02/2020   TSH 3.16 08/28/2020   INR 1.1 (H) 09/05/2019   HGBA1C 5.8 08/28/2020    DG Bone Density  Result Date: 04/08/2020 EXAM: DUAL X-RAY ABSORPTIOMETRY (DXA) FOR BONE MINERAL DENSITY IMPRESSION: [place] Your patient Tracy Sampson completed a BMD test on 04/08/2020 using the [place] (analysis version: 16.SP2) manufactured by [employer]. The following summarizes the results of our evaluation. [place] PATIENT: Name: Tracy Sampson, Tracy Sampson Patient ID: [id number] Birth Date: 11-Dec-1955 Height: 65.5 in. Gender: Female Measured: 04/08/2020 Weight: 326.0 lbs. Indications: African-American, Estrogen Deficiency, Hysterectomy, Low Calcium Intake, Oophorectomy ( Bilateral), Post Menopausal Fractures: Foot Treatments: Levothyroxine, Vitamin D ASSESSMENT: The  BMD measured at Femur Neck Left is 0.955 g/cm2 with a T-score of -0.6. This patient is considered NORMAL according to [place] [facility]) criteria. L1 & L4 were excluded due to degenerative changes. The scan quality is good. Site Region Measured Date Measured Age WHO YA BMD Classification T-score AP Spine L2-L3 04/08/2020 65.2 Normal 1.6 1.386 g/cm2 DualFemur Neck Right 04/08/2020 65.2 [age] Normal -0.5 0.965 g/cm2 World Health Organization [facility]) criteria for post-menopausal, Caucasian Women:  Normal        T-score at or above -1 SD Low Bone Mass T-score between -1 and -2.5 SD Osteoporosis  T-score at or below -2.5 SD RECOMMENDATION: 1. All patients should optimize calcium and vitamin D intake. 2. Consider FDA-approved medical therapies in postmenopausal women and men aged 50 years and older, based on the following: a. A hip or vertebral(clinical or morphometric) fracture. b. T-score < -2.5 at the femoral neck or spine after appropriate evaluation to exclude secondary causes c. Low bone mass (T-score between -1.0 and -2.5 at the femoral neck or spine) and a 10 year probability of a hip fracture >3% or a 10 year probability of major osteoporosis-related fracture > 20% based on the [place]-adapted WHO algorithm d. Clinical judgement and/or patient preferences may indicate treatment for people with 10-year fracture probabilities above or below these levels FOLLOW-UP: Patients with diagnosis of osteoporosis or at high risk for fracture should have regular bone mineral density tests. For patients eligible for Medicare, routine testing is allowed once every 2 years. The testing frequency can be increased to one year for patients who have rapidly progressing disease, those who are receiving or discontinuing medical therapy to restore bone mass, or have additional risk factors. I have reviewed this report and agree with the above findings. Mark A. Thornton Papas, M.D. [facility] Radiology Electronically Signed   By: Lavonia Dana M.D.   On: 04/08/2020 14:47    Assessment & Plan:   Problem List Items Addressed This Visit     Elevated fasting glucose    Will start Rybelsus      Essential hypertension    Will start Rybelsus to help w/wt loss Cont on Amlodipine, Losartan      Hypokalemia    On KCl      OBESITY, MORBID    Will start Rybelsus. BMI 51      Relevant Medications   Semaglutide (RYBELSUS) 3 MG TABS   Traumatic hematoma of forearm    L forearm - bruise after venipuncture - ulnar aspect ACE wrap  rest         Meds ordered this encounter  Medications   Semaglutide (RYBELSUS) 3 MG TABS    Sig: 1 po qam    Dispense:  30 tablet    Refill:  1    R73.01      Follow-up: Return in about 3 months (around 07/03/2021) for a follow-up visit.  Walker Kehr, MD

## 2021-04-02 NOTE — Assessment & Plan Note (Signed)
Will start Rybelsus to help w/wt loss Cont on Amlodipine, Losartan

## 2021-04-02 NOTE — Assessment & Plan Note (Addendum)
Will start Rybelsus. BMI 51

## 2021-04-14 NOTE — Progress Notes (Deleted)
65 y.o. G0P0000 Single Black or African American Not Hispanic or Latino female here for annual exam.      No LMP recorded (lmp unknown). Patient has had a hysterectomy.          Sexually active: {yes no:314532}  The current method of family planning is status post hysterectomy.    Exercising: {yes no:314532}  {types:19826} Smoker:  {YES [id number]  Health Maintenance: Pap:  11/6/20WNL   11/16/2016 WNL NEG HPV, 07-11-14 WNL NEG HR HPV History of abnormal Pap:  no MMG:  03/12/20 density B Bi-rads 1 neg  BMD:   04/08/20 normal  Colonoscopy: 12/19/19 polyps removed. Recommended repeat in 7 years  TDaP:  12/04/14  Gardasil: n/a    reports that she has never smoked. She has never used smokeless tobacco. She reports that she does not drink alcohol and does not use drugs.  Past Medical History:  Diagnosis Date   Allergic rhinitis    Allergy    Arthritis    Cataract    bilateral removed   Depression    GERD (gastroesophageal reflux disease)    Glaucoma    Hypertension    Hypothyroidism    Morbid obesity (HCC)    OSA (obstructive sleep apnea)    Dr Joya Gaskins   Sleep apnea    wears cpap   Thyroid disease    Urinary incontinence     Past Surgical History:  Procedure Laterality Date   abdominal supracervical hysterectomy with bilateral salpingo-oophorectomy     fibroid uterus   ANKLE SURGERY  4/10   rt   BREAST REDUCTION SURGERY     CATARACT EXTRACTION     COLONOSCOPY     COLONOSCOPY WITH PROPOFOL N/A 12/19/2019   Procedure: COLONOSCOPY WITH PROPOFOL;  Surgeon: Mauri Pole, MD;  Location: [facility];  Service: Endoscopy;  Laterality: N/A;   HERNIA REPAIR  8182   umbillical   HYSTEROSCOPY N/A 07/25/2019   Procedure: HYSTEROSCOPY OF CERVIX WITH ULTRASOUND GUIDANCE/KS;  Surgeon: Salvadore Dom, MD;  Location: [facility];  Service: Gynecology;  Laterality: N/A;   INSERTION OF MESH N/A 01/07/2016   Procedure: INSERTION OF MESH;  Surgeon: Coralie Keens, MD;  Location: [facility];   Service: General;  Laterality: N/A;   OPERATIVE ULTRASOUND N/A 07/25/2019   Procedure: OPERATIVE ULTRASOUND;  Surgeon: Salvadore Dom, MD;  Location: [facility];  Service: Gynecology;  Laterality: N/A;   POLYPECTOMY  12/19/2019   Procedure: POLYPECTOMY;  Surgeon: Mauri Pole, MD;  Location: [facility];  Service: Endoscopy;;   REDUCTION MAMMAPLASTY     VENTRAL HERNIA REPAIR N/A 01/07/2016   Procedure: VENTRAL HERNIA REPAIR;  Surgeon: Coralie Keens, MD;  Location: [facility];  Service: General;  Laterality: N/A;    Current Outpatient Medications  Medication Sig Dispense Refill   amLODipine (NORVASC) 5 MG tablet 1 po qd 90 tablet 3   cholecalciferol (VITAMIN D3) 25 MCG (1000 UNIT) tablet Take 1,000 Units by mouth daily.     Cholecalciferol (VITAMIN D3) 50 MCG (2000 UT) capsule Take 1 capsule (2,000 Units total) by mouth daily. 100 capsule 3   cyclobenzaprine (FLEXERIL) 5 MG tablet Take 1 tablet (5 mg total) by mouth 3 (three) times daily as needed for muscle spasms. 30 tablet 0   dorzolamide-timolol (COSOPT) 22.3-6.8 MG/ML ophthalmic solution Place 1 drop into both eyes 2 (two) times daily.      famotidine (PEPCID) 40 MG tablet Take 1 tablet (40 mg total) by mouth daily. 90 tablet 2  levothyroxine (SYNTHROID) 150 MCG tablet TAKE 1 TABLET(150 MCG) BY MOUTH DAILY BEFORE BREAKFAST 90 tablet 3   losartan (COZAAR) 100 MG tablet TAKE 1 TABLET(100 MG) BY MOUTH DAILY 90 tablet 2   meloxicam (MOBIC) 7.5 MG tablet TAKE 1 TABLET BY MOUTH EVERY DAY (Patient not taking: Reported on 04/02/2021) 30 tablet 0   oxybutynin (DITROPAN XL) 15 MG 24 hr tablet Take 1 tablet (15 mg total) by mouth at bedtime. (Patient not taking: Reported on 04/02/2021) 90 tablet 3   potassium chloride (KLOR-CON) 10 MEQ tablet TAKE 1 TABLET BY MOUTH EVERY DAY 90 tablet 3   Semaglutide (RYBELSUS) 3 MG TABS 1 po qam 30 tablet 1   sertraline (ZOLOFT) 100 MG tablet TAKE 1 TABLET(100 MG) BY MOUTH DAILY 90 tablet 3   vitamin B-12  (CYANOCOBALAMIN) 500 MCG tablet Take 500 mcg by mouth daily.     No current facility-administered medications for this visit.    Family History  Problem Relation Age of Onset   Breast cancer Mother    Cancer Mother        breast   Hypertension Mother    Colon cancer Maternal Grandmother    Cancer Maternal Grandmother        colon    Review of Systems  Exam:   LMP  (LMP Unknown)   Weight change: @WEIGHTCHANGE @ Height:      Ht Readings from Last 3 Encounters:  04/02/21 5' 5.5" (1.664 m)  10/02/20 5' 5.5" (1.664 m)  08/28/20 5' 5.5" (1.664 m)    General appearance: alert, cooperative and appears stated age Head: Normocephalic, without obvious abnormality, atraumatic Neck: no adenopathy, supple, symmetrical, trachea midline and thyroid {CHL AMB PHY EX THYROID NORM DEFAULT:[id number]::"normal to inspection and palpation"} Lungs: clear to auscultation bilaterally Cardiovascular: regular rate and rhythm Breasts: {Exam; breast:13139::"normal appearance, no masses or tenderness"} Abdomen: soft, non-tender; non distended,  no masses,  no organomegaly Extremities: extremities normal, atraumatic, no cyanosis or edema Skin: Skin color, texture, turgor normal. No rashes or lesions Lymph nodes: Cervical, supraclavicular, and axillary nodes normal. No abnormal inguinal nodes palpated Neurologic: Grossly normal   Pelvic: External genitalia:  no lesions              Urethra:  normal appearing urethra with no masses, tenderness or lesions              Bartholins and Skenes: normal                 Vagina: normal appearing vagina with normal color and discharge, no lesions              Cervix: {CHL AMB PHY EX CERVIX NORM DEFAULT:[phone]::"no lesions"}               Bimanual Exam:  Uterus:  {CHL AMB PHY EX UTERUS NORM DEFAULT:[id number]::"normal size, contour, position, consistency, mobility, non-tender"}              Adnexa: {CHL AMB PHY EX ADNEXA NO MASS DEFAULT:[phone]::"no mass,  fullness, tenderness"}               Rectovaginal: Confirms               Anus:  normal sphincter tone, no lesions  *** chaperoned for the exam.  A:  Well Woman with normal exam  P:

## 2021-04-17 ENCOUNTER — Ambulatory Visit: Payer: Managed Care, Other (non HMO) | Admitting: Obstetrics and Gynecology

## 2021-04-22 ENCOUNTER — Other Ambulatory Visit: Payer: Self-pay

## 2021-04-22 ENCOUNTER — Encounter (HOSPITAL_BASED_OUTPATIENT_CLINIC_OR_DEPARTMENT_OTHER): Payer: Self-pay

## 2021-04-22 ENCOUNTER — Ambulatory Visit (HOSPITAL_BASED_OUTPATIENT_CLINIC_OR_DEPARTMENT_OTHER)
Admission: RE | Admit: 2021-04-22 | Discharge: 2021-04-22 | Disposition: A | Discharge status: Home / Self Care | Payer: Managed Care, Other (non HMO) | Source: Ambulatory Visit | Attending: Internal Medicine | Admitting: Internal Medicine

## 2021-04-22 DIAGNOSIS — Z1231 Encounter for screening mammogram for malignant neoplasm of breast: Secondary | ICD-10-CM | POA: Diagnosis present

## 2021-06-05 ENCOUNTER — Ambulatory Visit: Payer: Managed Care, Other (non HMO) | Admitting: Internal Medicine

## 2021-10-05 ENCOUNTER — Other Ambulatory Visit: Payer: Self-pay | Admitting: Internal Medicine

## 2021-10-09 ENCOUNTER — Other Ambulatory Visit: Payer: Self-pay | Admitting: Internal Medicine

## 2021-10-10 NOTE — Telephone Encounter (Signed)
Patient called back and would like both of her prescriptions for 90 days.  Insurance will pay for 90 days and it will cost her less  Her Levothyroxine & her sertaline

## 2021-10-10 NOTE — Telephone Encounter (Signed)
No refills given until appt. MD wanted Return in about 3 months (around 07/03/2021) for a follow-up visit.

## 2021-10-14 ENCOUNTER — Telehealth: Payer: Self-pay | Admitting: Internal Medicine

## 2021-10-14 NOTE — Telephone Encounter (Signed)
Pt called over and states MD sent over citalopram (CELEXA) 40 MG tablet. States she has been taking Sertraline '100MG'$  . States that helps the best and wants to know why something else was called in. Please fill the medication she has been taking.    [id number] [place], [place] - [address] AT [address]:  [phone] Fax:  [phone]

## 2021-10-15 NOTE — Telephone Encounter (Signed)
Sertaline was d/c'd due to interaction w/Meloxicam. Citalopram is ok to take w/Meloxicam. Thx

## 2021-10-16 NOTE — Telephone Encounter (Signed)
PT calls in regards to side effects from citalopram. PT was been getting sick after taking this medication. Emphasizes that she would like to be back on the Sertaline again if possible. Also states she would be okay trying a couple days worth of another medication if the Sertaline was still a problem, but will not be taking Citalopram anymore.   PT also informed me that she is not currently taking the Meloxicam and was not familiar with this medication.  [facility] [address] [facility] point

## 2021-10-17 MED ORDER — SERTRALINE HCL 100 MG PO TABS: ORAL_TABLET | ORAL | 5 refills | Status: DC

## 2021-10-17 NOTE — Telephone Encounter (Signed)
Pt states she is needing a 90 day supply sent in for the Sertraline s it cost more to fill the 30 day supply and her insurance is asking for a 90 day supply to be sent in.

## 2021-10-17 NOTE — Telephone Encounter (Signed)
Okay to discontinue citalopram Restart sertraline Discontinue meloxicam Thanks

## 2021-10-18 MED ORDER — SERTRALINE HCL 100 MG PO TABS: ORAL_TABLET | ORAL | 1 refills | Status: DC

## 2021-10-18 NOTE — Telephone Encounter (Signed)
Okay.  Thanks.

## 2021-10-22 ENCOUNTER — Telehealth: Payer: Self-pay

## 2021-10-22 MED ORDER — SERTRALINE HCL 100 MG PO TABS: ORAL_TABLET | ORAL | 0 refills | Status: DC

## 2021-10-22 NOTE — Telephone Encounter (Signed)
[facility] that was sent on 10/18/2021 - Can [facility] be [facility]  - Patient is completely out of her medication

## 2021-10-22 NOTE — Telephone Encounter (Signed)
Sent 30 day supply =until appt.Marland KitchenJohny Chess

## 2021-10-22 NOTE — Telephone Encounter (Signed)
Pt is calling requesting a short refill on: sertraline (ZOLOFT) 100 MG tablet  I advised the pt that it was noted that she needed to make an appt. After repeating she needed an appt pt did schedule appt for 11/06/21 pt was very hesitate to make appt without a guarantee that she will get medication before appt.  I offered the first available appt and the pt declined that.  Pharmacy: [id number] [place], [place] - [address]  LOV 04/02/21 ROV 11/06/21

## 2021-10-23 NOTE — Telephone Encounter (Signed)
Medication was sent to pts pharmacy yesterday at 3:57pm.

## 2021-11-06 ENCOUNTER — Encounter: Payer: Self-pay | Admitting: Internal Medicine

## 2021-11-06 ENCOUNTER — Ambulatory Visit: Payer: Managed Care, Other (non HMO) | Admitting: Internal Medicine

## 2021-11-06 VITALS — BP 144/90 | HR 100 | Temp 97.7°F | Ht 65.5 in | Wt 311.0 lb

## 2021-11-06 DIAGNOSIS — E034 Atrophy of thyroid (acquired): Secondary | ICD-10-CM

## 2021-11-06 DIAGNOSIS — I10 Essential (primary) hypertension: Secondary | ICD-10-CM

## 2021-11-06 DIAGNOSIS — Z136 Encounter for screening for cardiovascular disorders: Secondary | ICD-10-CM

## 2021-11-06 DIAGNOSIS — Z0000 Encounter for general adult medical examination without abnormal findings: Secondary | ICD-10-CM | POA: Diagnosis not present

## 2021-11-06 DIAGNOSIS — R7309 Other abnormal glucose: Secondary | ICD-10-CM

## 2021-11-06 DIAGNOSIS — R202 Paresthesia of skin: Secondary | ICD-10-CM | POA: Diagnosis not present

## 2021-11-06 DIAGNOSIS — E559 Vitamin D deficiency, unspecified: Secondary | ICD-10-CM

## 2021-11-06 LAB — COMPREHENSIVE METABOLIC PANEL
ALT: 28 U/L (ref 0–35)
AST: 36 U/L (ref 0–37)
Albumin: 4 g/dL (ref 3.5–5.2)
Alkaline Phosphatase: 81 U/L (ref 39–117)
BUN: 8 mg/dL (ref 6–23)
CO2: 31 mEq/L (ref 19–32)
Calcium: 9.7 mg/dL (ref 8.4–10.5)
Chloride: 106 mEq/L (ref 96–112)
Creatinine, Ser: 0.82 mg/dL (ref 0.40–1.20)
GFR: 74.32 mL/min (ref >60.00)
Glucose, Bld: 121 mg/dL — ABNORMAL HIGH (ref 70–99)
Potassium: 4 mEq/L (ref 3.5–5.1)
Sodium: 143 mEq/L (ref 135–145)
Total Bilirubin: 0.6 mg/dL (ref 0.2–1.2)
Total Protein: 7.6 g/dL (ref 6.0–8.3)

## 2021-11-06 LAB — URINALYSIS
Bilirubin Urine: NEGATIVE
Hgb urine dipstick: NEGATIVE
Ketones, ur: NEGATIVE
Leukocytes,Ua: NEGATIVE
Nitrite: NEGATIVE
Specific Gravity, Urine: 1.02 (ref 1.000–1.030)
Total Protein, Urine: NEGATIVE
Urine Glucose: NEGATIVE
Urobilinogen, UA: 0.2 (ref 0.0–1.0)
pH: 6 (ref 5.0–8.0)

## 2021-11-06 LAB — CBC WITH DIFFERENTIAL/PLATELET
Basophils Absolute: 0 10*3/uL (ref 0.0–0.1)
Basophils Relative: 0.5 % (ref 0.0–3.0)
Eosinophils Absolute: 0.3 10*3/uL (ref 0.0–0.7)
Eosinophils Relative: 6.2 % — ABNORMAL HIGH (ref 0.0–5.0)
HCT: 40.7 % (ref 36.0–46.0)
Hemoglobin: 12.9 g/dL (ref 12.0–15.0)
Lymphocytes Relative: 28.4 % (ref 12.0–46.0)
Lymphs Abs: 1.6 10*3/uL (ref 0.7–4.0)
MCHC: 31.8 g/dL (ref 30.0–36.0)
MCV: 92.9 fl (ref 78.0–100.0)
Monocytes Absolute: 0.3 10*3/uL (ref 0.1–1.0)
Monocytes Relative: 6.1 % (ref 3.0–12.0)
Neutro Abs: 3.2 10*3/uL (ref 1.4–7.7)
Neutrophils Relative %: 58.8 % (ref 43.0–77.0)
Platelets: 239 10*3/uL (ref 150.0–400.0)
RBC: 4.38 Mil/uL (ref 3.87–5.11)
RDW: 13.6 % (ref 11.5–15.5)
WBC: 5.5 10*3/uL (ref 4.0–10.5)

## 2021-11-06 LAB — TSH: TSH: 0.88 u[IU]/mL (ref 0.35–5.50)

## 2021-11-06 LAB — LIPID PANEL
Cholesterol: 221 mg/dL — ABNORMAL HIGH (ref 0–200)
HDL: 45.2 mg/dL (ref >39.00)
LDL Cholesterol: 144 mg/dL — ABNORMAL HIGH (ref 0–99)
NonHDL: 176.12
Total CHOL/HDL Ratio: 5
Triglycerides: 161 mg/dL — ABNORMAL HIGH (ref 0.0–149.0)
VLDL: 32.2 mg/dL (ref 0.0–40.0)

## 2021-11-06 LAB — VITAMIN B12: Vitamin B-12: 523 pg/mL (ref 211–911)

## 2021-11-06 LAB — HEMOGLOBIN A1C: Hgb A1c MFr Bld: 5.9 % (ref 4.6–6.5)

## 2021-11-06 MED ORDER — LEVOTHYROXINE SODIUM 150 MCG PO TABS: 150.0000 ug | ORAL_TABLET | Freq: Every day | ORAL | 3 refills | Status: DC

## 2021-11-06 MED ORDER — LOSARTAN POTASSIUM 100 MG PO TABS: ORAL_TABLET | ORAL | 3 refills | Status: DC

## 2021-11-06 MED ORDER — POTASSIUM CHLORIDE ER 10 MEQ PO TBCR
10.0000 meq | EXTENDED_RELEASE_TABLET | Freq: Every day | ORAL | 3 refills | Status: DC
Start: 2021-11-06 — End: 2022-09-23

## 2021-11-06 MED ORDER — SERTRALINE HCL 100 MG PO TABS: ORAL_TABLET | ORAL | 3 refills | Status: DC

## 2021-11-06 MED ORDER — AMLODIPINE BESYLATE 5 MG PO TABS: ORAL_TABLET | ORAL | 3 refills | Status: DC

## 2021-11-06 NOTE — Assessment & Plan Note (Addendum)
BP is better at home Cont on Amlodipine,re-start  Losartan

## 2021-11-06 NOTE — Assessment & Plan Note (Signed)
Cont on Levoxyl

## 2021-11-06 NOTE — Assessment & Plan Note (Signed)
Rybelsus offered  - pt refused

## 2021-11-06 NOTE — Progress Notes (Signed)
Subjective:  Patient ID: Tracy Sampson, female    DOB: 09/25/1955  Age: 66 y.o. MRN: [id number]  CC: Medication Refill   HPI Tracy Sampson presents for HTN, anxiety, hypothyroidism f/u. Not taking Losartan  Outpatient Medications Prior to Visit  Medication Sig Dispense Refill   cholecalciferol (VITAMIN D3) 25 MCG (1000 UNIT) tablet Take 1,000 Units by mouth daily.     Cholecalciferol (VITAMIN D3) 50 MCG (2000 UT) capsule Take 1 capsule (2,000 Units total) by mouth daily. 100 capsule 3   cyclobenzaprine (FLEXERIL) 5 MG tablet Take 1 tablet (5 mg total) by mouth 3 (three) times daily as needed for muscle spasms. 30 tablet 0   dorzolamide-timolol (COSOPT) 22.3-6.8 MG/ML ophthalmic solution Place 1 drop into both eyes 2 (two) times daily.      famotidine (PEPCID) 40 MG tablet Take 1 tablet (40 mg total) by mouth daily. 90 tablet 2   oxybutynin (DITROPAN XL) 15 MG 24 hr tablet Take 1 tablet (15 mg total) by mouth at bedtime. 90 tablet 3   Semaglutide (RYBELSUS) 3 MG TABS 1 po qam 30 tablet 1   vitamin B-12 (CYANOCOBALAMIN) 500 MCG tablet Take 500 mcg by mouth daily.     amLODipine (NORVASC) 5 MG tablet 1 po qd 90 tablet 3   levothyroxine (EUTHYROX) 150 MCG tablet Take 1 tablet (150 mcg total) by mouth daily before breakfast. 90 tablet 0   losartan (COZAAR) 100 MG tablet TAKE 1 TABLET(100 MG) BY MOUTH DAILY 90 tablet 2   potassium chloride (KLOR-CON) 10 MEQ tablet TAKE 1 TABLET BY MOUTH EVERY DAY 90 tablet 3   sertraline (ZOLOFT) 100 MG tablet TAKE 1 TABLET BY MOUTH EVERY DAY Keep appt for 90 day supply 30 tablet 0   No facility-administered medications prior to visit.    ROS: Review of Systems  Constitutional:  Negative for activity change, appetite change, chills, fatigue and unexpected weight change.  HENT:  Negative for congestion, mouth sores and sinus pressure.   Eyes:  Negative for visual disturbance.  Respiratory:  Negative for cough and chest tightness.    Gastrointestinal:  Negative for abdominal pain and nausea.  Genitourinary:  Negative for difficulty urinating, frequency and vaginal pain.  Musculoskeletal:  Negative for back pain and gait problem.  Skin:  Negative for pallor and rash.  Neurological:  Negative for dizziness, tremors, weakness, numbness and headaches.  Psychiatric/Behavioral:  Negative for confusion and sleep disturbance.     Objective:  BP (!) 144/90 (BP Location: Left Arm, Patient Position: Sitting, Cuff Size: Large)   Pulse 100   Temp 97.7 F (36.5 C) (Temporal)   Ht 5' 5.5" (1.664 m)   Wt (!) 311 lb (141.1 kg)   LMP  (LMP Unknown)   SpO2 96%   BMI 50.97 kg/m   BP Readings from Last 3 Encounters:  11/06/21 (!) 144/90  04/02/21 124/82  10/02/20 (!) 172/90    Wt Readings from Last 3 Encounters:  11/06/21 (!) 311 lb (141.1 kg)  04/02/21 (!) 315 lb (142.9 kg)  10/02/20 (!) 318 lb 12.8 oz (144.6 kg)    Physical Exam Constitutional:      General: She is not in acute distress.    Appearance: She is well-developed. She is obese.  HENT:     Head: Normocephalic.     Right Ear: External ear normal.     Left Ear: External ear normal.     Nose: Nose normal.  Eyes:     General:  Right eye: No discharge.        Left eye: No discharge.     Conjunctiva/sclera: Conjunctivae normal.     Pupils: Pupils are equal, round, and reactive to light.  Neck:     Thyroid: No thyromegaly.     Vascular: No JVD.     Trachea: No tracheal deviation.  Cardiovascular:     Rate and Rhythm: Normal rate and regular rhythm.     Heart sounds: Normal heart sounds.  Pulmonary:     Effort: No respiratory distress.     Breath sounds: No stridor. No wheezing.  Abdominal:     General: Bowel sounds are normal. There is no distension.     Palpations: Abdomen is soft. There is no mass.     Tenderness: There is no abdominal tenderness. There is no guarding or rebound.  Musculoskeletal:        General: No tenderness.      Cervical back: Normal range of motion and neck supple. No rigidity.  Lymphadenopathy:     Cervical: No cervical adenopathy.  Skin:    Findings: No erythema or rash.  Neurological:     Cranial Nerves: No cranial nerve deficit.     Motor: No abnormal muscle tone.     Coordination: Coordination normal.     Deep Tendon Reflexes: Reflexes normal.  Psychiatric:        Behavior: Behavior normal.        Thought Content: Thought content normal.        Judgment: Judgment normal.     Lab Results  Component Value Date   WBC 6.9 04/09/2020   HGB 12.9 04/09/2020   HCT 39.8 04/09/2020   PLT 237.0 04/09/2020   GLUCOSE 112 (H) 10/02/2020   CHOL 188 04/09/2020   TRIG 117.0 04/09/2020   HDL 43.60 04/09/2020   LDLDIRECT 149.3 10/23/2010   LDLCALC 121 (H) 04/09/2020   ALT 33 08/28/2020   AST 37 08/28/2020   NA 145 10/02/2020   K 3.4 (L) 10/02/2020   CL 106 10/02/2020   CREATININE 0.81 10/02/2020   BUN 14 10/02/2020   CO2 28 10/02/2020   TSH 3.16 08/28/2020   INR 1.1 (H) 09/05/2019   HGBA1C 5.8 08/28/2020    MM 3D SCREEN BREAST BILATERAL  Result Date: 04/22/2021 CLINICAL DATA:  Screening. EXAM: DIGITAL SCREENING BILATERAL MAMMOGRAM WITH TOMOSYNTHESIS AND CAD TECHNIQUE: Bilateral screening digital craniocaudal and mediolateral oblique mammograms were obtained. Bilateral screening digital breast tomosynthesis was performed. The images were evaluated with computer-aided detection. COMPARISON:  Previous exam(s). ACR Breast Density Category b: There are scattered areas of fibroglandular density. FINDINGS: There are no findings suspicious for malignancy. IMPRESSION: No mammographic evidence of malignancy. A result letter of this screening mammogram will be mailed directly to the patient. RECOMMENDATION: Screening mammogram in one year. (Code:SM-B-01Y) BI-RADS CATEGORY  1: Negative. Electronically Signed   By: Abelardo Diesel M.D.   On: 04/22/2021 11:25    Assessment & Plan:   Problem List Items  Addressed This Visit     Elevated glucose    Rybelsus offered  - pt refused      Relevant Orders   Hemoglobin A1c   Essential hypertension    BP is better at home Cont on Amlodipine,re-start  Losartan      Relevant Medications   amLODipine (NORVASC) 5 MG tablet   losartan (COZAAR) 100 MG tablet   Hypothyroidism    Cont on Levoxyl      Relevant Medications  levothyroxine (EUTHYROX) 150 MCG tablet   OBESITY, MORBID    Rybelsus - pt refused On diet      Paresthesia   Relevant Orders   Vitamin B12   Hemoglobin A1c   Vitamin D deficiency    Cont on Vit D      Well adult exam - Primary   Relevant Orders   TSH   Urinalysis   CBC with Differential/Platelet   Lipid panel   Comprehensive metabolic panel   Vitamin C58   Hemoglobin A1c      Meds ordered this encounter  Medications   amLODipine (NORVASC) 5 MG tablet    Sig: 1 po qd    Dispense:  90 tablet    Refill:  3   levothyroxine (EUTHYROX) 150 MCG tablet    Sig: Take 1 tablet (150 mcg total) by mouth daily before breakfast.    Dispense:  90 tablet    Refill:  3    No refills given until appt. MD wanted Return in about 3 months (around 07/03/2021) for a follow-up visit.   losartan (COZAAR) 100 MG tablet    Sig: TAKE 1 TABLET(100 MG) BY MOUTH DAILY    Dispense:  90 tablet    Refill:  3   sertraline (ZOLOFT) 100 MG tablet    Sig: TAKE 1 TABLET BY MOUTH EVERY DAY Keep appt for 90 day supply    Dispense:  90 tablet    Refill:  3    Keep appt for 90 day supply   potassium chloride (KLOR-CON) 10 MEQ tablet    Sig: Take 1 tablet (10 mEq total) by mouth daily.    Dispense:  90 tablet    Refill:  3      Follow-up: Return in about 3 months (around 02/06/2022) for Wellness Exam.  Walker Kehr, MD

## 2021-11-06 NOTE — Assessment & Plan Note (Signed)
Cont on Vit D 

## 2021-11-06 NOTE — Assessment & Plan Note (Signed)
Rybelsus - pt refused On diet

## 2021-11-20 ENCOUNTER — Telehealth: Payer: Self-pay | Admitting: Internal Medicine

## 2021-11-20 NOTE — Telephone Encounter (Signed)
Pt called to get lab results from 11/06/21. Advised pt "Please inform the patient that all labs are stable."   [facility]

## 2022-02-10 ENCOUNTER — Encounter: Payer: Self-pay | Admitting: Internal Medicine

## 2022-02-10 ENCOUNTER — Ambulatory Visit: Payer: Managed Care, Other (non HMO) | Admitting: Internal Medicine

## 2022-02-10 VITALS — BP 145/82 | HR 72 | Temp 98.2°F | Ht 65.5 in | Wt 312.4 lb

## 2022-02-10 DIAGNOSIS — R7309 Other abnormal glucose: Secondary | ICD-10-CM

## 2022-02-10 DIAGNOSIS — I10 Essential (primary) hypertension: Secondary | ICD-10-CM

## 2022-02-10 DIAGNOSIS — E034 Atrophy of thyroid (acquired): Secondary | ICD-10-CM

## 2022-02-10 DIAGNOSIS — L308 Other specified dermatitis: Secondary | ICD-10-CM

## 2022-02-10 DIAGNOSIS — L309 Dermatitis, unspecified: Secondary | ICD-10-CM | POA: Insufficient documentation

## 2022-02-10 MED ORDER — TRIAMCINOLONE ACETONIDE 0.1 % EX CREA: 1.0000 | TOPICAL_CREAM | Freq: Three times a day (TID) | CUTANEOUS | 3 refills | Status: DC

## 2022-02-10 NOTE — Progress Notes (Signed)
Subjective:  Patient ID: Tracy Sampson, female    DOB: 25-Jul-1955  Age: 66 y.o. MRN: [id number]  CC: Follow-up (3 month f/u)   HPI Tracy Sampson presents for HTN, hypothyroidism, obesity C/o rash on the back - chronic, itchy   Outpatient Medications Prior to Visit  Medication Sig Dispense Refill   amLODipine (NORVASC) 5 MG tablet 1 po qd 90 tablet 3   cholecalciferol (VITAMIN D3) 25 MCG (1000 UNIT) tablet Take 1,000 Units by mouth daily.     Cholecalciferol (VITAMIN D3) 50 MCG (2000 UT) capsule Take 1 capsule (2,000 Units total) by mouth daily. 100 capsule 3   cyclobenzaprine (FLEXERIL) 5 MG tablet Take 1 tablet (5 mg total) by mouth 3 (three) times daily as needed for muscle spasms. 30 tablet 0   dorzolamide-timolol (COSOPT) 22.3-6.8 MG/ML ophthalmic solution Place 1 drop into both eyes 2 (two) times daily.      famotidine (PEPCID) 40 MG tablet Take 1 tablet (40 mg total) by mouth daily. 90 tablet 2   levothyroxine (EUTHYROX) 150 MCG tablet Take 1 tablet (150 mcg total) by mouth daily before breakfast. 90 tablet 3   losartan (COZAAR) 100 MG tablet TAKE 1 TABLET(100 MG) BY MOUTH DAILY 90 tablet 3   oxybutynin (DITROPAN XL) 15 MG 24 hr tablet Take 1 tablet (15 mg total) by mouth at bedtime. 90 tablet 3   potassium chloride (KLOR-CON) 10 MEQ tablet Take 1 tablet (10 mEq total) by mouth daily. 90 tablet 3   sertraline (ZOLOFT) 100 MG tablet TAKE 1 TABLET BY MOUTH EVERY DAY Keep appt for 90 day supply 90 tablet 3   vitamin B-12 (CYANOCOBALAMIN) 500 MCG tablet Take 500 mcg by mouth daily.     Semaglutide (RYBELSUS) 3 MG TABS 1 po qam 30 tablet 1   No facility-administered medications prior to visit.    ROS: Review of Systems  Constitutional:  Positive for unexpected weight change. Negative for activity change, appetite change, chills and fatigue.  HENT:  Negative for congestion, mouth sores and sinus pressure.   Eyes:  Negative for visual disturbance.  Respiratory:  Negative  for cough and chest tightness.   Gastrointestinal:  Negative for abdominal pain and nausea.  Genitourinary:  Negative for difficulty urinating, frequency and vaginal pain.  Musculoskeletal:  Negative for back pain and gait problem.  Skin:  Negative for pallor and rash.  Neurological:  Negative for dizziness, tremors, weakness, numbness and headaches.  Psychiatric/Behavioral:  Negative for confusion and sleep disturbance.     Objective:  BP (!) 145/82   Pulse 72   Temp 98.2 F (36.8 C) (Oral)   Ht 5' 5.5" (1.664 m)   Wt (!) 312 lb 6.4 oz (141.7 kg)   LMP  (LMP Unknown)   SpO2 95%   BMI 51.20 kg/m   BP Readings from Last 3 Encounters:  02/10/22 (!) 145/82  11/06/21 (!) 144/90  04/02/21 124/82    Wt Readings from Last 3 Encounters:  02/10/22 (!) 312 lb 6.4 oz (141.7 kg)  11/06/21 (!) 311 lb (141.1 kg)  04/02/21 (!) 315 lb (142.9 kg)    Physical Exam Constitutional:      General: She is not in acute distress.    Appearance: She is well-developed. She is obese.  HENT:     Head: Normocephalic.     Right Ear: External ear normal.     Left Ear: External ear normal.     Nose: Nose normal.  Eyes:  General:        Right eye: No discharge.        Left eye: No discharge.     Conjunctiva/sclera: Conjunctivae normal.     Pupils: Pupils are equal, round, and reactive to light.  Neck:     Thyroid: No thyromegaly.     Vascular: No JVD.     Trachea: No tracheal deviation.  Cardiovascular:     Rate and Rhythm: Normal rate and regular rhythm.     Heart sounds: Normal heart sounds.  Pulmonary:     Effort: No respiratory distress.     Breath sounds: No stridor. No wheezing.  Abdominal:     General: Bowel sounds are normal. There is no distension.     Palpations: Abdomen is soft. There is no mass.     Tenderness: There is no abdominal tenderness. There is no guarding or rebound.  Musculoskeletal:        General: No tenderness.     Cervical back: Normal range of motion and  neck supple. No rigidity.  Lymphadenopathy:     Cervical: No cervical adenopathy.  Skin:    Findings: No erythema or rash.  Neurological:     Cranial Nerves: No cranial nerve deficit.     Motor: No abnormal muscle tone.     Coordination: Coordination normal.     Deep Tendon Reflexes: Reflexes normal.  Psychiatric:        Behavior: Behavior normal.        Thought Content: Thought content normal.        Judgment: Judgment normal.   A patch of eczema 5x7 cm on sacrum area  Lab Results  Component Value Date   WBC 5.5 11/06/2021   HGB 12.9 11/06/2021   HCT 40.7 11/06/2021   PLT 239.0 11/06/2021   GLUCOSE 121 (H) 11/06/2021   CHOL 221 (H) 11/06/2021   TRIG 161.0 (H) 11/06/2021   HDL 45.20 11/06/2021   LDLDIRECT 149.3 10/23/2010   LDLCALC 144 (H) 11/06/2021   ALT 28 11/06/2021   AST 36 11/06/2021   NA 143 11/06/2021   K 4.0 11/06/2021   CL 106 11/06/2021   CREATININE 0.82 11/06/2021   BUN 8 11/06/2021   CO2 31 11/06/2021   TSH 0.88 11/06/2021   INR 1.1 (H) 09/05/2019   HGBA1C 5.9 11/06/2021    MM 3D SCREEN BREAST BILATERAL  Result Date: 04/22/2021 CLINICAL DATA:  Screening. EXAM: DIGITAL SCREENING BILATERAL MAMMOGRAM WITH TOMOSYNTHESIS AND CAD TECHNIQUE: Bilateral screening digital craniocaudal and mediolateral oblique mammograms were obtained. Bilateral screening digital breast tomosynthesis was performed. The images were evaluated with computer-aided detection. COMPARISON:  Previous exam(s). ACR Breast Density Category b: There are scattered areas of fibroglandular density. FINDINGS: There are no findings suspicious for malignancy. IMPRESSION: No mammographic evidence of malignancy. A result letter of this screening mammogram will be mailed directly to the patient. RECOMMENDATION: Screening mammogram in one year. (Code:SM-B-01Y) BI-RADS CATEGORY  1: Negative. Electronically Signed   By: Abelardo Diesel M.D.   On: 04/22/2021 11:25    Assessment & Plan:   Problem List Items  Addressed This Visit     Eczema    A patch of eczema 5x7 cm on sacrum area       Elevated glucose - Primary   Relevant Orders   Comprehensive metabolic panel   Hemoglobin A1c   Essential hypertension    Cont on Amlodipine, Losartan  She declined diuretics.  NAS diet      Relevant Orders  Comprehensive metabolic panel   Hemoglobin A1c   Hypothyroidism    Chronic Cont on Levoxyl      OBESITY, MORBID    Wt Readings from Last 3 Encounters:  02/10/22 (!) 312 lb 6.4 oz (141.7 kg)  11/06/21 (!) 311 lb (141.1 kg)  04/02/21 (!) 315 lb (142.9 kg)           Meds ordered this encounter  Medications   triamcinolone cream (KENALOG) 0.1 %    Sig: Apply 1 Application topically 3 (three) times daily.    Dispense:  80 g    Refill:  3      Follow-up: Return in about 4 months (around 06/12/2022) for a follow-up visit.  Walker Kehr, MD

## 2022-02-10 NOTE — Assessment & Plan Note (Signed)
A patch of eczema 5x7 cm on sacrum area

## 2022-02-10 NOTE — Assessment & Plan Note (Signed)
Chronic Cont on Levoxyl

## 2022-02-10 NOTE — Assessment & Plan Note (Signed)
Wt Readings from Last 3 Encounters:  02/10/22 (!) 312 lb 6.4 oz (141.7 kg)  11/06/21 (!) 311 lb (141.1 kg)  04/02/21 (!) 315 lb (142.9 kg)

## 2022-02-10 NOTE — Assessment & Plan Note (Signed)
Cont on Amlodipine, Losartan  She declined diuretics.  NAS diet

## 2022-04-24 ENCOUNTER — Other Ambulatory Visit (HOSPITAL_BASED_OUTPATIENT_CLINIC_OR_DEPARTMENT_OTHER): Payer: Self-pay | Admitting: Internal Medicine

## 2022-04-24 DIAGNOSIS — Z1231 Encounter for screening mammogram for malignant neoplasm of breast: Secondary | ICD-10-CM

## 2022-05-06 ENCOUNTER — Encounter (HOSPITAL_BASED_OUTPATIENT_CLINIC_OR_DEPARTMENT_OTHER): Payer: Self-pay

## 2022-05-06 ENCOUNTER — Ambulatory Visit (HOSPITAL_BASED_OUTPATIENT_CLINIC_OR_DEPARTMENT_OTHER)
Admission: RE | Admit: 2022-05-06 | Discharge: 2022-05-06 | Disposition: A | Discharge status: Home / Self Care | Payer: Managed Care, Other (non HMO) | Source: Ambulatory Visit | Attending: Internal Medicine | Admitting: Internal Medicine

## 2022-05-06 DIAGNOSIS — Z1231 Encounter for screening mammogram for malignant neoplasm of breast: Secondary | ICD-10-CM | POA: Insufficient documentation

## 2022-08-13 IMAGING — MG MM DIGITAL SCREENING BILAT W/ TOMO AND CAD
8 of 15 series · 8 of 40 positions shown · non-contrast
Comparison: Previous exam(s).

CLINICAL DATA: Screening.

EXAM:
DIGITAL SCREENING BILATERAL MAMMOGRAM WITH TOMOSYNTHESIS AND CAD
TECHNIQUE: Bilateral screening digital craniocaudal and mediolateral oblique
mammograms were obtained. Bilateral screening digital breast
tomosynthesis was performed. The images were evaluated with
computer-aided detection.

[R MLO synth-2D (1 of 2)]
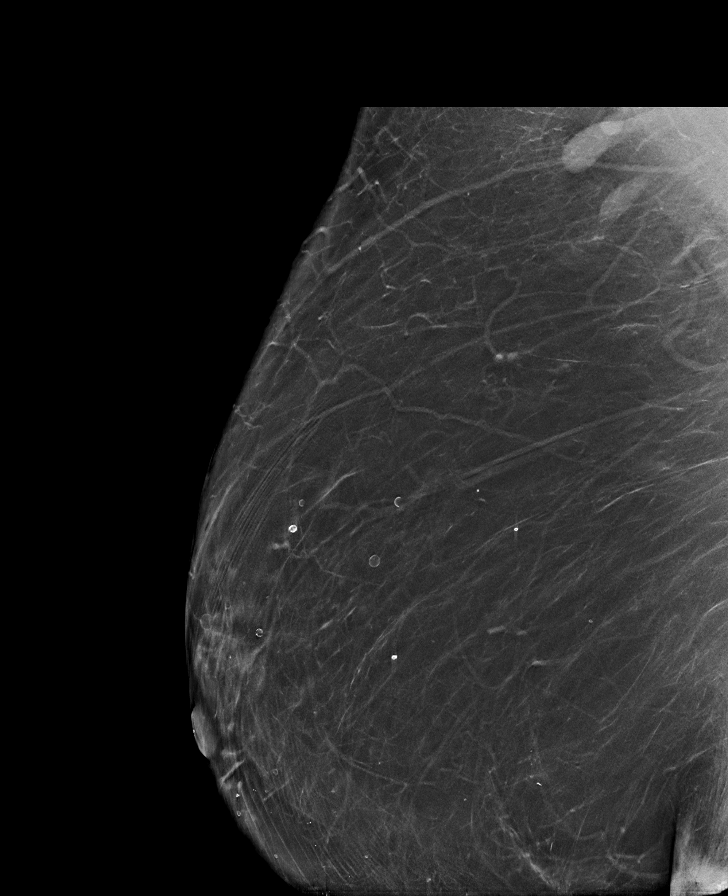

[R CC synth-2D (1 of 2)]
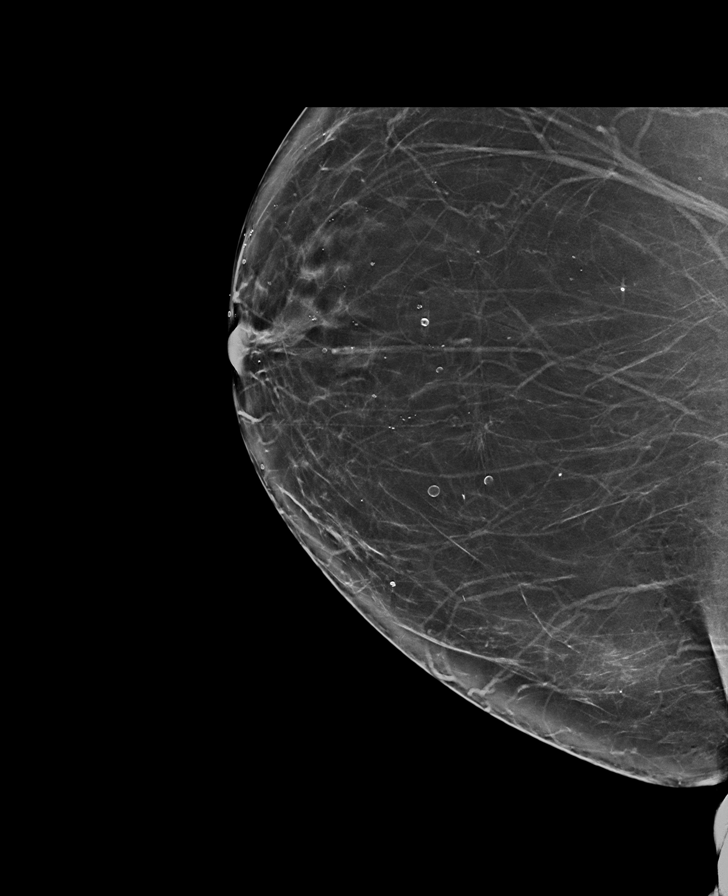

[R MLO synth-2D (2 of 2)]
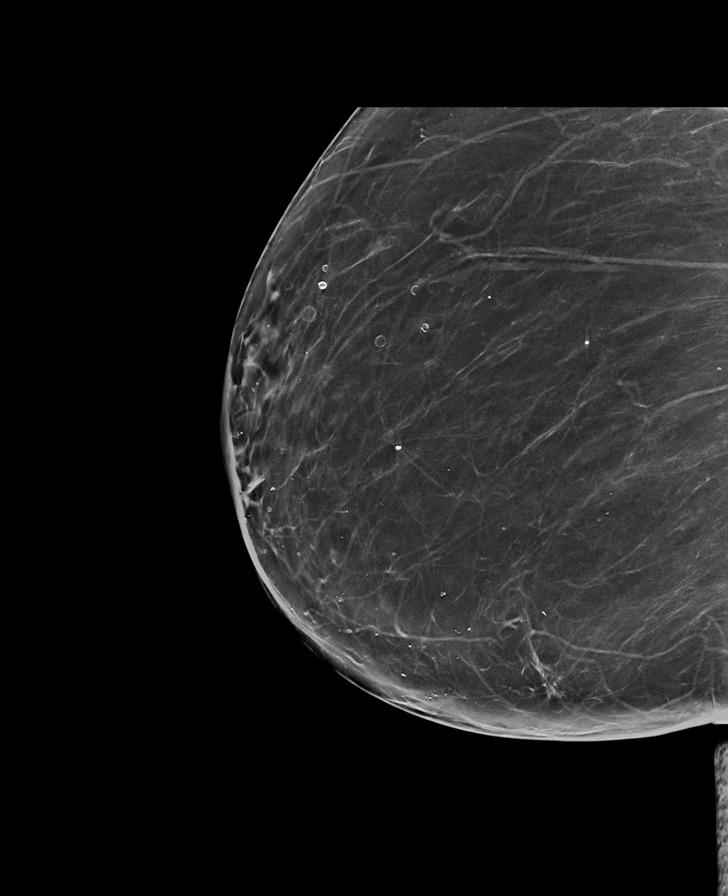

[R CC synth-2D (2 of 2)]
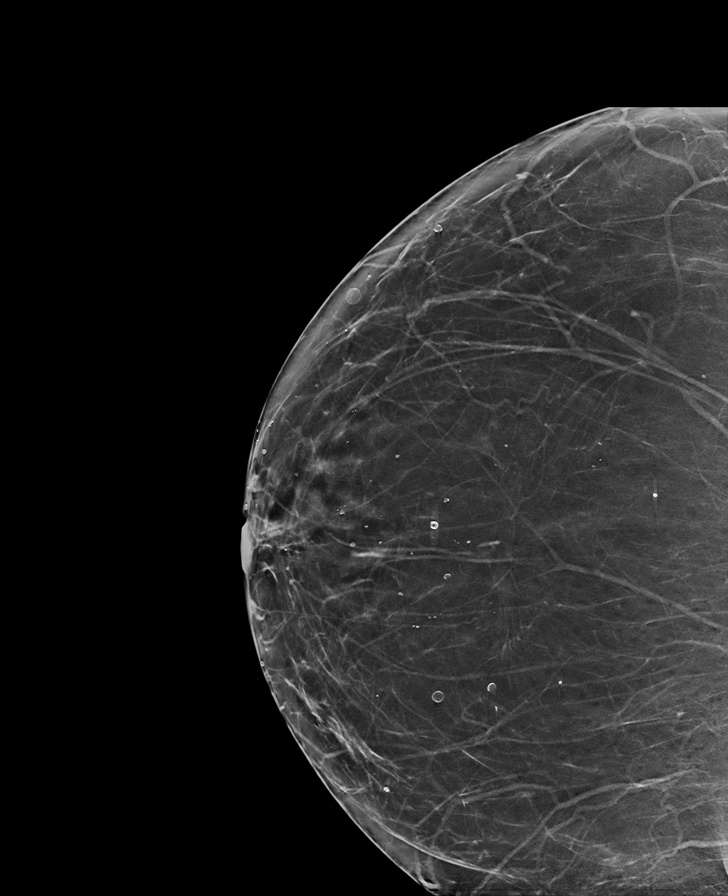

[L MLO synth-2D (1 of 2)]
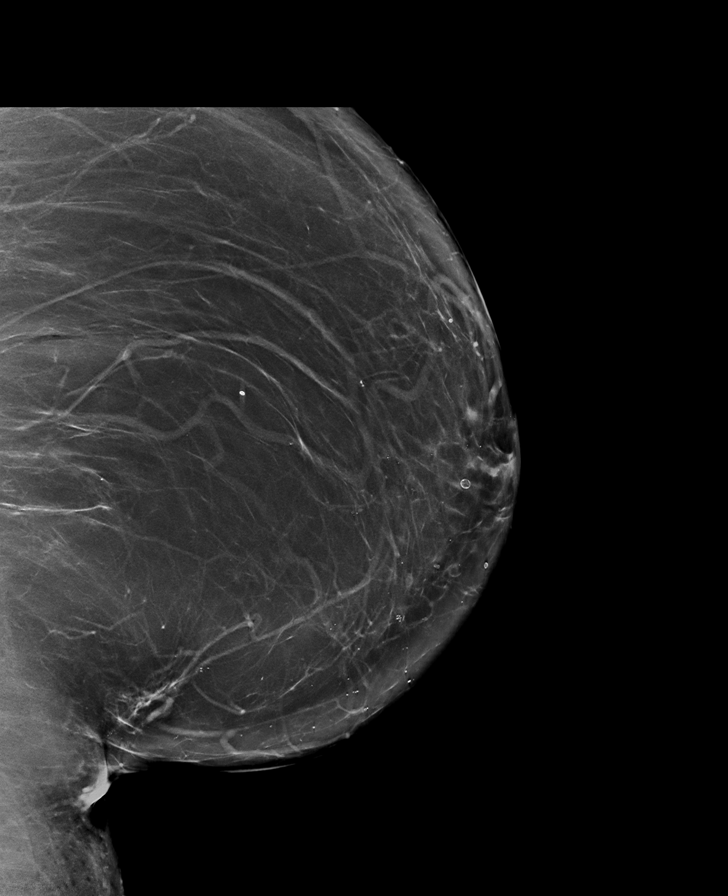

[L MLO synth-2D (2 of 2)]
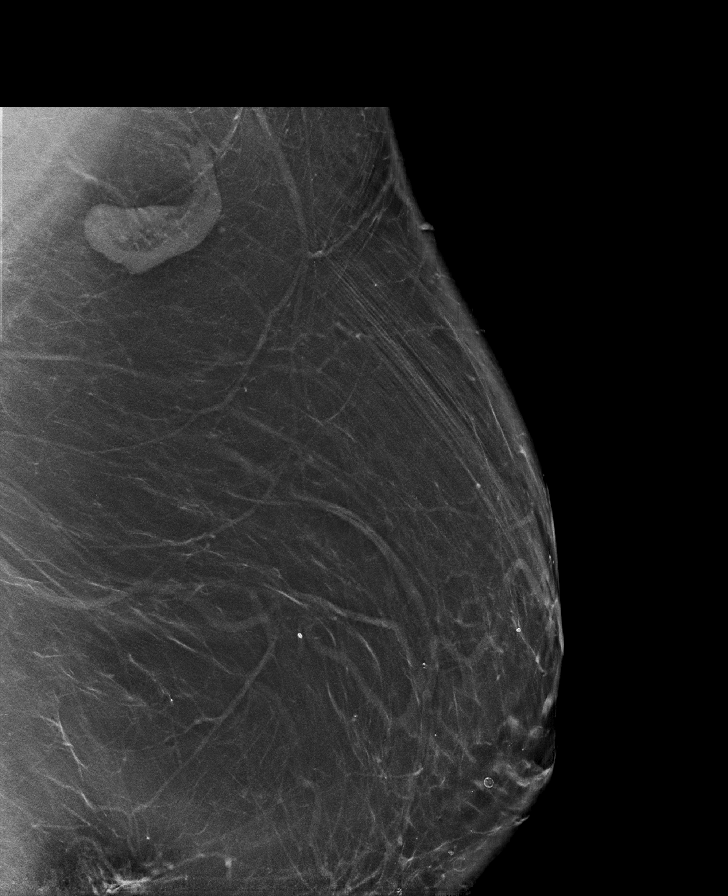

[L CC synth-2D]
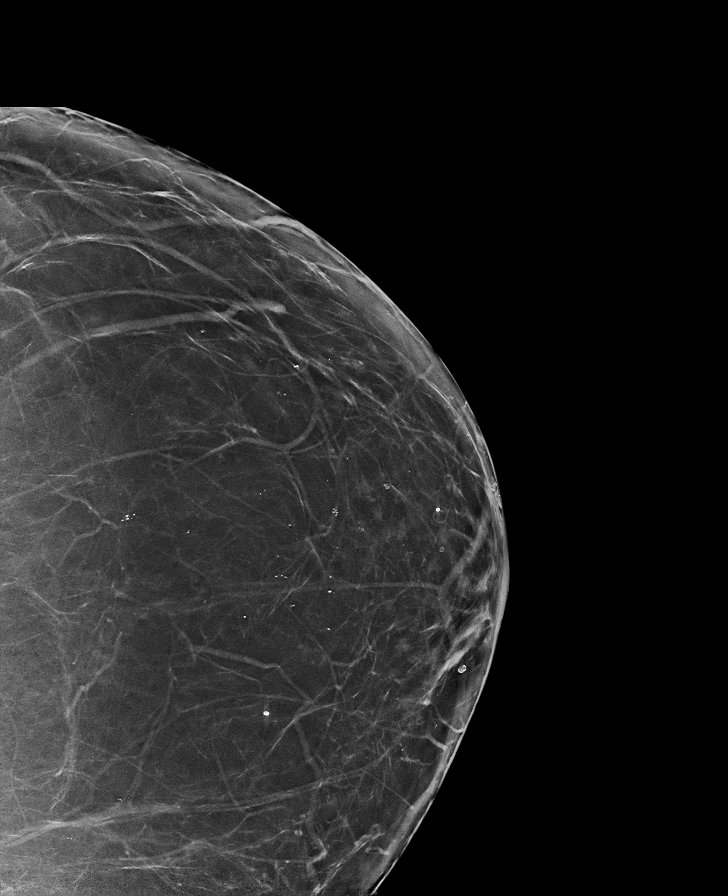

[R CC tomo · tomo slice 49/72.0]
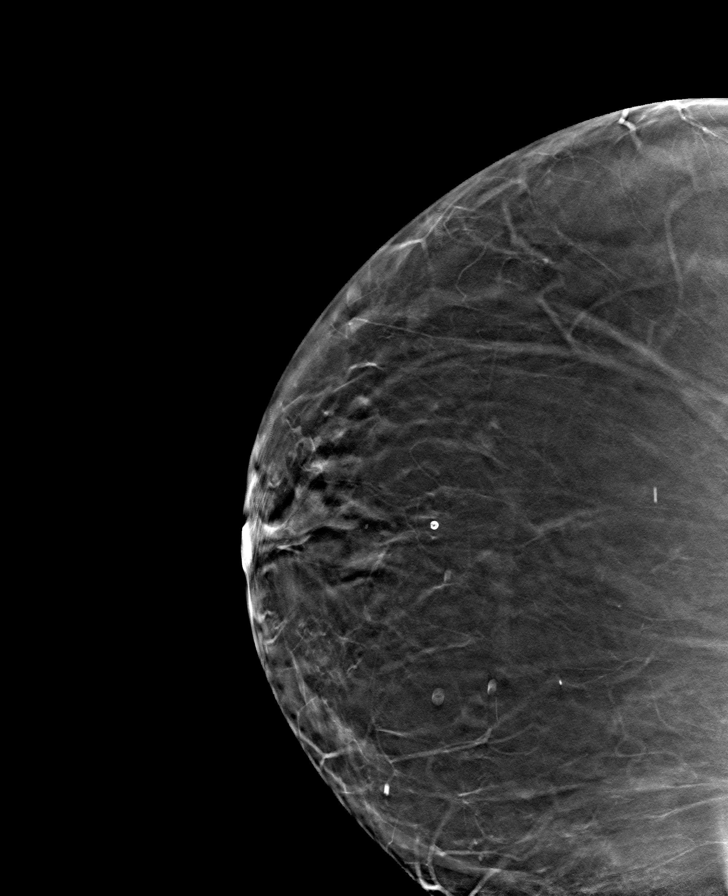

[8 of 40 positions shown; findings below may reference images not displayed]

ACR Breast Density Category b: There are scattered areas of
fibroglandular density.
FINDINGS: There are no findings suspicious for malignancy.
IMPRESSION: No mammographic evidence of malignancy. A result letter of this
screening mammogram will be mailed directly to the patient.

RECOMMENDATION:
Screening mammogram in one year. (Code:51-O-LD2)

BI-RADS CATEGORY  1: Negative.

## 2022-09-04 ENCOUNTER — Other Ambulatory Visit: Payer: Self-pay | Admitting: Internal Medicine

## 2022-09-17 ENCOUNTER — Ambulatory Visit: Payer: Managed Care, Other (non HMO) | Admitting: Internal Medicine

## 2022-09-23 ENCOUNTER — Encounter: Payer: Self-pay | Admitting: Internal Medicine

## 2022-09-23 ENCOUNTER — Ambulatory Visit (INDEPENDENT_AMBULATORY_CARE_PROVIDER_SITE_OTHER): Payer: 59 | Admitting: Internal Medicine

## 2022-09-23 VITALS — BP 118/80 | HR 82 | Temp 97.6°F | Ht 65.5 in | Wt 304.0 lb

## 2022-09-23 DIAGNOSIS — N3281 Overactive bladder: Secondary | ICD-10-CM

## 2022-09-23 DIAGNOSIS — K589 Irritable bowel syndrome without diarrhea: Secondary | ICD-10-CM | POA: Insufficient documentation

## 2022-09-23 DIAGNOSIS — R7309 Other abnormal glucose: Secondary | ICD-10-CM | POA: Diagnosis not present

## 2022-09-23 DIAGNOSIS — R7301 Impaired fasting glucose: Secondary | ICD-10-CM | POA: Diagnosis not present

## 2022-09-23 DIAGNOSIS — E559 Vitamin D deficiency, unspecified: Secondary | ICD-10-CM | POA: Diagnosis not present

## 2022-09-23 DIAGNOSIS — K219 Gastro-esophageal reflux disease without esophagitis: Secondary | ICD-10-CM

## 2022-09-23 DIAGNOSIS — I10 Essential (primary) hypertension: Secondary | ICD-10-CM | POA: Diagnosis not present

## 2022-09-23 DIAGNOSIS — K580 Irritable bowel syndrome with diarrhea: Secondary | ICD-10-CM | POA: Diagnosis not present

## 2022-09-23 LAB — COMPREHENSIVE METABOLIC PANEL
ALT: 17 U/L (ref 0–35)
AST: 20 U/L (ref 0–37)
Albumin: 4.1 g/dL (ref 3.5–5.2)
Alkaline Phosphatase: 76 U/L (ref 39–117)
BUN: 19 mg/dL (ref 6–23)
CO2: 29 mEq/L (ref 19–32)
Calcium: 10 mg/dL (ref 8.4–10.5)
Chloride: 105 mEq/L (ref 96–112)
Creatinine, Ser: 0.87 mg/dL (ref 0.40–1.20)
GFR: 68.8 mL/min (ref >60.00)
Glucose, Bld: 109 mg/dL — ABNORMAL HIGH (ref 70–99)
Potassium: 4.4 mEq/L (ref 3.5–5.1)
Sodium: 143 mEq/L (ref 135–145)
Total Bilirubin: 0.5 mg/dL (ref 0.2–1.2)
Total Protein: 7.6 g/dL (ref 6.0–8.3)

## 2022-09-23 LAB — HEMOGLOBIN A1C: Hgb A1c MFr Bld: 5.7 % (ref 4.6–6.5)

## 2022-09-23 MED ORDER — POTASSIUM CHLORIDE ER 10 MEQ PO TBCR: 10.0000 meq | EXTENDED_RELEASE_TABLET | Freq: Every day | ORAL | 3 refills | Status: DC

## 2022-09-23 MED ORDER — SERTRALINE HCL 100 MG PO TABS: ORAL_TABLET | ORAL | 3 refills | Status: DC

## 2022-09-23 MED ORDER — FAMOTIDINE 40 MG PO TABS: 40.0000 mg | ORAL_TABLET | Freq: Every day | ORAL | 2 refills | Status: AC

## 2022-09-23 MED ORDER — DIPHENOXYLATE-ATROPINE 2.5-0.025 MG PO TABS: 1.0000 | ORAL_TABLET | Freq: Four times a day (QID) | ORAL | 1 refills | Status: AC | PRN

## 2022-09-23 MED ORDER — AMLODIPINE BESYLATE 5 MG PO TABS: ORAL_TABLET | ORAL | 3 refills | Status: DC

## 2022-09-23 MED ORDER — LEVOTHYROXINE SODIUM 150 MCG PO TABS: 150.0000 ug | ORAL_TABLET | Freq: Every day | ORAL | 3 refills | Status: DC

## 2022-09-23 MED ORDER — OXYBUTYNIN CHLORIDE 5 MG PO TABS: 5.0000 mg | ORAL_TABLET | Freq: Three times a day (TID) | ORAL | 5 refills | Status: DC | PRN

## 2022-09-23 NOTE — Assessment & Plan Note (Signed)
Re-start Pepcid 

## 2022-09-23 NOTE — Progress Notes (Signed)
Subjective:  Patient ID: Tracy Sampson, female    DOB: Sep 14, 1955  Age: 67 y.o. MRN: [id number]  CC: Knee Pain (Pain in knees and under lt breast)   HPI [name] presents for OAB, IBS-d F/u HTN, depression  Outpatient Medications Prior to Visit  Medication Sig Dispense Refill   cholecalciferol (VITAMIN D3) 25 MCG (1000 UNIT) tablet Take 1,000 Units by mouth daily.     Cholecalciferol (VITAMIN D3) 50 MCG (2000 UT) capsule Take 1 capsule (2,000 Units total) by mouth daily. 100 capsule 3   cyclobenzaprine (FLEXERIL) 5 MG tablet Take 1 tablet (5 mg total) by mouth 3 (three) times daily as needed for muscle spasms. 30 tablet 0   dorzolamide-timolol (COSOPT) 22.3-6.8 MG/ML ophthalmic solution Place 1 drop into both eyes 2 (two) times daily.      losartan (COZAAR) 100 MG tablet TAKE 1 TABLET(100 MG) BY MOUTH DAILY 90 tablet 3   triamcinolone cream (KENALOG) 0.1 % Apply 1 Application topically 3 (three) times daily. 80 g 3   vitamin B-12 (CYANOCOBALAMIN) 500 MCG tablet Take 500 mcg by mouth daily.     amLODipine (NORVASC) 5 MG tablet 1 po qd 90 tablet 3   famotidine (PEPCID) 40 MG tablet Take 1 tablet (40 mg total) by mouth daily. 90 tablet 2   levothyroxine (EUTHYROX) 150 MCG tablet Take 1 tablet (150 mcg total) by mouth daily before breakfast. 90 tablet 3   oxybutynin (DITROPAN XL) 15 MG 24 hr tablet Take 1 tablet (15 mg total) by mouth at bedtime. 90 tablet 3   potassium chloride (KLOR-CON) 10 MEQ tablet Take 1 tablet (10 mEq total) by mouth daily. 90 tablet 3   sertraline (ZOLOFT) 100 MG tablet TAKE 1 TABLET BY MOUTH EVERY DAY Annual appt due in June must see provider for future refills 90 tablet 0   No facility-administered medications prior to visit.    ROS: Review of Systems  Constitutional:  Negative for activity change, appetite change, chills, fatigue and unexpected weight change.  HENT:  Negative for congestion, mouth sores and sinus pressure.   Eyes:  Negative for  visual disturbance.  Respiratory:  Negative for cough and chest tightness.   Gastrointestinal:  Positive for diarrhea. Negative for abdominal pain and nausea.  Genitourinary:  Positive for enuresis, frequency and urgency. Negative for difficulty urinating and vaginal pain.  Musculoskeletal:  Negative for back pain and gait problem.  Skin:  Negative for pallor and rash.  Neurological:  Negative for dizziness, tremors, weakness, numbness and headaches.  Psychiatric/Behavioral:  Negative for confusion, sleep disturbance and suicidal ideas. The patient is nervous/anxious.     Objective:  BP 118/80 (BP Location: Left Arm, Patient Position: Sitting, Cuff Size: Large)   Pulse 82   Temp 97.6 F (36.4 C) (Oral)   Ht 5' 5.5" (1.664 m)   Wt (!) 304 lb (137.9 kg)   LMP  (LMP Unknown)   SpO2 96%   BMI 49.82 kg/m   BP Readings from Last 3 Encounters:  09/23/22 118/80  02/10/22 (!) 145/82  11/06/21 (!) 144/90    Wt Readings from Last 3 Encounters:  09/23/22 (!) 304 lb (137.9 kg)  02/10/22 (!) 312 lb 6.4 oz (141.7 kg)  11/06/21 (!) 311 lb (141.1 kg)    Physical Exam Constitutional:      General: She is not in acute distress.    Appearance: She is well-developed. She is obese.  HENT:     Head: Normocephalic.  Right Ear: External ear normal.     Left Ear: External ear normal.     Nose: Nose normal.  Eyes:     General:        Right eye: No discharge.        Left eye: No discharge.     Conjunctiva/sclera: Conjunctivae normal.     Pupils: Pupils are equal, round, and reactive to light.  Neck:     Thyroid: No thyromegaly.     Vascular: No JVD.     Trachea: No tracheal deviation.  Cardiovascular:     Rate and Rhythm: Normal rate and regular rhythm.     Heart sounds: Normal heart sounds.  Pulmonary:     Effort: No respiratory distress.     Breath sounds: No stridor. No wheezing.  Abdominal:     General: Bowel sounds are normal. There is no distension.     Palpations: Abdomen  is soft. There is no mass.     Tenderness: There is no abdominal tenderness. There is no guarding or rebound.  Musculoskeletal:        General: No tenderness.     Cervical back: Normal range of motion and neck supple. No rigidity.  Lymphadenopathy:     Cervical: No cervical adenopathy.  Skin:    Findings: No erythema or rash.  Neurological:     Cranial Nerves: No cranial nerve deficit.     Motor: No abnormal muscle tone.     Coordination: Coordination normal.     Deep Tendon Reflexes: Reflexes normal.  Psychiatric:        Behavior: Behavior normal.        Thought Content: Thought content normal.        Judgment: Judgment normal.     Lab Results  Component Value Date   WBC 5.5 11/06/2021   HGB 12.9 11/06/2021   HCT 40.7 11/06/2021   PLT 239.0 11/06/2021   GLUCOSE 121 (H) 11/06/2021   CHOL 221 (H) 11/06/2021   TRIG 161.0 (H) 11/06/2021   HDL 45.20 11/06/2021   LDLDIRECT 149.3 10/23/2010   LDLCALC 144 (H) 11/06/2021   ALT 28 11/06/2021   AST 36 11/06/2021   NA 143 11/06/2021   K 4.0 11/06/2021   CL 106 11/06/2021   CREATININE 0.82 11/06/2021   BUN 8 11/06/2021   CO2 31 11/06/2021   TSH 0.88 11/06/2021   INR 1.1 (H) 09/05/2019   HGBA1C 5.9 11/06/2021    MM 3D SCREEN BREAST BILATERAL  Result Date: 05/06/2022 CLINICAL DATA:  Screening. EXAM: DIGITAL SCREENING BILATERAL MAMMOGRAM WITH TOMOSYNTHESIS AND CAD TECHNIQUE: Bilateral screening digital craniocaudal and mediolateral oblique mammograms were obtained. Bilateral screening digital breast tomosynthesis was performed. The images were evaluated with computer-aided detection. COMPARISON:  Previous exam(s). ACR Breast Density Category b: There are scattered areas of fibroglandular density. FINDINGS: There are no findings suspicious for malignancy. IMPRESSION: No mammographic evidence of malignancy. A result letter of this screening mammogram will be mailed directly to the patient. RECOMMENDATION: Screening mammogram in one  year. (Code:SM-B-01Y) BI-RADS CATEGORY  1: Negative. Electronically Signed   By: Gerome Sam III M.D.   On: 05/06/2022 17:51    Assessment & Plan:   Problem List Items Addressed This Visit     Vitamin D deficiency    On Vit D      GERD (gastroesophageal reflux disease)    Re-start Pepcid      Relevant Medications   diphenoxylate-atropine (LOMOTIL) 2.5-0.025 MG tablet   famotidine (PEPCID)  40 MG tablet   OAB (overactive bladder) - Primary    Try Ditropan prn  Potential benefits of a long term Ditropan use as well as potential risks  and complications were explained to the patient and were aknowledged.       Elevated fasting glucose    On diet      IBS (irritable bowel syndrome)    IBS d Start Lomotil prn      Relevant Medications   diphenoxylate-atropine (LOMOTIL) 2.5-0.025 MG tablet   famotidine (PEPCID) 40 MG tablet      Meds ordered this encounter  Medications   levothyroxine (EUTHYROX) 150 MCG tablet    Sig: Take 1 tablet (150 mcg total) by mouth daily before breakfast.    Dispense:  90 tablet    Refill:  3   amLODipine (NORVASC) 5 MG tablet    Sig: 1 po qd    Dispense:  90 tablet    Refill:  3   potassium chloride (KLOR-CON) 10 MEQ tablet    Sig: Take 1 tablet (10 mEq total) by mouth daily.    Dispense:  90 tablet    Refill:  3   oxybutynin (DITROPAN) 5 MG tablet    Sig: Take 1-2 tablets (5-10 mg total) by mouth 3 (three) times daily as needed for bladder spasms.    Dispense:  90 tablet    Refill:  5   diphenoxylate-atropine (LOMOTIL) 2.5-0.025 MG tablet    Sig: Take 1-2 tablets by mouth 4 (four) times daily as needed for diarrhea or loose stools.    Dispense:  60 tablet    Refill:  1   sertraline (ZOLOFT) 100 MG tablet    Sig: TAKE 1.5 TABLET BY MOUTH EVERY DAY    Dispense:  135 tablet    Refill:  3   famotidine (PEPCID) 40 MG tablet    Sig: Take 1 tablet (40 mg total) by mouth daily.    Dispense:  90 tablet    Refill:  2       Follow-up: Return in about 3 months (around 12/24/2022) for a follow-up visit.  Sonda Primes, MD

## 2022-09-23 NOTE — Assessment & Plan Note (Signed)
IBS d Start Lomotil prn

## 2022-09-23 NOTE — Assessment & Plan Note (Signed)
On Vit D 

## 2022-09-23 NOTE — Patient Instructions (Addendum)
Try GLYTONE exfoliating body lotion or Amlactin

## 2022-09-23 NOTE — Assessment & Plan Note (Signed)
Try Ditropan prn  Potential benefits of a long term Ditropan use as well as potential risks  and complications were explained to the patient and were aknowledged.

## 2022-09-23 NOTE — Assessment & Plan Note (Signed)
  On diet  

## 2022-12-24 ENCOUNTER — Ambulatory Visit: Payer: Self-pay | Admitting: Internal Medicine

## 2023-01-12 ENCOUNTER — Other Ambulatory Visit: Payer: Self-pay | Admitting: Internal Medicine

## 2023-01-14 ENCOUNTER — Other Ambulatory Visit: Payer: Self-pay | Admitting: Internal Medicine

## 2023-01-15 ENCOUNTER — Other Ambulatory Visit: Payer: Self-pay | Admitting: Internal Medicine

## 2023-01-28 ENCOUNTER — Ambulatory Visit (HOSPITAL_BASED_OUTPATIENT_CLINIC_OR_DEPARTMENT_OTHER): Payer: 59 | Admitting: Pulmonary Disease

## 2023-01-28 ENCOUNTER — Encounter (HOSPITAL_BASED_OUTPATIENT_CLINIC_OR_DEPARTMENT_OTHER): Payer: Self-pay | Admitting: Pulmonary Disease

## 2023-01-28 VITALS — BP 138/84 | HR 65 | Resp 18 | Ht 65.5 in | Wt 307.1 lb

## 2023-01-28 DIAGNOSIS — G4733 Obstructive sleep apnea (adult) (pediatric): Secondary | ICD-10-CM

## 2023-01-28 NOTE — Patient Instructions (Signed)
X Rx for AirFit F30 fullface mask and CPAP supplies  Try to use your machine at least 4 hours every night and then we can try to get you a new machine

## 2023-01-28 NOTE — Addendum Note (Signed)
Addended by: Shirley Muscat on: 01/28/2023 01:11 PM   Modules accepted: Orders

## 2023-01-28 NOTE — Progress Notes (Signed)
Subjective:    Patient ID: Tracy Sampson, female    DOB: Apr 11, 1956, 67 y.o.   MRN: [id number]  HPI  67  yo obese woman  for FU of obstructive sleep apnea diagnosed in 08/2001 on autoCPAP. Last visit was in 2068 and she presents to reestablish care.  She also requests a new CPAP machine.  Her weight is decreased slightly from 3 1 5  to 307 pounds.  She continues on auto CPAP settings 8 to 18 cm.  Epworth sleepiness score is 8.  Bedtime is around midnight, she sleeps on her back with 2 pillows, reports 1-2 nocturnal awakenings and is out of bed at 7 AM with dryness of mouth, denies headaches There is no history suggestive of cataplexy, sleep paralysis or parasomnias   Significant tests/ events reviewed   04/2001 (240 lbs) >> RDI 57/h, low sat 86% Rpt titration 04/2008- wt 298 lbs >> CPAP 15 cm   Past Medical History:  Diagnosis Date   Allergic rhinitis    Allergy    Arthritis    Cataract    bilateral removed   Depression    GERD (gastroesophageal reflux disease)    Glaucoma    Hypertension    Hypothyroidism    Morbid obesity (HCC)    OSA (obstructive sleep apnea)    Dr Delford Field   Sleep apnea    wears cpap   Thyroid disease    Urinary incontinence     Past Surgical History:  Procedure Laterality Date   abdominal supracervical hysterectomy with bilateral salpingo-oophorectomy     fibroid uterus   ANKLE SURGERY  4/10   rt   BREAST REDUCTION SURGERY     CATARACT EXTRACTION     COLONOSCOPY     COLONOSCOPY WITH PROPOFOL N/A 12/19/2019   Procedure: COLONOSCOPY WITH PROPOFOL;  Surgeon: Napoleon Form, MD;  Location: [facility];  Service: Endoscopy;  Laterality: N/A;   HERNIA REPAIR  2017   umbillical   HYSTEROSCOPY N/A 07/25/2019   Procedure: HYSTEROSCOPY OF CERVIX WITH ULTRASOUND GUIDANCE/KS;  Surgeon: Romualdo Bolk, MD;  Location: [facility] OR;  Service: Gynecology;  Laterality: N/A;   INSERTION OF MESH N/A 01/07/2016   Procedure: INSERTION OF MESH;  Surgeon:  Abigail Miyamoto, MD;  Location: [facility];  Service: General;  Laterality: N/A;   OPERATIVE ULTRASOUND N/A 07/25/2019   Procedure: OPERATIVE ULTRASOUND;  Surgeon: Romualdo Bolk, MD;  Location: [facility] OR;  Service: Gynecology;  Laterality: N/A;   POLYPECTOMY  12/19/2019   Procedure: POLYPECTOMY;  Surgeon: Napoleon Form, MD;  Location: [facility];  Service: Endoscopy;;   REDUCTION MAMMAPLASTY     VENTRAL HERNIA REPAIR N/A 01/07/2016   Procedure: VENTRAL HERNIA REPAIR;  Surgeon: Abigail Miyamoto, MD;  Location: [facility];  Service: General;  Laterality: N/A;     Allergies  Allergen Reactions   Citalopram     Side effects   Tribenzor [Olmesartan-Amlodipine-Hctz]     "It made me sick" UNSPECIFIED REACTION     Social History   Socioeconomic History   Marital status: Single    Spouse name: Not on file   Number of children: Not on file   Years of education: Not on file   Highest education level: Not on file  Occupational History   Occupation: [employer]    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  Tobacco Use   Smoking status: Never    Passive exposure: Never   Smokeless tobacco: Never  Vaping Use   Vaping status:  Never Used  Substance and Sexual Activity   Alcohol use: No    Alcohol/week: 0.0 standard drinks of alcohol   Drug use: No   Sexual activity: Not Currently    Partners: Male    Birth control/protection: Surgical  Other Topics Concern   Not on file  Social History Narrative   Not on file   Social Determinants of Health   Financial Resource Strain: Not on file  Food Insecurity: Not on file  Transportation Needs: Not on file  Physical Activity: Not on file  Stress: Not on file  Social Connections: Not on file  Intimate Partner Violence: Not on file    Family History  Problem Relation Age of Onset   Breast cancer Mother    Cancer Mother        breast   Hypertension Mother    Colon cancer Maternal Grandmother    Cancer Maternal Grandmother        colon      Review of Systems neg for any significant sore throat, dysphagia, itching, sneezing, nasal congestion or excess/ purulent secretions, fever, chills, sweats, unintended wt loss, pleuritic or exertional cp, hempoptysis, orthopnea pnd or change in chronic leg swelling. Also denies presyncope, palpitations, heartburn, abdominal pain, nausea, vomiting, diarrhea or change in bowel or urinary habits, dysuria,hematuria, rash, arthralgias, visual complaints, headache, numbness weakness or ataxia.     Objective:   Physical Exam  Gen. Pleasant, obese, in no distress ENT - no lesions, no post nasal drip Neck: No JVD, no thyromegaly, no carotid bruits Lungs: no use of accessory muscles, no dullness to percussion, decreased without rales or rhonchi  Cardiovascular: Rhythm regular, heart sounds  normal, no murmurs or gallops, no peripheral edema Musculoskeletal: No deformities, no cyanosis or clubbing , no tremors       Assessment & Plan:

## 2023-01-28 NOTE — Assessment & Plan Note (Signed)
CPAP download was reviewed which shows poor compliance averaging 3.5 hours per night and large leak.  Auto settings 8 to 18 cm she has residual events with an average of 8 cm required.  It does seem that she would do better with a better fitting mask.  We reviewed different kinds of fullface mask and she preferred the AirFit F30 which again provided.  Hopefully this will lead to an improvement in compliance.  Once she improves her compliance we can get her a new machine if necessary. Weight loss encouraged, compliance with goal of at least 4-6 hrs every night is the expectation. Advised against medications with sedative side effects Cautioned against driving when sleepy - understanding that sleepiness will vary on a day to day basis

## 2023-02-06 DIAGNOSIS — G4733 Obstructive sleep apnea (adult) (pediatric): Secondary | ICD-10-CM | POA: Diagnosis not present

## 2023-02-10 ENCOUNTER — Ambulatory Visit (INDEPENDENT_AMBULATORY_CARE_PROVIDER_SITE_OTHER): Payer: 59 | Admitting: Internal Medicine

## 2023-02-10 ENCOUNTER — Encounter: Payer: Self-pay | Admitting: Internal Medicine

## 2023-02-10 VITALS — BP 110/70 | HR 78 | Temp 98.0°F | Ht 65.5 in | Wt 308.0 lb

## 2023-02-10 DIAGNOSIS — R0789 Other chest pain: Secondary | ICD-10-CM | POA: Diagnosis not present

## 2023-02-10 DIAGNOSIS — N3281 Overactive bladder: Secondary | ICD-10-CM

## 2023-02-10 DIAGNOSIS — R55 Syncope and collapse: Secondary | ICD-10-CM | POA: Diagnosis not present

## 2023-02-10 DIAGNOSIS — R7301 Impaired fasting glucose: Secondary | ICD-10-CM | POA: Diagnosis not present

## 2023-02-10 LAB — URINALYSIS
Bilirubin Urine: NEGATIVE
Hgb urine dipstick: NEGATIVE
Ketones, ur: NEGATIVE
Leukocytes,Ua: NEGATIVE
Nitrite: NEGATIVE
Specific Gravity, Urine: >=1.03 — AB (ref 1.000–1.030)
Total Protein, Urine: NEGATIVE
Urine Glucose: NEGATIVE
Urobilinogen, UA: 0.2 (ref 0.0–1.0)
pH: 5.5 (ref 5.0–8.0)

## 2023-02-10 LAB — COMPREHENSIVE METABOLIC PANEL
ALT: 20 U/L (ref 0–35)
AST: 23 U/L (ref 0–37)
Albumin: 4 g/dL (ref 3.5–5.2)
Alkaline Phosphatase: 82 U/L (ref 39–117)
BUN: 16 mg/dL (ref 6–23)
CO2: 28 mEq/L (ref 19–32)
Calcium: 9.7 mg/dL (ref 8.4–10.5)
Chloride: 109 mEq/L (ref 96–112)
Creatinine, Ser: 0.82 mg/dL (ref 0.40–1.20)
GFR: 73.66 mL/min (ref >60.00)
Glucose, Bld: 115 mg/dL — ABNORMAL HIGH (ref 70–99)
Potassium: 4 mEq/L (ref 3.5–5.1)
Sodium: 146 mEq/L — ABNORMAL HIGH (ref 135–145)
Total Bilirubin: 0.5 mg/dL (ref 0.2–1.2)
Total Protein: 7.4 g/dL (ref 6.0–8.3)

## 2023-02-10 LAB — CBC WITH DIFFERENTIAL/PLATELET
Basophils Absolute: 0 10*3/uL (ref 0.0–0.1)
Basophils Relative: 0.5 % (ref 0.0–3.0)
Eosinophils Absolute: 0.5 10*3/uL (ref 0.0–0.7)
Eosinophils Relative: 6.4 % — ABNORMAL HIGH (ref 0.0–5.0)
HCT: 41 % (ref 36.0–46.0)
Hemoglobin: 12.9 g/dL (ref 12.0–15.0)
Lymphocytes Relative: 31.7 % (ref 12.0–46.0)
Lymphs Abs: 2.3 10*3/uL (ref 0.7–4.0)
MCHC: 31.4 g/dL (ref 30.0–36.0)
MCV: 92.6 fl (ref 78.0–100.0)
Monocytes Absolute: 0.5 10*3/uL (ref 0.1–1.0)
Monocytes Relative: 7.3 % (ref 3.0–12.0)
Neutro Abs: 4 10*3/uL (ref 1.4–7.7)
Neutrophils Relative %: 54.1 % (ref 43.0–77.0)
Platelets: 252 10*3/uL (ref 150.0–400.0)
RBC: 4.43 Mil/uL (ref 3.87–5.11)
RDW: 14.1 % (ref 11.5–15.5)
WBC: 7.3 10*3/uL (ref 4.0–10.5)

## 2023-02-10 LAB — VITAMIN B12: Vitamin B-12: 426 pg/mL (ref 211–911)

## 2023-02-10 LAB — TSH: TSH: 0.45 u[IU]/mL (ref 0.35–5.50)

## 2023-02-10 MED ORDER — TOLTERODINE TARTRATE ER 4 MG PO CP24: 4.0000 mg | ORAL_CAPSULE | Freq: Every day | ORAL | 11 refills | Status: AC

## 2023-02-10 NOTE — Assessment & Plan Note (Signed)
Vaso-vagal most likely  EKG - NSR. 64 bpm. No acute changes.  Tracy Sampson had her last syncopal spell in July 2024.  She was going from a parking lot to the airport to catch an early morning flight when she suddenly collapsed and passed out.  She regained consciousness and continued on her trip. Another episode happened at home about 12 months ago in the kitchen  No driving recommendations were discussed with Steward Drone. Obtain cardiology consultation Go to ER if reoccurred Obtain cardiac echo, lab work including CBC etc.

## 2023-02-10 NOTE — Progress Notes (Unsigned)
Subjective:  Patient ID: Tracy Sampson, female    DOB: 05/24/1955  Age: 67 y.o. MRN: [id number]  CC: Follow-up (3 MNTH F/U, Pt has stated she had a syncope episode back in August and noticed this happened to her last around the same time. Pt wants to discuss medications as well.)   HPI Tracy Sampson presents for syncope.  Tracy Sampson had her last syncopal spell in July 2024.  She was going from a parking lot to the airport to catch an early morning flight when she suddenly collapsed and passed out.  She regained consciousness and continued on her trip. Another episode happened at home about 12 months ago in the kitchen She had some atypical chest pain episodes off-and-on  C/o OAB  Outpatient Medications Prior to Visit  Medication Sig Dispense Refill   amLODipine (NORVASC) 5 MG tablet 1 po qd 90 tablet 3   cholecalciferol (VITAMIN D3) 25 MCG (1000 UNIT) tablet Take 1,000 Units by mouth daily.     Cholecalciferol (VITAMIN D3) 50 MCG (2000 UT) capsule Take 1 capsule (2,000 Units total) by mouth daily. 100 capsule 3   cyclobenzaprine (FLEXERIL) 5 MG tablet Take 1 tablet (5 mg total) by mouth 3 (three) times daily as needed for muscle spasms. 30 tablet 0   diphenoxylate-atropine (LOMOTIL) 2.5-0.025 MG tablet Take 1-2 tablets by mouth 4 (four) times daily as needed for diarrhea or loose stools. 60 tablet 1   dorzolamide-timolol (COSOPT) 22.3-6.8 MG/ML ophthalmic solution Place 1 drop into both eyes 2 (two) times daily.      famotidine (PEPCID) 40 MG tablet Take 1 tablet (40 mg total) by mouth daily. 90 tablet 2   levothyroxine (EUTHYROX) 150 MCG tablet Take 1 tablet (150 mcg total) by mouth daily before breakfast. 90 tablet 3   losartan (COZAAR) 100 MG tablet TAKE 1 TABLET(100 MG) BY MOUTH DAILY 90 tablet 3   meloxicam (MOBIC) 7.5 MG tablet TAKE 1 TABLET BY MOUTH EVERY DAY 30 tablet 0   potassium chloride (KLOR-CON) 10 MEQ tablet Take 1 tablet (10 mEq total) by mouth daily. 90 tablet 3    sertraline (ZOLOFT) 100 MG tablet TAKE 1.5 TABLET BY MOUTH EVERY DAY 135 tablet 3   triamcinolone cream (KENALOG) 0.1 % Apply 1 Application topically 3 (three) times daily. 80 g 3   vitamin B-12 (CYANOCOBALAMIN) 500 MCG tablet Take 500 mcg by mouth daily.     oxybutynin (DITROPAN) 5 MG tablet TAKE 1-2 TABLETS (5-10 MG TOTAL) BY MOUTH 3 (THREE) TIMES DAILY AS NEEDED FOR BLADDER SPASMS. 270 tablet 1   No facility-administered medications prior to visit.    ROS: Review of Systems  Constitutional:  Negative for activity change, appetite change, chills, fatigue and unexpected weight change.  HENT:  Negative for congestion, mouth sores and sinus pressure.   Eyes:  Negative for visual disturbance.  Respiratory:  Positive for shortness of breath. Negative for cough and chest tightness.   Gastrointestinal:  Negative for abdominal pain and nausea.  Genitourinary:  Negative for difficulty urinating, frequency and vaginal pain.  Musculoskeletal:  Negative for back pain and gait problem.  Skin:  Negative for pallor and rash.  Neurological:  Positive for syncope. Negative for dizziness, tremors, seizures, speech difficulty, weakness, light-headedness, numbness and headaches.  Psychiatric/Behavioral:  Negative for confusion and sleep disturbance.     Objective:  BP 110/70 (BP Location: Left Arm, Patient Position: Sitting, Cuff Size: Large)   Pulse 78   Temp 98 F (36.7 C) (Oral)  Ht 5' 5.5" (1.664 m)   Wt (!) 308 lb (139.7 kg)   LMP  (LMP Unknown)   SpO2 97%   BMI 50.47 kg/m   BP Readings from Last 3 Encounters:  02/10/23 110/70  01/28/23 138/84  09/23/22 118/80    Wt Readings from Last 3 Encounters:  02/10/23 (!) 308 lb (139.7 kg)  01/28/23 (!) 307 lb 1.6 oz (139.3 kg)  09/23/22 (!) 304 lb (137.9 kg)    Physical Exam Constitutional:      General: She is not in acute distress.    Appearance: She is well-developed. She is obese.  HENT:     Head: Normocephalic.     Right Ear:  External ear normal.     Left Ear: External ear normal.     Nose: Nose normal.  Eyes:     General:        Right eye: No discharge.        Left eye: No discharge.     Conjunctiva/sclera: Conjunctivae normal.     Pupils: Pupils are equal, round, and reactive to light.  Neck:     Thyroid: No thyromegaly.     Vascular: No JVD.     Trachea: No tracheal deviation.  Cardiovascular:     Rate and Rhythm: Normal rate and regular rhythm.     Heart sounds: Normal heart sounds.  Pulmonary:     Effort: No respiratory distress.     Breath sounds: No stridor. No wheezing.  Abdominal:     General: Bowel sounds are normal. There is no distension.     Palpations: Abdomen is soft. There is no mass.     Tenderness: There is no abdominal tenderness. There is no guarding or rebound.  Musculoskeletal:        General: No tenderness.     Cervical back: Normal range of motion and neck supple. No rigidity.  Lymphadenopathy:     Cervical: No cervical adenopathy.  Skin:    Findings: No erythema or rash.  Neurological:     Mental Status: She is oriented to person, place, and time.     Cranial Nerves: No cranial nerve deficit.     Motor: No abnormal muscle tone.     Coordination: Coordination normal.     Deep Tendon Reflexes: Reflexes normal.  Psychiatric:        Behavior: Behavior normal.        Thought Content: Thought content normal.        Judgment: Judgment normal.      Procedure: EKG Indication: syncope Impression: NSR. 64 bpm. No acute changes.  Lab Results  Component Value Date   WBC 7.3 02/10/2023   HGB 12.9 02/10/2023   HCT 41.0 02/10/2023   PLT 252.0 02/10/2023   GLUCOSE 115 (H) 02/10/2023   CHOL 221 (H) 11/06/2021   TRIG 161.0 (H) 11/06/2021   HDL 45.20 11/06/2021   LDLDIRECT 149.3 10/23/2010   LDLCALC 144 (H) 11/06/2021   ALT 20 02/10/2023   AST 23 02/10/2023   NA 146 (H) 02/10/2023   K 4.0 02/10/2023   CL 109 02/10/2023   CREATININE 0.82 02/10/2023   BUN 16 02/10/2023    CO2 28 02/10/2023   TSH 0.45 02/10/2023   INR 1.1 (H) 09/05/2019   HGBA1C 5.7 09/23/2022    MM 3D SCREEN BREAST BILATERAL  Result Date: 05/06/2022 CLINICAL DATA:  Screening. EXAM: DIGITAL SCREENING BILATERAL MAMMOGRAM WITH TOMOSYNTHESIS AND CAD TECHNIQUE: Bilateral screening digital craniocaudal and mediolateral oblique mammograms were  obtained. Bilateral screening digital breast tomosynthesis was performed. The images were evaluated with computer-aided detection. COMPARISON:  Previous exam(s). ACR Breast Density Category b: There are scattered areas of fibroglandular density. FINDINGS: There are no findings suspicious for malignancy. IMPRESSION: No mammographic evidence of malignancy. A result letter of this screening mammogram will be mailed directly to the patient. RECOMMENDATION: Screening mammogram in one year. (Code:[id number]-B-01Y) BI-RADS CATEGORY  1: Negative. Electronically Signed   By: Gerome Sam III M.D.   On: 05/06/2022 17:51    Assessment & Plan:   Problem List Items Addressed This Visit     OBESITY, MORBID    Obesity is likely to be contributing in her dyspnea on exertion      Overactive bladder    She has not been taking Ditropan.  Will discontinue Detrol 4 mg twice daily as needed for travel, visiting events      Elevated fasting glucose    Check A1c      Syncope - Primary    Vaso-vagal most likely  EKG - NSR. 64 bpm. No acute changes.  Jeanann had her last syncopal spell in July 2024.  She was going from a parking lot to the airport to catch an early morning flight when she suddenly collapsed and passed out.  She regained consciousness and continued on her trip. Another episode happened at home about 12 months ago in the kitchen  No driving recommendations were discussed with Steward Drone. Obtain cardiology consultation Go to ER if reoccurred Obtain cardiac echo, lab work including CBC etc.      Relevant Orders   EKG 12-Lead   ECHOCARDIOGRAM COMPLETE    Comprehensive metabolic panel (Completed)   TSH (Completed)   Vitamin B12 (Completed)   Urinalysis (Completed)   CBC with Differential/Platelet (Completed)   Troponin I   Ambulatory referral to Cardiology   Chest pain, atypical    Obtain cardiology consultation, 2D echo, labs EKG reviewed      Relevant Orders   Troponin I      Meds ordered this encounter  Medications   tolterodine (DETROL LA) 4 MG 24 hr capsule    Sig: Take 1 capsule (4 mg total) by mouth daily.    Dispense:  60 capsule    Refill:  11      Follow-up: Return in about 3 months (around 05/12/2023) for a follow-up visit.  Sonda Primes, MD

## 2023-02-11 DIAGNOSIS — R0789 Other chest pain: Secondary | ICD-10-CM | POA: Insufficient documentation

## 2023-02-11 LAB — TROPONIN I: Troponin I: 11 ng/L (ref <=47)

## 2023-02-11 NOTE — Assessment & Plan Note (Signed)
Obtain cardiology consultation, 2D echo, labs EKG reviewed

## 2023-02-11 NOTE — Assessment & Plan Note (Signed)
Check A1c 

## 2023-02-11 NOTE — Assessment & Plan Note (Signed)
She has not been taking Ditropan.  Will discontinue Detrol 4 mg twice daily as needed for travel, visiting events

## 2023-02-11 NOTE — Assessment & Plan Note (Signed)
Obesity is likely to be contributing in her dyspnea on exertion

## 2023-02-24 ENCOUNTER — Telehealth: Payer: Self-pay | Admitting: Internal Medicine

## 2023-02-24 NOTE — Telephone Encounter (Signed)
Patient called and said she would like to go over lab results from 02/10/2023. She said she hasn't heard anything. Best callback is [phone]. She said it was okay to leave a message.

## 2023-02-24 NOTE — Telephone Encounter (Signed)
Provider has not resulted the labs as of yet. Once PCP results them pt will get a call back to discuss PCP recommendations.

## 2023-02-25 NOTE — Telephone Encounter (Signed)
Patient called back and would like someone to call her with results

## 2023-02-26 NOTE — Telephone Encounter (Addendum)
Blood work is normal except for slightly elevated glucose of 115 and no signs of decreased consumption of fluids.  Tracy Sampson needs to hydrate herself better -needs to drink more water.  Thank you

## 2023-02-26 NOTE — Telephone Encounter (Signed)
I was able to lvm for pt to call clinic back for lab results as provider has stated " Blood work is normal except for slightly elevated glucose of 115 and no signs of decreased consumption of fluids.  Tracy Sampson needs to hydrate herself better -needs to drink more water.  Thank you"

## 2023-02-26 NOTE — Telephone Encounter (Signed)
Patient returned Tracy Sampson's call and would like a call back at [phone].

## 2023-03-02 NOTE — Telephone Encounter (Signed)
LVM for pt to call back for lab results.

## 2023-03-03 ENCOUNTER — Ambulatory Visit (HOSPITAL_COMMUNITY): Payer: 59

## 2023-03-05 ENCOUNTER — Ambulatory Visit (HOSPITAL_COMMUNITY): Payer: 59 | Attending: Internal Medicine

## 2023-03-05 DIAGNOSIS — R55 Syncope and collapse: Secondary | ICD-10-CM | POA: Insufficient documentation

## 2023-03-05 LAB — ECHOCARDIOGRAM COMPLETE
Area-P 1/2: 2.97 cm2
S' Lateral: 2.5 cm

## 2023-03-08 DIAGNOSIS — G4733 Obstructive sleep apnea (adult) (pediatric): Secondary | ICD-10-CM | POA: Diagnosis not present

## 2023-04-07 ENCOUNTER — Other Ambulatory Visit: Payer: Self-pay | Admitting: Internal Medicine

## 2023-04-08 DIAGNOSIS — G4733 Obstructive sleep apnea (adult) (pediatric): Secondary | ICD-10-CM | POA: Diagnosis not present

## 2023-04-12 ENCOUNTER — Encounter: Payer: Self-pay | Admitting: Cardiology

## 2023-04-12 ENCOUNTER — Encounter: Payer: Self-pay | Admitting: *Deleted

## 2023-04-12 ENCOUNTER — Ambulatory Visit: Payer: 59 | Attending: Cardiology | Admitting: Cardiology

## 2023-04-12 VITALS — BP 142/78 | HR 83 | Ht 66.0 in | Wt 305.8 lb

## 2023-04-12 DIAGNOSIS — R072 Precordial pain: Secondary | ICD-10-CM | POA: Insufficient documentation

## 2023-04-12 DIAGNOSIS — R55 Syncope and collapse: Secondary | ICD-10-CM | POA: Diagnosis not present

## 2023-04-12 NOTE — Progress Notes (Signed)
Cardiology Office Note:  .   Date:  04/12/2023  ID:  Tracy Sampson, DOB 1955-11-18, MRN [id number] PCP: Tracy Garter, MD  [facility] Providers Cardiologist:  Truett Mainland, MD PCP: Tracy Garter, MD  Chief Complaint  Patient presents with   Loss of Consciousness      History of Present Illness: .    Tracy Sampson is a 67 y.o. female with hypertension, hypothyroidism, syncope, chest pain  Episode of syncope occurred in August 2024.  Patient was going from parking lot to the airport to catch an early morning flight when she had an episode of syncope.  The patient, with a history of hypertension, presents for evaluation of two episodes of syncope. The first episode occurred last year on their birthday when she stood up from sitting on the couch and suddenly fell to the floor. The second episode occurred this past July at an airport. She was rushing and had not eaten or drunk anything prior to the episode. She was walking into the airport when she suddenly lost consciousness and fell to the ground. Both episodes were brief, lasting only a few seconds, and she regained consciousness quickly. She denies any warning signs prior to the episodes and deny any associated chest pain or shortness of breath.  She also report occasional chest pain that they describe as a "little throbbing." The pain occurs at rest and is worse when she does not use her sleep apnea mask. She recently received a new mask and have been having difficulty sleeping due to air leakage. She denies any chest pain with exertion and denies any associated shortness of breath or leg swelling.  The patient lives alone and is independent but not very active. 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 She denies smoking, alcohol use, and regular caffeine intake. She has no known family history of heart disease.  Vitals:   04/12/23 1507  BP: (!) 142/78  Pulse: 83   SpO2: 93%     ROS:  Review of Systems  Cardiovascular:  Positive for chest pain and syncope. Negative for dyspnea on exertion, leg swelling and palpitations.     Studies Reviewed: [name]        EKG 04/12/2023: Normal sinus rhythm Normal ECG When compared with ECG of 21-Jul-2019 09:50, No significant change was found  Independently interpreted Labs 01/2023:  Na 146, Hb 12.9 HbA1C 5.7% TSH 0.4  10/2021: Chol 221, TG 161, HDL 45, LDL 161    Physical Exam:   Physical Exam Vitals and nursing note reviewed.  Constitutional:      General: She is not in acute distress.    Appearance: She is obese.  Neck:     Vascular: No JVD.  Cardiovascular:     Rate and Rhythm: Normal rate and regular rhythm.     Heart sounds: Normal heart sounds. No murmur heard. Pulmonary:     Effort: Pulmonary effort is normal.     Breath sounds: Normal breath sounds. No wheezing or rales.  Musculoskeletal:     Right lower leg: No edema.     Left lower leg: No edema.      VISIT DIAGNOSES:   ICD-10-CM   1. Syncope and collapse  R55 EKG 12-Lead    ECHOCARDIOGRAM COMPLETE    Cardiac event monitor    Cardiac Stress Test: Informed Consent Details: Physician/Practitioner Attestation; Transcribe to consent form and obtain patient signature    2.  Precordial pain  R07.2 ECHOCARDIOGRAM COMPLETE    MYOCARDIAL PERFUSION IMAGING    Cardiac Stress Test: Informed Consent Details: Physician/Practitioner Attestation; Transcribe to consent form and obtain patient signature       ASSESSMENT AND PLAN: .    OLYVIAH Sampson is a 67 y.o. female withhypertension, hypothyroidism, syncope, chest pain  Syncope: 2 episodes 12 hours apart in the summer 2023 and 2024. At least 1 episode was exertional. Dehydration and orthostatic hypotension still most likely culprits. Encourage liberal hydration. Will check echocardiogram.  Given exertional component of syncope, as well as some atypical chest pain, will obtain  exercise nuclear stress test.  Hypertension: Blood pressure elevated today.  However, generally is well-controlled.  Given ongoing workup for syncope, have not made any changes to her antihypertensive medications today.   Informed Consent   Shared Decision Making/Informed Consent{ The risks [chest pain, shortness of breath, cardiac arrhythmias, dizziness, blood pressure fluctuations, myocardial infarction, stroke/transient ischemic attack, nausea, vomiting, allergic reaction, radiation exposure, metallic taste sensation and life-threatening complications (estimated to be 1 in 10,000)], benefits (risk stratification, diagnosing coronary artery disease, treatment guidance) and alternatives of a nuclear stress test were discussed in detail with Ms. Basil and she agrees to proceed.       No orders of the defined types were placed in this encounter.    F/u in 6 months   Signed, Elder Negus, MD

## 2023-04-12 NOTE — Patient Instructions (Addendum)
Medication Instructions:   Your physician recommends that you continue on your current medications as directed. Please refer to the Current Medication list given to you today.  *If you need a refill on your cardiac medications before your next appointment, please call your pharmacy*    Testing/Procedures:  Your physician has requested that you have an echocardiogram. Echocardiography is a painless test that uses sound waves to create images of your heart. It provides your doctor with information about the size and shape of your heart and how well your heart's chambers and valves are working. This procedure takes approximately one hour. There are no restrictions for this procedure. Please do NOT wear cologne, perfume, aftershave, or lotions (deodorant is allowed). Please arrive 15 minutes prior to your appointment time.  Please note: We ask at that you not bring children with you during ultrasound (echo/ vascular) testing. Due to room size and safety concerns, children are not allowed in the ultrasound rooms during exams. Our front office staff cannot provide observation of children in our lobby area while testing is being conducted. An adult accompanying a patient to their appointment will only be allowed in the ultrasound room at the discretion of the ultrasound technician under special circumstances. We apologize for any inconvenience.    Your physician has recommended that you wear an event monitor. Event monitors are medical devices that record the heart's electrical activity. Doctors most often [place] these monitors to diagnose arrhythmias. Arrhythmias are problems with the speed or rhythm of the heartbeat. The monitor is a small, portable device. You can wear one while you do your normal daily activities. This is usually used to diagnose what is causing palpitations/syncope (passing out).  PATIENT NEEDS THIS SCHEDULED AS AN APPOINTMENT FOR CLINIC PLACEMENT   Your physician has requested that  you have en exercise stress myoview. For further information please visit [email or web address] Please follow instruction sheet, as given.    Follow-Up: At [facility], you and your health needs are our priority.  As part of our continuing mission to provide you with exceptional heart care, we have created designated Provider Care Teams.  These Care Teams include your primary Cardiologist (physician) and Advanced Practice Providers (APPs -  Physician Assistants and Nurse Practitioners) who all work together to provide you with the care you need, when you need it.  We recommend signing up for the patient portal called "MyChart".  Sign up information is provided on this After Visit Summary.  MyChart is used to connect with patients for Virtual Visits (Telemedicine).  Patients are able to view lab/test results, encounter notes, upcoming appointments, etc.  Non-urgent messages can be sent to your provider as well.   To learn more about what you can do with MyChart, go to [email or web address].    Your next appointment:   6 month(s)  Provider:   Jari Favre, PA-C, Ronie Spies, PA-C, Robin Searing, NP, Jacolyn Reedy, PA-C, Eligha Bridegroom, NP, Tereso Newcomer, PA-C, or Perlie Gold, PA-C

## 2023-04-28 ENCOUNTER — Telehealth (HOSPITAL_COMMUNITY): Payer: Self-pay | Admitting: *Deleted

## 2023-04-28 NOTE — Telephone Encounter (Signed)
Left message on voicemail per DPR in reference to upcoming appointment scheduled on  05/05/23 with detailed instructions given per Myocardial Perfusion Study Information Sheet for the test. LM to arrive 15 minutes early, and that it is imperative to arrive on time for appointment to keep from having the test rescheduled. If you need to cancel or reschedule your appointment, please call the office within 24 hours of your appointment. Failure to do so may result in a cancellation of your appointment, and a $50 no show fee. Phone number given for call back for any questions. Tracy Sampson

## 2023-05-04 ENCOUNTER — Other Ambulatory Visit (HOSPITAL_BASED_OUTPATIENT_CLINIC_OR_DEPARTMENT_OTHER): Payer: Self-pay | Admitting: Internal Medicine

## 2023-05-04 DIAGNOSIS — Z1231 Encounter for screening mammogram for malignant neoplasm of breast: Secondary | ICD-10-CM

## 2023-05-05 ENCOUNTER — Ambulatory Visit (INDEPENDENT_AMBULATORY_CARE_PROVIDER_SITE_OTHER): Payer: 59

## 2023-05-05 ENCOUNTER — Ambulatory Visit
Admission: RE | Admit: 2023-05-05 | Discharge: 2023-05-05 | Disposition: A | Discharge status: Home / Self Care | Payer: 59 | Source: Ambulatory Visit | Attending: Cardiology | Admitting: Cardiology

## 2023-05-05 DIAGNOSIS — R072 Precordial pain: Secondary | ICD-10-CM

## 2023-05-05 DIAGNOSIS — R55 Syncope and collapse: Secondary | ICD-10-CM | POA: Insufficient documentation

## 2023-05-05 MED ORDER — TECHNETIUM TC 99M TETROFOSMIN IV KIT
33.0000 | PACK | Freq: Once | INTRAVENOUS | Status: AC | PRN
  Administered 2023-05-05: 33 via INTRAVENOUS

## 2023-05-05 MED ORDER — REGADENOSON 0.4 MG/5ML IV SOLN
0.4000 mg | Freq: Once | INTRAVENOUS | Status: AC
  Administered 2023-05-05: 0.4 mg via INTRAVENOUS

## 2023-05-05 NOTE — Progress Notes (Unsigned)
[employer] event monitor serial # [id number] from office inventory applied to patient.

## 2023-05-06 ENCOUNTER — Ambulatory Visit (HOSPITAL_COMMUNITY): Payer: 59 | Attending: Cardiology

## 2023-05-06 LAB — MYOCARDIAL PERFUSION IMAGING
Base ST Depression (mm): 0 mm
LV dias vol: 64 mL (ref 46–106)
LV sys vol: 18 mL
Nuc Stress EF: 72 %
Peak HR: 78 {beats}/min
Rest HR: 67 {beats}/min
Rest Nuclear Isotope Dose: 31.1 mCi
SDS: 6
SRS: 1
SSS: 7
ST Depression (mm): 0 mm
Stress Nuclear Isotope Dose: 33 mCi
TID: 0.71

## 2023-05-06 MED ORDER — TECHNETIUM TC 99M TETROFOSMIN IV KIT
31.1000 | PACK | Freq: Once | INTRAVENOUS | Status: AC | PRN
  Administered 2023-05-06: 31.1 via INTRAVENOUS

## 2023-05-06 NOTE — Progress Notes (Signed)
Normal stress test

## 2023-05-11 ENCOUNTER — Ambulatory Visit (HOSPITAL_BASED_OUTPATIENT_CLINIC_OR_DEPARTMENT_OTHER)
Admission: RE | Admit: 2023-05-11 | Discharge: 2023-05-11 | Disposition: A | Discharge status: Home / Self Care | Payer: 59 | Source: Ambulatory Visit | Attending: Internal Medicine | Admitting: Internal Medicine

## 2023-05-11 ENCOUNTER — Encounter (HOSPITAL_BASED_OUTPATIENT_CLINIC_OR_DEPARTMENT_OTHER): Payer: Self-pay

## 2023-05-11 DIAGNOSIS — Z1231 Encounter for screening mammogram for malignant neoplasm of breast: Secondary | ICD-10-CM | POA: Diagnosis not present

## 2023-05-18 ENCOUNTER — Ambulatory Visit: Payer: 59 | Admitting: Internal Medicine

## 2023-06-02 ENCOUNTER — Telehealth: Payer: Self-pay | Admitting: Cardiology

## 2023-06-02 NOTE — Telephone Encounter (Signed)
 I did not realzie that patient had echocardiogram in 02/2023, that was fairly normal. Does not need repeat echocardiogram.  Thanks MJP

## 2023-06-02 NOTE — Telephone Encounter (Signed)
 LVM (per DPR) on home phone number explaining that per Dr. Filiberto Hug stated pt doesn't need to have a repeat echo since she had one in 02/2023 and resulted normal. Pt told to call our office with any questions.

## 2023-06-02 NOTE — Telephone Encounter (Signed)
 Patient is calling to see why she has another echo due because she just had one done in October. Please advise

## 2023-06-03 NOTE — Telephone Encounter (Signed)
Was able to make contact with the pt.   She is aware we will cancel her upcoming echo appt on 06/07/23, for Dr. Rosemary Holms stated this appt is not needed, being she had one back in Oct, with fairly normal findings.   She is aware I will cancel the echo appt for then.  Pt verbalized understanding and agrees with this plan.  Pt was more than gracious for all the assistance provided.

## 2023-06-07 ENCOUNTER — Other Ambulatory Visit (HOSPITAL_COMMUNITY): Payer: 59

## 2023-06-11 ENCOUNTER — Ambulatory Visit: Payer: Self-pay | Admitting: Internal Medicine

## 2023-06-11 NOTE — Telephone Encounter (Addendum)
Chief Complaint: Syncope Symptoms: Syncope, sore on hands and left knee Frequency: Occasionally Pertinent Negatives: Patient denies Head injury, dizziness, confusion, arm weakness, numbness, loss of bowel/bladder function, vision changes, slurred speech, facial droop, chest pain Disposition: [] ED /[] Urgent Care (no appt availability in office) / [x] Appointment(In office/virtual)/ []  [facility]/ [] Home Care/ [] Refused Recommended Disposition /[] [facility]/ []  Follow-up with PCP Additional Notes: Patient had complaints of syncope for the past couple of months, with the latest episode being yesterday where patient suddenly passed out on the concrete. Patient states she was unconscious for less than five minutes and denies any head injury or back pain. Patients states that she has weakness in her legs and couldn't get up for about 30 minutes after syncopal episode, but mentioned leg weakness has been present prior to the start of her syncope a couple of months ago. Patient had recent cardiac workup due to her fainting with no notable results. Patient A&Ox3, speaking in full, coherent sentences, speech normal. Patients states denies being diabetic or having history of seizures. Per protocol, Patient was advised by this RN to be seen within 2 weeks. Patient agreeable and advised by this RN to maintain safety precautions by getting to floor or safe place if aura of fainting occurs and to call for medical help if fainting continues. Patient verbalized understanding.   Copied from CRM [id number]. Topic: Clinical - Red Word Triage >> Jun 11, 2023  4:28 PM Irine Seal wrote: [facility] that prompted transfer to Nurse Triage: syncope. Patient stated she fainted yesterday. Patient stated they are currently seeing a specialist. Reason for Disposition  Simple fainting is a chronic symptom (has occurred multiple times)  Answer Assessment - Initial Assessment Questions 1. ONSET: "How long were you  unconscious?" (minutes) "When did it happen?"     Yesterday, Less than 5 minutes 2. CONTENT: "What happened during period of unconsciousness?" (e.g., seizure activity)      Unsure 3. MENTAL STATUS: "Alert and oriented now?" (oriented x 3 = name, month, location)     A&Ox3 4. TRIGGER: "What do you think caused the fainting?" "What were you doing just before you fainted?"  (e.g., exercise, sudden standing up, prolonged standing)     Unsure 5. RECURRENT SYMPTOM: "Have you ever passed out before?" If Yes, ask: "When was the last time?" and "What happened that time?"      "It's been happening for a couple of months. 6. INJURY: "Did you sustain any injury during the fall?"      Hand (scratches) left knee swelling 7. CARDIAC SYMPTOMS: "Have you had any of the following symptoms: chest pain, difficulty breathing, palpitations?"     Denies 8. NEUROLOGIC SYMPTOMS: "Have you had any of the following symptoms: headache, numbness, vertigo, weakness?"     "Legs feel weak when I stand up like I'm gonna fall,"  "Sometimes my hands get numb." 9. GI SYMPTOMS: "Have you had any of the following symptoms: abdomen pain, vomiting, diarrhea, blood in stools?"     Denies 10. OTHER SYMPTOMS: "Do you have any other symptoms?"       Denies  Protocols used: Fainting-A-AH  Reason for Disposition  Simple fainting is a chronic symptom (has occurred multiple times)  Answer Assessment - Initial Assessment Questions 1. ONSET: "How long were you unconscious?" (minutes) "When did it happen?"     Yesterday, less than 5 minutes. "This has been going on for about month and they recently just did a test where I had  to wear a heart monitory and they just can't find anything wrong." 2. CONTENT: "What happened during period of unconsciousness?" (e.g., seizure activity)      Unsure "I just passed out and fell on the concrete and then I woke. I don't have history of seizures." 3. MENTAL STATUS: "Alert and oriented now?" (oriented  x 3 = name, month, location)     A&Ox3 4. TRIGGER: "What do you think caused the fainting?" "What were you doing just before you fainted?"  (e.g., exercise, sudden standing up, prolonged standing)     Unsure "I haven't been eating normally like I've been skipping some meals, but my job is so stressful and I just got approved for leave cause it's just a lot for me at that place." 5. RECURRENT SYMPTOM: "Have you ever passed out before?" If Yes, ask: "When was the last time?" and "What happened that time?"      "It's been happening for a couple of months. 6. INJURY: "Did you sustain any injury during the fall?"      Hand (scratches), left knee swelling 7. CARDIAC SYMPTOMS: "Have you had any of the following symptoms: chest pain, difficulty breathing, palpitations?"     Denies 8. NEUROLOGIC SYMPTOMS: "Have you had any of the following symptoms: headache, numbness, vertigo, weakness?"     "My legs feel weak when I stand up like I'm gonna fall. I probably should have a walker now that you mention it." 9. GI SYMPTOMS: "Have you had any of the following symptoms: abdomen pain, vomiting, diarrhea, blood in stools?"     Denies 10. OTHER SYMPTOMS: "Do you have any other symptoms?"     Denies  Protocols used: Fainting-A-AH

## 2023-06-15 ENCOUNTER — Ambulatory Visit: Payer: 59 | Admitting: Internal Medicine

## 2023-06-15 ENCOUNTER — Encounter: Payer: Self-pay | Admitting: Internal Medicine

## 2023-06-15 ENCOUNTER — Telehealth: Payer: Self-pay | Admitting: Internal Medicine

## 2023-06-15 VITALS — BP 122/80 | HR 74 | Temp 98.0°F | Ht 66.0 in | Wt 309.0 lb

## 2023-06-15 DIAGNOSIS — E034 Atrophy of thyroid (acquired): Secondary | ICD-10-CM | POA: Diagnosis not present

## 2023-06-15 DIAGNOSIS — L0292 Furuncle, unspecified: Secondary | ICD-10-CM

## 2023-06-15 DIAGNOSIS — I10 Essential (primary) hypertension: Secondary | ICD-10-CM | POA: Diagnosis not present

## 2023-06-15 DIAGNOSIS — R55 Syncope and collapse: Secondary | ICD-10-CM

## 2023-06-15 MED ORDER — DOXYCYCLINE HYCLATE 100 MG PO TABS: 100.0000 mg | ORAL_TABLET | Freq: Two times a day (BID) | ORAL | 3 refills | Status: AC

## 2023-06-15 MED ORDER — ZEPBOUND 2.5 MG/0.5ML ~~LOC~~ SOAJ: 2.5000 mg | SUBCUTANEOUS | 3 refills | Status: DC

## 2023-06-15 MED ORDER — HURRYCANE FREEDOM EDITION CANE MISC: 0 refills | Status: DC

## 2023-06-15 NOTE — Assessment & Plan Note (Signed)
Cont on Amlodipine, Losartan  She declined diuretics. Severe obesity Pt declined ref to [facility]

## 2023-06-15 NOTE — Assessment & Plan Note (Signed)
Chronic Cont on Levoxyl

## 2023-06-15 NOTE — Progress Notes (Signed)
Subjective:  Patient ID: Tracy Sampson, female    DOB: 07/04/1955  Age: 68 y.o. MRN: [id number]  CC: Loss of Consciousness (Pt has passed out x2 episodes in past 2 weeks a/w left sided facial changes... Pt also states she has had cyst rupture on the under belly a/w pus and foul odor when cyst rupture.)   HPI Tracy Sampson presents for passing out  again  - last time on Thursday last week taking trash out. She was out for a few seconds... Pt saw Dr Rosemary Holms, had event monitor x 30 d, nl stress test  Pt has passed out x2 episodes in past 2 weeks a/w left sided facial changes... Pt also states she has had cyst rupture on the under belly a/w pus and foul odor when cyst rupture.    Using CPAP q night   Outpatient Medications Prior to Visit  Medication Sig Dispense Refill   amLODipine (NORVASC) 5 MG tablet 1 po qd 90 tablet 3   cholecalciferol (VITAMIN D3) 25 MCG (1000 UNIT) tablet Take 1,000 Units by mouth daily.     Cholecalciferol (VITAMIN D3) 50 MCG (2000 UT) capsule Take 1 capsule (2,000 Units total) by mouth daily. 100 capsule 3   cyclobenzaprine (FLEXERIL) 5 MG tablet Take 1 tablet (5 mg total) by mouth 3 (three) times daily as needed for muscle spasms. 30 tablet 0   diphenoxylate-atropine (LOMOTIL) 2.5-0.025 MG tablet Take 1-2 tablets by mouth 4 (four) times daily as needed for diarrhea or loose stools. 60 tablet 1   dorzolamide-timolol (COSOPT) 22.3-6.8 MG/ML ophthalmic solution Place 1 drop into both eyes 2 (two) times daily.      famotidine (PEPCID) 40 MG tablet Take 1 tablet (40 mg total) by mouth daily. 90 tablet 2   latanoprost (XALATAN) 0.005 % ophthalmic solution Apply to eye.     levothyroxine (EUTHYROX) 150 MCG tablet Take 1 tablet (150 mcg total) by mouth daily before breakfast. 90 tablet 3   losartan (COZAAR) 100 MG tablet TAKE 1 TABLET(100 MG) BY MOUTH DAILY 90 tablet 3   meloxicam (MOBIC) 7.5 MG tablet TAKE 1 TABLET BY MOUTH EVERY DAY 30 tablet 0   oxybutynin  (DITROPAN) 5 MG tablet Take 5 mg by mouth 3 (three) times daily.     potassium chloride (KLOR-CON) 10 MEQ tablet Take 1 tablet (10 mEq total) by mouth daily. 90 tablet 3   sertraline (ZOLOFT) 100 MG tablet TAKE 1 TABLET BY MOUTH EVERY DAY ANNUAL APPT DUE IN JUNE MUST SEE PROVIDER FOR FUTURE REFILLS 90 tablet 1   tolterodine (DETROL LA) 4 MG 24 hr capsule Take 1 capsule (4 mg total) by mouth daily. 60 capsule 11   triamcinolone cream (KENALOG) 0.1 % Apply 1 Application topically 3 (three) times daily. 80 g 3   vitamin B-12 (CYANOCOBALAMIN) 500 MCG tablet Take 500 mcg by mouth daily.     No facility-administered medications prior to visit.    ROS: Review of Systems  Constitutional:  Positive for fatigue and unexpected weight change. Negative for activity change, appetite change and chills.  HENT:  Negative for congestion, mouth sores and sinus pressure.   Eyes:  Negative for visual disturbance.  Respiratory:  Negative for cough and chest tightness.   Cardiovascular:  Negative for chest pain.  Gastrointestinal:  Negative for abdominal pain and nausea.  Genitourinary:  Negative for difficulty urinating, frequency and vaginal pain.  Musculoskeletal:  Negative for back pain and gait problem.  Skin:  Positive for color  change and rash. Negative for pallor.  Neurological:  Positive for syncope. Negative for dizziness, tremors, weakness, numbness and headaches.  Psychiatric/Behavioral:  Negative for confusion, sleep disturbance and suicidal ideas.     Objective:  BP 122/80 (BP Location: Left Arm, Patient Position: Sitting, Cuff Size: Normal)   Pulse 74   Temp 98 F (36.7 C) (Oral)   Ht 5\' 6"  (1.676 m)   Wt (!) 309 lb (140.2 kg)   LMP  (LMP Unknown)   SpO2 95%   BMI 49.87 kg/m   BP Readings from Last 3 Encounters:  06/15/23 122/80  04/12/23 (!) 142/78  02/10/23 110/70    Wt Readings from Last 3 Encounters:  06/15/23 (!) 309 lb (140.2 kg)  05/05/23 (!) 305 lb (138.3 kg)  04/12/23  (!) 305 lb 12.8 oz (138.7 kg)    Physical Exam Constitutional:      General: She is not in acute distress.    Appearance: She is well-developed. She is obese.  HENT:     Head: Normocephalic.     Right Ear: External ear normal.     Left Ear: External ear normal.     Nose: Nose normal.  Eyes:     General:        Right eye: No discharge.        Left eye: No discharge.     Conjunctiva/sclera: Conjunctivae normal.     Pupils: Pupils are equal, round, and reactive to light.  Neck:     Thyroid: No thyromegaly.     Vascular: No JVD.     Trachea: No tracheal deviation.  Cardiovascular:     Rate and Rhythm: Normal rate and regular rhythm.     Heart sounds: Normal heart sounds.  Pulmonary:     Effort: No respiratory distress.     Breath sounds: No stridor. No wheezing.  Abdominal:     General: Bowel sounds are normal. There is no distension.     Palpations: Abdomen is soft. There is no mass.     Tenderness: There is no abdominal tenderness. There is no guarding or rebound.  Musculoskeletal:        General: No tenderness.     Cervical back: Normal range of motion and neck supple. No rigidity.  Lymphadenopathy:     Cervical: No cervical adenopathy.  Skin:    Findings: No erythema or rash.  Neurological:     Mental Status: She is oriented to person, place, and time.     Cranial Nerves: No cranial nerve deficit.     Motor: No abnormal muscle tone.     Coordination: Coordination normal.     Gait: Gait abnormal.     Deep Tendon Reflexes: Reflexes normal.  Psychiatric:        Behavior: Behavior normal.        Thought Content: Thought content normal.        Judgment: Judgment normal.   Fat apron is large, no active lesions/cysts    A total time of 45 minutes was spent preparing to see the patient, reviewing tests, x-rays, operative reports and other medical records.  Also, obtaining history and performing comprehensive physical exam.  Additionally, counseling the patient regarding  the above listed issues.   Finally, documenting clinical information in the health records, coordination of care, educating the patient re syncope, obesity  Lab Results  Component Value Date   WBC 7.3 02/10/2023   HGB 12.9 02/10/2023   HCT 41.0 02/10/2023   PLT 252.0  02/10/2023   GLUCOSE 115 (H) 02/10/2023   CHOL 221 (H) 11/06/2021   TRIG 161.0 (H) 11/06/2021   HDL 45.20 11/06/2021   LDLDIRECT 149.3 10/23/2010   LDLCALC 144 (H) 11/06/2021   ALT 20 02/10/2023   AST 23 02/10/2023   NA 146 (H) 02/10/2023   K 4.0 02/10/2023   CL 109 02/10/2023   CREATININE 0.82 02/10/2023   BUN 16 02/10/2023   CO2 28 02/10/2023   TSH 0.45 02/10/2023   INR 1.1 (H) 09/05/2019   HGBA1C 5.7 09/23/2022    MM 3D SCREENING MAMMOGRAM BILATERAL BREAST Result Date: 05/17/2023 CLINICAL DATA:  Screening. EXAM: DIGITAL SCREENING BILATERAL MAMMOGRAM WITH TOMOSYNTHESIS AND CAD TECHNIQUE: Bilateral screening digital craniocaudal and mediolateral oblique mammograms were obtained. Bilateral screening digital breast tomosynthesis was performed. The images were evaluated with computer-aided detection. COMPARISON:  Previous exam(s). ACR Breast Density Category a: The breasts are almost entirely fatty. FINDINGS: There are no findings suspicious for malignancy. IMPRESSION: No mammographic evidence of malignancy. A result letter of this screening mammogram will be mailed directly to the patient. RECOMMENDATION: Screening mammogram in one year. (Code:SM-B-01Y) BI-RADS CATEGORY  1: Negative. Electronically Signed   By: Frederico Hamman M.D.   On: 05/17/2023 17:49    Assessment & Plan:   Problem List Items Addressed This Visit     Hypothyroidism   Chronic Cont on Levoxyl      OBESITY, MORBID   Severe obesity Pt declined ref to [facility] Zepbound Rx was given      Relevant Medications   tirzepatide (ZEPBOUND) 2.5 MG/0.5ML Pen   Essential hypertension   Cont on Amlodipine, Losartan  She declined  diuretics. Severe obesity Pt declined ref to [facility]      Syncopal episodes - Primary   Passing out  again  - last time on Thursday last week taking trash out. She was out for a few seconds... Pt saw Dr Rosemary Holms, had event monitor x 30 d, nl stress test Head CT ordered On CPAP      Relevant Orders   CT HEAD WO CONTRAST ( )   Boils   Recurrent under fat apron Doxy prn Good hygene         Meds ordered this encounter  Medications   Misc. Devices (HURRYCANE FREEDOM EDITION CANE) MISC    Sig: As dirrected    Dispense:  1 each    Refill:  0    Dx falls   doxycycline (VIBRA-TABS) 100 MG tablet    Sig: Take 1 tablet (100 mg total) by mouth 2 (two) times daily.    Dispense:  14 tablet    Refill:  3    For boils   tirzepatide (ZEPBOUND) 2.5 MG/0.5ML Pen    Sig: Inject 2.5 mg into the skin once a week.    Dispense:  2 mL    Refill:  3    Dx: BMI 51, OSA, OA, HTN      Follow-up: Return in about 6 weeks (around 07/27/2023) for a follow-up visit.  Sonda Primes, MD

## 2023-06-15 NOTE — Assessment & Plan Note (Addendum)
Severe obesity Pt declined ref to [facility] Zepbound Rx was given

## 2023-06-15 NOTE — Assessment & Plan Note (Signed)
Passing out  again  - last time on Thursday last week taking trash out. She was out for a few seconds... Pt saw Dr Rosemary Holms, had event monitor x 30 d, nl stress test Head CT ordered On CPAP

## 2023-06-15 NOTE — Telephone Encounter (Unsigned)
Copied from CRM [id number]. Topic: Clinical - Request for Lab/Test Order >> Jun 15, 2023 12:37 PM Denese Killings wrote: Reason for CRM: Kendal Hymen with [employer] is calling to see if there is a Prior Authorization in for patient's CT scan  and if so she needs that information.

## 2023-06-15 NOTE — Assessment & Plan Note (Signed)
Recurrent under fat apron Doxy prn Good hygene

## 2023-06-16 NOTE — Progress Notes (Signed)
1 episode of rapid heartbeat from bottom chamber of the heart, asymptomatic.  No other arrhythmia noted.  This is unlikely to explain her syncope episode.  Her echocardiogram shows enlarged heart muscle due to hypertension, but no other significant abnormalities noted.  Should she have any recurrence of syncope, I would then recommend loop recorder placement.  Thanks MJP

## 2023-06-17 NOTE — Progress Notes (Signed)
Ok  Thanks MJP

## 2023-06-21 ENCOUNTER — Encounter: Payer: Self-pay | Admitting: Internal Medicine

## 2023-06-21 ENCOUNTER — Ambulatory Visit
Admission: RE | Admit: 2023-06-21 | Discharge: 2023-06-21 | Discharge status: Home / Self Care | Payer: 59 | Source: Ambulatory Visit | Attending: Internal Medicine | Admitting: Internal Medicine

## 2023-06-21 DIAGNOSIS — R55 Syncope and collapse: Secondary | ICD-10-CM

## 2023-06-22 ENCOUNTER — Telehealth: Payer: Self-pay | Admitting: Internal Medicine

## 2023-06-22 NOTE — Telephone Encounter (Signed)
 Copied from CRM [id number]. Topic: General - Other >> Jun 22, 2023 12:47 PM Drema MATSU wrote: Reason for CRM: Patient is calling to follow up on her FMLA paperwork. She stated that if they don't recieve paperwork by tomorrow her job is going to cancel her request and she won't get paid this week. Patient wants to know if Dr. Garald can send information to [employer]. [employer] stated that they have not received anything. Patient is requesting a callback.

## 2023-06-24 NOTE — Telephone Encounter (Signed)
 Copied from CRM [id number]. Topic: General - Other >> Jun 24, 2023  2:52 PM Viola F wrote: Reason for CRM: Patient called to follow up on fmla paperwork, per clinical access line paperwork was faxed over to [employer] today and there is a copy at the office for patient. Patient request if the copy of the paperwork be mailed to her.

## 2023-06-25 ENCOUNTER — Telehealth: Payer: Self-pay | Admitting: Internal Medicine

## 2023-06-25 NOTE — Telephone Encounter (Signed)
 Copied from CRM [id number]. Topic: General - Other >> Jun 25, 2023  2:37 PM Tracy Sampson wrote: Reason for CRM: Patient is calling to inform [employer] has not received the [facility] paper, she is asking if we can refax with a cover letter that has patients complete name and case number : [id number].  Please refax pw

## 2023-06-25 NOTE — Telephone Encounter (Signed)
 Paperwork has been sent out to the pt via Mail.

## 2023-06-25 NOTE — Telephone Encounter (Signed)
 Paper work has been refaxed.

## 2023-06-25 NOTE — Telephone Encounter (Signed)
 Pt knows that her FMLA paperwork was sent to her in the mail but she was also wondering if you guys sent it via fax after completion with a cover letter with the patients name on it if not [employer] will not receive it..  Please advise, Thanks

## 2023-06-25 NOTE — Telephone Encounter (Signed)
 Paperwork has been faxed with pts information, I was able to re-fax paper.

## 2023-06-29 NOTE — Telephone Encounter (Signed)
Copied from CRM [id number]. Topic: Medical Record Request - Records Request >> Jun 29, 2023  1:35 PM Adaysia C wrote: Reason for CRM: Patient called to inform the provider that her insurance did not receive her FMLA paper work that was faxed from the providers office. The patients insurance requested that the provider email or mail the signed documents to [employer](the email and address are listed on the documents). Patient has requested a call back when the provider decides which way they are going to send the [facility] documents. Phone:#[phone]

## 2023-06-29 NOTE — Telephone Encounter (Signed)
FMLA papers will be mailed out to Address on paperwork.

## 2023-06-30 ENCOUNTER — Ambulatory Visit: Payer: 59 | Attending: Cardiology | Admitting: Cardiology

## 2023-06-30 ENCOUNTER — Ambulatory Visit (HOSPITAL_BASED_OUTPATIENT_CLINIC_OR_DEPARTMENT_OTHER): Payer: 59 | Admitting: Pulmonary Disease

## 2023-06-30 ENCOUNTER — Encounter (HOSPITAL_BASED_OUTPATIENT_CLINIC_OR_DEPARTMENT_OTHER): Payer: Self-pay | Admitting: Pulmonary Disease

## 2023-06-30 ENCOUNTER — Encounter: Payer: Self-pay | Admitting: Cardiology

## 2023-06-30 VITALS — BP 169/85 | HR 80 | Resp 16 | Ht 66.0 in | Wt 308.0 lb

## 2023-06-30 VITALS — BP 132/84 | HR 93 | Ht 66.0 in | Wt 305.2 lb

## 2023-06-30 DIAGNOSIS — G4733 Obstructive sleep apnea (adult) (pediatric): Secondary | ICD-10-CM

## 2023-06-30 DIAGNOSIS — I10 Essential (primary) hypertension: Secondary | ICD-10-CM

## 2023-06-30 DIAGNOSIS — R55 Syncope and collapse: Secondary | ICD-10-CM

## 2023-06-30 NOTE — Addendum Note (Signed)
Addended by: Amada Kingfisher on: 06/30/2023 09:40 AM   Modules accepted: Orders

## 2023-06-30 NOTE — Progress Notes (Signed)
Subjective:    Patient ID: Tracy Sampson, female    DOB: 08/18/1955, 68 y.o.   MRN: [id number]  HPI  69  yo obese woman  for FU of obstructive sleep apnea  on autoCPAP. diagnosed in 08/2001 - on auto CPAP settings 8 to 18 cm.    The patient, with a history of sleep apnea and hypertension, presents with ongoing issues with her CPAP machine and new onset fainting episodes. She reports struggling with the CPAP machine, not using it at all some nights, and experiencing significant air leakage from the mask. She also expresses concern about a persistent indentation on her face from the mask.  In addition to the sleep apnea, the patient has been experiencing fainting episodes, the [place] which [place] an airport [place] of the previous year. The most recent episode occurred two weeks prior to this visit. She has undergone numerous tests, including an MRI of the brain and a stress test, all of which have returned normal results. She is currently on Cozaar for hypertension and has noticed a slight increase in her blood pressure.  The patient also reports significant sleep disturbances, stating that she is not sleeping at all at night and is only able to sleep for about two hours during the day. She denies having her days and nights mixed up and suggests that her increased daytime rest due to being on sick leave may be contributing to her insomnia.  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 Considering retirement due to job-related stress and overall health concerns. Currently on sick leave. Discussed potential impact of stress on health and benefits of retirement.    CPAP download was reviewed which shows well-controlled events, CPAP settings 8 to 18 cm with average pressure of 10 and maximum pressure of 11.  Compliance is improved average usage on days  used 5 hours per night and median usage is 5 hours.  She has a large leak in spite of changing to air fit F 30 fullface mask   Significant tests/ events reviewed   04/2001 (240 lbs) >> RDI 57/h, low sat 86% Rpt titration 04/2008- wt 298 lbs >> CPAP 15 cm  Review of Systems neg for any significant sore throat, dysphagia, itching, sneezing, nasal congestion or excess/ purulent secretions, fever, chills, sweats, unintended wt loss, pleuritic or exertional cp, hempoptysis, orthopnea pnd or change in chronic leg swelling. Also denies presyncope, palpitations, heartburn, abdominal pain, nausea, vomiting, diarrhea or change in bowel or urinary habits, dysuria,hematuria, rash, arthralgias, visual complaints, headache, numbness weakness or ataxia.     Objective:   Physical Exam  Gen. Pleasant, obese, in no distress ENT - no lesions, no post nasal drip Neck: No JVD, no thyromegaly, no carotid bruits Lungs: no use of accessory muscles, no dullness to percussion, decreased without rales or rhonchi  Cardiovascular: Rhythm regular, heart sounds  normal, no murmurs or gallops, no peripheral edema Musculoskeletal: No deformities, no cyanosis or clubbing , no tremors       Assessment & Plan:   Obstructive Sleep Apnea Non-compliance with CPAP therapy due to mask fit and pressure settings. Current machine is 69 years old with significant leaks. Reports difficulty sleeping and daytime fatigue. Discussed risks of untreated sleep apnea, including cardiovascular complications and daytime somnolence. Benefits of CPAP therapy include improved sleep quality and reduced cardiovascular risk.  Discussed melatonin as a non-prescription sleep aid alternative. - Send prescription for a new CPAP machine - Lower the pressure settings on the CPAP machine - Recommend trying melatonin for sleep aid - Consider a different type of mask to improve seal and comfort  Syncope Episodes of fainting since July last year. MRI  of the brain and cardiac stress tests were normal. Etiology remains unclear. Discussed potential medication side effects and need for further evaluation. No immediate life-threatening causes identified. - Follow-up with primary care physician and specialists as needed  Hypertension On 100 mg of Cozaar. Blood pressure today is 130/80 mmHg, well-controlled. Previous reading was 120/80 mmHg two weeks ago. Discussed potential for medication adjustment if blood pressure remains stable. - Continue current antihypertensive medication regimen   Follow-up - Follow-up with pulmonologist for CPAP therapy - Follow-up with primary care physician and specialists for syncope and hypertension management.

## 2023-06-30 NOTE — Patient Instructions (Signed)
Follow-Up: At [facility], you and your health needs are our priority.  As part of our continuing mission to provide you with exceptional heart care, we have created designated Provider Care Teams.  These Care Teams include your primary Cardiologist (physician) and Advanced Practice Providers (APPs -  Physician Assistants and Nurse Practitioners) who all work together to provide you with the care you need, when you need it.  We recommend signing up for the patient portal called "MyChart".  Sign up information is provided on this After Visit Summary.  MyChart is used to connect with patients for Virtual Visits (Telemedicine).  Patients are able to view lab/test results, encounter notes, upcoming appointments, etc.  Non-urgent messages can be sent to your provider as well.   To learn more about what you can do with MyChart, go to [email or web address].    Your next appointment:   6 month(s)  Provider:   Elder Negus, MD     Other Instructions Referral to EP for Loop Recorder

## 2023-06-30 NOTE — Patient Instructions (Addendum)
X Rx for replacement  autoCPAP 10-15cm   VISIT SUMMARY:  During today's visit, we discussed your ongoing issues with sleep apnea, recent fainting episodes, hypertension, and arthritis. We reviewed your current treatments and made adjustments to help improve your overall health and well-being.  YOUR PLAN:  -OBSTRUCTIVE SLEEP APNEA: Obstructive sleep apnea is a condition where your breathing repeatedly stops and starts during sleep. We will send a prescription for a new CPAP machine, lower the pressure settings, and recommend trying melatonin as a sleep aid. Additionally, consider using a different type of mask to improve the seal and comfort.  -SYNCOPE: Syncope refers to fainting or passing out. Although your MRI and stress tests were normal, we need to continue monitoring this condition. Please follow up with your primary care physician and specialists as needed.   -GENERAL HEALTH MAINTENANCE: We discussed the potential impact of job-related stress on your health and the benefits of retirement. Consider discussing retirement and stress management strategies with your primary care physician.  INSTRUCTIONS:  New CPAP Rx sent

## 2023-06-30 NOTE — Progress Notes (Signed)
Cardiology Office Note:  .   Date:  06/30/2023  ID:  Tracy Sampson, DOB 07-Nov-1955, MRN [id number] PCP: Tresa Garter, MD   HeartCare Providers Cardiologist:  Truett Mainland, MD PCP: Tresa Garter, MD  Chief Complaint  Patient presents with   Loss of Consciousness   Follow-up      History of Present Illness: .    Tracy Sampson is a 68 y.o. female with hypertension, hypothyroidism, syncope  Patient has continued to have syncopal episodes without any warning signs.  Most recent episode occurred few weeks ago when she was taking trash out.  Prior episodes have occurred, also without any warning signs.  Workup with stress test showed no ischemia, echocardiogram did not show significant abnormalities.  External monitor showed 1 episode of VT, which was not correlated with symptoms.  Separately, she had multiple episodes of symptoms without any syncope, that did not correlate with any arrhythmia.  Vitals:   06/30/23 1420  BP: (!) 169/85  Pulse: 80  Resp: 16  SpO2: 94%   Orthostatic VS for the past 72 hrs (Last 3 readings):  Orthostatic BP Patient Position BP Location Cuff Size Orthostatic Pulse  06/30/23 1435 (!) 170/91 Standing Left Arm -- 90  06/30/23 1434 154/88 Sitting Left Arm Large 80  06/30/23 1429 143/83 Supine Left Arm Large 80  06/30/23 1420 -- Sitting Left Arm Large --      ROS:  Review of Systems  Cardiovascular:  Positive for syncope.     Studies Reviewed: [name]        EKG 06/30/2023: Normal sinus rhythm Possible Left atrial enlargement When compared with ECG of 12-Apr-2023 15:13, No significant change was found   Mobile cardiac outpatient telemetry 15 days 04/2023: Dominant rhythm: Sinus. HR 50-126 bpm. Avg HR 69 bpm. 1 episode of VT, up to 116 bpm for 8 beats. (06/05/2023, 4:30 PM), with no reported symptoms. No other arrhythmia noted. 32 events transmitted, 10 patient triggered, 22 auto triggered.  No arrhythmia noted  to associated with these episodes.   Pharmacological stress test 04/2023: No ischemia or infarction. Stress EF 72%. Low risk study.  Independently interpreted Echocardiogram 02/2023: Moderate LVH.  EF 60 to 65%.  Mildly abnormal global longitudinal strain.  Grade 1 diastolic dysfunction. Calcified aortic valve, possibly bicuspid.  No significant AS/AI.   Physical Exam:   Physical Exam Vitals and nursing note reviewed.  Constitutional:      General: She is not in acute distress.    Appearance: She is obese.  Neck:     Vascular: No JVD.  Cardiovascular:     Rate and Rhythm: Normal rate and regular rhythm.     Heart sounds: Normal heart sounds. No murmur heard. Pulmonary:     Effort: Pulmonary effort is normal.     Breath sounds: Normal breath sounds. No wheezing or rales.  Musculoskeletal:     Right lower leg: No edema.     Left lower leg: No edema.      VISIT DIAGNOSES:   ICD-10-CM   1. Syncope and collapse  R55 EKG 12-Lead    Ambulatory referral to Cardiac Electrophysiology    2. Primary hypertension  I10        ASSESSMENT AND PLAN: .    Tracy Sampson is a 68 y.o. female with hypertension, hypothyroidism, syncope  Syncope: Recurrent episodes of syncope without warning sign, without clear etiology. Workup thus far with echocardiogram and stress test was unremarkable.  Monitor showed 1  episode of 8 beat VT without any symptoms. Given her recurrent syncope, consider repeat evaluation for loop recorder placement. I remain unsure if this would show any arrhythmic etiology, but could at least rule out a clear arrhythmogenic etiology.  Hypertension: Blood pressure elevated today, without any positive orthostatics.  Given her recurrent syncope, and generally well-controlled blood pressure, have not made any change today.       F/u in 6 months  Signed, Elder Negus, MD

## 2023-07-01 ENCOUNTER — Telehealth: Payer: Self-pay | Admitting: *Deleted

## 2023-07-01 NOTE — Telephone Encounter (Signed)
-----   Message from Cassie H sent at 07/01/2023  9:19 AM EST ----- Regarding: RE: REFER TO EP FOR ILR PER PATWARDHAN Attempted to contact patient to schedule ILR Implant/Consult - no answer, lvmtcb. ----- Message ----- From: Loa Socks, LPN Sent: 2/95/6213   5:18 PM EST To: Ronn Melena; Noe Gens; # Subject: REFER TO EP FOR ILR PER PATWARDHAN             Dr. Rosemary Holms saw this pt in clinic today and referred her to EP for consideration of ILR  We sent her to checkout but nobody scheduled  Can you please call the pt and schedule and shoot me the date thereafter?  Thanks, [employer]

## 2023-07-02 NOTE — Telephone Encounter (Signed)
Pt has picked up the paperwork a few days ago, a fax was sent and the paperwork was mailed out.

## 2023-07-02 NOTE — Telephone Encounter (Signed)
Copied from CRM [id number]. Topic: Medical Record Request - Records Request >> Jun 29, 2023  1:35 PM Adaysia C wrote: Reason for CRM: Patient called to inform the provider that her insurance did not receive her FMLA paper work that was faxed from the providers office. The patients insurance requested that the provider email or mail the signed documents to [employer](the email and address are listed on the documents). Patient has requested a call back when the provider decides which way they are going to send the [facility] documents. Phone:#[phone]

## 2023-07-02 NOTE — Telephone Encounter (Signed)
Copied from CRM [id number]. Topic: Medical Record Request - Records Request >> Jul 02, 2023 12:45 PM Tiffany H wrote: [employer] advised that they never received fax. Patient was advised to have Dr. Loren Racer office make the following revisions. Please assist.   Cover page: To [employer] Attn: DLA Admin  Fax: [phone]  Please call patient when the fax has been sent.

## 2023-07-05 NOTE — Telephone Encounter (Signed)
Copied from CRM [id number]. Topic: General - Other >> Jul 05, 2023 12:02 PM Truddie Crumble wrote: Reason for EAV:[id number] would like to be called when the fax has been sent to [facility]

## 2023-07-06 ENCOUNTER — Other Ambulatory Visit (HOSPITAL_COMMUNITY): Payer: Self-pay

## 2023-07-06 ENCOUNTER — Telehealth: Payer: Self-pay | Admitting: Pharmacy Technician

## 2023-07-06 NOTE — Telephone Encounter (Signed)
Pharmacy Patient Advocate Encounter  Received notification from [facility] that Prior Authorization for Zepbound has been APPROVED from 07/06/2023 to 07/05/2024   PA #/Case ID/Reference #: Please see attached approval letter

## 2023-07-06 NOTE — Telephone Encounter (Signed)
Pharmacy Patient Advocate Encounter   Received notification from Fax that prior authorization for Zepbound 2.5MG /0.5ML pen-injectors is required/requested.   Insurance verification completed.   The patient is insured through [facility] .   Per test claim: PA required; PA submitted to above mentioned insurance via CoverMyMeds Key/confirmation #/EOC [id number] Status is pending

## 2023-07-07 ENCOUNTER — Encounter: Payer: Self-pay | Admitting: Obstetrics and Gynecology

## 2023-07-07 ENCOUNTER — Ambulatory Visit (INDEPENDENT_AMBULATORY_CARE_PROVIDER_SITE_OTHER): Payer: 59 | Admitting: Obstetrics and Gynecology

## 2023-07-07 VITALS — BP 130/80 | HR 66 | Ht 65.0 in | Wt 307.0 lb

## 2023-07-07 DIAGNOSIS — Z01419 Encounter for gynecological examination (general) (routine) without abnormal findings: Secondary | ICD-10-CM | POA: Diagnosis not present

## 2023-07-07 DIAGNOSIS — N813 Complete uterovaginal prolapse: Secondary | ICD-10-CM

## 2023-07-07 DIAGNOSIS — D229 Melanocytic nevi, unspecified: Secondary | ICD-10-CM | POA: Diagnosis not present

## 2023-07-07 DIAGNOSIS — Z1231 Encounter for screening mammogram for malignant neoplasm of breast: Secondary | ICD-10-CM

## 2023-07-07 DIAGNOSIS — N3941 Urge incontinence: Secondary | ICD-10-CM

## 2023-07-07 DIAGNOSIS — Z1331 Encounter for screening for depression: Secondary | ICD-10-CM

## 2023-07-07 DIAGNOSIS — K921 Melena: Secondary | ICD-10-CM

## 2023-07-07 DIAGNOSIS — E2839 Other primary ovarian failure: Secondary | ICD-10-CM

## 2023-07-07 NOTE — Progress Notes (Signed)
68 y.o. y.o. female here for annual exam. No LMP recorded (lmp unknown). Patient has had a hysterectomy.    Since starting on the ditropan she still reports incontinence.  Fecal incontinence as well  No LMP recorded (lmp unknown). [facility] 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  The current method of family planning is status post hysterectomy.    Exercising: No.  The patient does not participate in regular exercise at present. Smoker:  no  Health Maintenance: Pap:11/6/20WNL   11/16/2016 WNL NEG HPV, 07-11-14 WNL NEG HR HPV History of abnormal Pap:  no MMG:  03/12/20 density B Bi-rads 1 neg  BMD:   11/21 normal repeat ordered Colonoscopy: 12/19/19 polyps removed. Recommended repeat in 5 years -noticed blood in stool and hemorrhoids referral placed TDaP:  12/04/14  Gardasil: none  Body mass index is 51.09 kg/m.     07/07/2023    9:26 AM 02/10/2023    8:25 AM 09/23/2022    8:15 AM  Depression screen PHQ 2/9  Decreased Interest 0 0 0  Down, Depressed, Hopeless 0 0 0  PHQ - 2 Score 0 0 0    Blood pressure 130/80, pulse 66, height 5\' 5"  (1.651 m), weight (!) 307 lb (139.3 kg), SpO2 98%.     Component Value Date/Time   DIAGPAP  03/24/2019 1114    - Negative for intraepithelial lesion or malignancy (NILM)   DIAGPAP  11/16/2016 0000    NEGATIVE FOR INTRAEPITHELIAL LESIONS OR MALIGNANCY.   ADEQPAP Satisfactory for evaluation. 03/24/2019 1114   ADEQPAP  11/16/2016 0000    Satisfactory for evaluation  endocervical/transformation zone component PRESENT.    GYN HISTORY:    Component Value Date/Time   DIAGPAP  03/24/2019 1114    - Negative for intraepithelial lesion or malignancy (NILM)   DIAGPAP  11/16/2016 0000    NEGATIVE FOR INTRAEPITHELIAL LESIONS OR MALIGNANCY.   ADEQPAP Satisfactory for evaluation. 03/24/2019 1114   ADEQPAP  11/16/2016 0000    Satisfactory for evaluation  endocervical/transformation zone component PRESENT.     OB History  Gravida Para Term Preterm AB Living  0 0 0 0 0 0  SAB IAB Ectopic Multiple Live Births  0 0 0 0     Past Medical History:  Diagnosis Date   Allergic rhinitis    Allergy    Arthritis    Cataract    bilateral removed   Depression    per pt not aware of this   GERD (gastroesophageal reflux disease)    Glaucoma    Hypertension    Hypothyroidism    Morbid obesity (HCC)    OSA (obstructive sleep apnea)    Dr Delford Field   Sleep apnea    wears cpap   Thyroid disease    Urinary incontinence     Past Surgical History:  Procedure Laterality Date   abdominal supracervical hysterectomy with bilateral salpingo-oophorectomy     fibroid uterus   ANKLE SURGERY  4/10   rt   BREAST REDUCTION SURGERY     CATARACT EXTRACTION     COLONOSCOPY     COLONOSCOPY WITH PROPOFOL N/A 12/19/2019   Procedure: COLONOSCOPY WITH PROPOFOL;  Surgeon: Napoleon Form, MD;  Location: [facility] END[facility]COPY;  Service: Endoscopy;  Laterality: N/A;   HERNIA REPAIR  2017   umbillical   HYSTEROSCOPY N/A 07/25/2019   Procedure: HYSTEROSCOPY OF CERVIX WITH ULTRASOUND GUIDANCE/KS;  Surgeon: Romualdo Bolk,  MD;  Location: [facility] OR;  Service: Gynecology;  Laterality: N/A;   INSERTION OF MESH N/A 01/07/2016   Procedure: INSERTION OF MESH;  Surgeon: Abigail Miyamoto, MD;  Location: [facility] ORS;  Service: General;  Laterality: N/A;   OPERATIVE ULTRASOUND N/A 07/25/2019   Procedure: OPERATIVE ULTRASOUND;  Surgeon: Romualdo Bolk, MD;  [facility] OR;  Service: Gynecology;  Laterality: N/A;   POLYPECTOMY  12/19/2019   Procedure: POLYPECTOMY;  Surgeon: Napoleon Form, MD;  Location: [facility] ENDOSCOPY;  Service: Endoscopy;;   REDUCTION MAMMAPLASTY     VENTRAL HERNIA REPAIR N/A 01/07/2016   Procedure: VENTRAL HERNIA REPAIR;  Surgeon: Abigail Miyamoto, MD;  Location: [facility] OR[facility];  Service: General;  Laterality: N/A;    Current Outpatient Medications on File Prior to Visit  Medication Sig Dispense Refill   amLODipine  (NORVASC) 5 MG tablet 1 po qd 90 tablet 3   cholecalciferol (VITAMIN D3) 25 MCG (1000 UNIT) tablet Take 1,000 Units by mouth daily.     cyclobenzaprine (FLEXERIL) 5 MG tablet Take 1 tablet (5 mg total) by mouth 3 (three) times daily as needed for muscle spasms. 30 tablet 0   diphenoxylate-atropine (LOMOTIL) 2.5-0.025 MG tablet Take 1-2 tablets by mouth 4 (four) times daily as needed for diarrhea or loose stools. 60 tablet 1   dorzolamide-timolol (COSOPT) 22.3-6.8 MG/ML ophthalmic solution Place 1 drop into both eyes 2 (two) times daily.      doxycycline (VIBRA-TABS) 100 MG tablet Take 1 tablet (100 mg total) by mouth 2 (two) times daily. 14 tablet 3   latanoprost (XALATAN) 0.005 % ophthalmic solution Apply to eye.     levothyroxine (EUTHYROX) 150 MCG tablet Take 1 tablet (150 mcg total) by mouth daily before breakfast. 90 tablet 3   potassium chloride (KLOR-CON) 10 MEQ tablet Take 1 tablet (10 mEq total) by mouth daily. 90 tablet 3   sertraline (ZOLOFT) 100 MG tablet TAKE 1 TABLET BY MOUTH EVERY DAY ANNUAL APPT DUE IN JUNE MUST SEE PROVIDER FOR FUTURE REFILLS 90 tablet 1   tolterodine (DETROL LA) 4 MG 24 hr capsule Take 1 capsule (4 mg total) by mouth daily. 60 capsule 11   vitamin B-12 (CYANOCOBALAMIN) 500 MCG tablet Take 500 mcg by mouth daily.     famotidine (PEPCID) 40 MG tablet Take 1 tablet (40 mg total) by mouth daily. (Patient not taking: Reported on 07/07/2023) 90 tablet 2   tirzepatide (ZEPBOUND) 2.5 MG/0.5ML Pen Inject 2.5 mg into the skin once a week. (Patient not taking: Reported on 07/07/2023) 2 mL 3   No current facility-administered medications on file prior to visit.    Social History   Socioeconomic History   Marital status: Single    Spouse name: Not on file   Number of children: Not on file   Years of education: Not on file   Highest education level: Associate degree: occupational, Scientist, product/process development, or vocational program  Occupational History   Occupation: [employer]    Comment:  used to work in Clinical biochemist  Tobacco Use   Smoking status: Never    Passive exposure: Never   Smokeless tobacco: Never  Vaping Use   Vaping status: Never Used  Substance and Sexual Activity   Alcohol use: No    Alcohol/week: 0.0 standard drinks of alcohol   Drug use: No   Sexual activity: Not Currently    Partners: Male    Birth control/protection: Surgical    Comment: hysterecotmy  Other Topics Concern   Not on file  Social  History Narrative   Not on file   Social Drivers of Health   Financial Resource Strain: Low Risk  (06/14/2023)   Overall Financial Resource Strain (CARDIA)    Difficulty of Paying Living Expenses: Not very hard  Food Insecurity: No Food Insecurity (06/14/2023)   Hunger Vital Sign    Worried About Running Out of Food in the Last Year: Never true    Ran Out of Food in the Last Year: Never true  Transportation Needs: No Transportation Needs (06/14/2023)   PRAPARE - Administrator, Civil Service (Medical): No    Lack of Transportation (Non-Medical): No  Physical Activity: Unknown (06/14/2023)   Exercise Vital Sign    Days of Exercise per Week: 0 days    Minutes of Exercise per Session: Not on file  Stress: Stress Concern Present (06/14/2023)   [employer] of Occupational Health - Occupational Stress Questionnaire    Feeling of Stress : Very much  Social Connections: Socially Isolated (06/14/2023)   Social Connection and Isolation Panel [NHANES]    Frequency of Communication with Friends and Family: Once a week    Frequency of Social Gatherings with Friends and Family: Never    Attends Religious Services: Never    Database administrator or Organizations: No    Attends Engineer, structural: Not on file    Marital Status: Never married  Catering manager Violence: Not on file    Family History  Problem Relation Age of Onset   Breast cancer Mother    Cancer Mother        breast   Hypertension Mother    Colon cancer Maternal  Grandmother    Cancer Maternal Grandmother        colon     Allergies  Allergen Reactions   Citalopram     Side effects   Tribenzor [Olmesartan-Amlodipine-Hctz]     "It made me sick" UNSPECIFIED REACTION       Patient's last menstrual period was No LMP recorded (lmp unknown). Patient has had a hysterectomy..           Review of Systems Alls systems reviewed and are negative.     Physical Exam Constitutional:      Appearance: Normal appearance.  Genitourinary:     Vulva and urethral meatus normal.     No lesions in the vagina.     Genitourinary Comments: Cervix prolapsed to introitus     Right Labia: No rash, lesions or skin changes.    Left Labia: No lesions, skin changes or rash.    No vaginal discharge or tenderness.     No vaginal prolapse present.    No vaginal atrophy present.     Right Adnexa: not tender and no mass present.    Left Adnexa: not tender and no mass present.    No cervical motion tenderness or discharge.     Uterus is absent.  Breasts:    Right: Normal.     Left: Normal.  HENT:     Head: Normocephalic.  Neck:     Thyroid: No thyroid mass, thyromegaly or thyroid tenderness.  Cardiovascular:     Rate and Rhythm: Normal rate and regular rhythm.     Heart sounds: Normal heart sounds, S1 normal and S2 normal.  Pulmonary:     Effort: Pulmonary effort is normal.     Breath sounds: Normal breath sounds and air entry.  Abdominal:     General: There is no distension.  Palpations: Abdomen is soft. There is no mass.     Tenderness: There is no abdominal tenderness. There is no guarding or rebound.  Musculoskeletal:        General: Normal range of motion.     Cervical back: Full passive range of motion without pain, normal range of motion and neck supple. No tenderness.     Right lower leg: No edema.     Left lower leg: No edema.  Neurological:     Mental Status: She is alert.  Skin:    General: Skin is warm.  Psychiatric:        Mood and  Affect: Mood normal.        Behavior: Behavior normal.        Thought Content: Thought content normal.  Vitals and nursing note reviewed. Exam conducted with a chaperone present.       A:         Well Woman GYN exam                             P:        Pap smear not indicated Encouraged annual mammogram screening Colon cancer screening referral placed today DXA ordered today Labs and immunizations to do with PMD Discussed breast self exams Encouraged healthy lifestyle practices Encouraged Vit D and Calcium  Patient reports darking skin on face and arms and would like mole check-referral to dermatology Patient reports hernia repair 2 years ago with mesh and has odor and discharge in area: encouraged to make follow with primary surgeon. No obvious abscess seen. Procidentia and OAB, SUI: referral to urogyn placed No follow-ups on file.  Earley Favor

## 2023-07-07 NOTE — Telephone Encounter (Signed)
Pts documents have been faxed over to the department x12 days ago. Upon return to the office I will re-fax documents for the 2nd time with the updated info on the cover sheet "Cover page: To [employer] Attn: DLA Admin   Fax: [phone]"

## 2023-07-08 NOTE — Telephone Encounter (Signed)
Copied from CRM [id number]. Topic: General - Other >> Jul 08, 2023  2:06 PM Tracy Sampson wrote: Reason for CRM: Patient called states [employer] reached out to her about paperwork they received and let her know that it was dated incorrectly. States the dates she was out need to be chaged from the 28th to the 24th. She was out on 24th. Can this be updates and sent back to [employer]. Patient would like cope mailed and a call with update. Thank You

## 2023-07-12 NOTE — Telephone Encounter (Unsigned)
 Copied from CRM [id number]. Topic: General - Other >> Jul 08, 2023  2:06 PM Fredrica W wrote: Reason for CRM: Patient called states [employer] reached out to her about paperwork they received and let her know that it was dated incorrectly. States the dates she was out need to be chaged from the 28th to the 24th. She was out on 24th. Can this be updates and sent back to [employer]. Patient would like cope mailed and a call with update. Thank You >> Jul 12, 2023  3:34 PM Irine Seal wrote: Patient called in and I relayed the message verbatim to her that the documents were corrected and faxed in patient verbalized understanding

## 2023-07-12 NOTE — Telephone Encounter (Signed)
 Copied from CRM [id number]. Topic: General - Other >> Jul 12, 2023  1:58 PM Adaysia C wrote: Reason for CRM: Patient has requested for the provider to fax the corrected date of disability for the patients FMLA form to her [employer]. The correct start date for patients disability should be 06/11/2023 not 06/15/2023. Please fax the form with the correct date and the cover page must say:   claim number:[id number] name: DORSIE SETHI date of birth: 1956-04-13 [employer] Faxed to [phone]  Patient has requested a follow up call #[phone] when it has been faxed to her insurance and she also requested a copy of the corrected signed FMLA forms be mailed to her residence:  [address] [place] [address]

## 2023-07-12 NOTE — Telephone Encounter (Addendum)
 The date on paperwork has been updated and re-faxed.  Once I receive fax confirmation that paperwork has went through successfully I will mail pt a copy to her home as requested.

## 2023-07-14 NOTE — Telephone Encounter (Signed)
 Tracy Sampson   07/14/2023  2:14 PM  Patient would like to speak to someone in regard to this - her claim is being denied for 1/24.

## 2023-07-15 ENCOUNTER — Telehealth: Payer: Self-pay | Admitting: Pulmonary Disease

## 2023-07-15 NOTE — Telephone Encounter (Signed)
 Emailed it to [facility] for Dr Vassie Loll to sign

## 2023-07-19 ENCOUNTER — Ambulatory Visit (HOSPITAL_BASED_OUTPATIENT_CLINIC_OR_DEPARTMENT_OTHER)
Admission: RE | Admit: 2023-07-19 | Discharge: 2023-07-19 | Disposition: A | Discharge status: Home / Self Care | Payer: 59 | Source: Ambulatory Visit | Attending: Obstetrics and Gynecology | Admitting: Obstetrics and Gynecology

## 2023-07-19 DIAGNOSIS — Z01419 Encounter for gynecological examination (general) (routine) without abnormal findings: Secondary | ICD-10-CM | POA: Diagnosis present

## 2023-07-19 DIAGNOSIS — E2839 Other primary ovarian failure: Secondary | ICD-10-CM | POA: Diagnosis present

## 2023-07-19 NOTE — Telephone Encounter (Signed)
 Copied from CRM [id number]. Topic: General - Other >> Jul 19, 2023  3:59 PM Martinique E wrote: Reason for CRM: Patient called in and asked if her PCP is able to extend her short term disability by 3 more weeks. Callback number for patient is [phone] to discuss.

## 2023-07-19 NOTE — Telephone Encounter (Signed)
 Forms have been faxed with success result

## 2023-07-19 NOTE — Telephone Encounter (Signed)
 FMLA forms have been faxed over, the date change has been done and re-faxed over. I was able to obtain new paperwork for the pts claim, it was filled out and faxed back as well.

## 2023-07-23 NOTE — Telephone Encounter (Addendum)
 Okay. Thank you.

## 2023-07-23 NOTE — Telephone Encounter (Signed)
 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  Pt has stated she will contact her job to send in a form for this extension.

## 2023-07-27 ENCOUNTER — Ambulatory Visit: Payer: 59 | Admitting: Internal Medicine

## 2023-07-27 ENCOUNTER — Encounter: Payer: Self-pay | Admitting: Internal Medicine

## 2023-07-27 VITALS — BP 130/78 | HR 74 | Temp 98.3°F | Ht 65.0 in | Wt 308.0 lb

## 2023-07-27 DIAGNOSIS — G8929 Other chronic pain: Secondary | ICD-10-CM

## 2023-07-27 DIAGNOSIS — M25562 Pain in left knee: Secondary | ICD-10-CM | POA: Diagnosis not present

## 2023-07-27 DIAGNOSIS — R072 Precordial pain: Secondary | ICD-10-CM

## 2023-07-27 DIAGNOSIS — E559 Vitamin D deficiency, unspecified: Secondary | ICD-10-CM

## 2023-07-27 DIAGNOSIS — R0789 Other chest pain: Secondary | ICD-10-CM

## 2023-07-27 DIAGNOSIS — R55 Syncope and collapse: Secondary | ICD-10-CM | POA: Diagnosis not present

## 2023-07-27 DIAGNOSIS — M25561 Pain in right knee: Secondary | ICD-10-CM | POA: Diagnosis not present

## 2023-07-27 NOTE — Progress Notes (Signed)
 Subjective:  Patient ID: Tracy Sampson, female    DOB: 25-Feb-1956  Age: 68 y.o. MRN: [id number]  CC: Medical Management of Chronic Issues (6 WEEK F/U )   HPI Tracy Sampson presents for syncope - it has resolved off work. Tracy Sampson thinks it was related to stress at work. The w/up was (-) so far  C/o B knee pain   Outpatient Medications Prior to Visit  Medication Sig Dispense Refill   amLODipine (NORVASC) 5 MG tablet 1 po qd 90 tablet 3   cholecalciferol (VITAMIN D3) 25 MCG (1000 UNIT) tablet Take 1,000 Units by mouth daily.     cyclobenzaprine (FLEXERIL) 5 MG tablet Take 1 tablet (5 mg total) by mouth 3 (three) times daily as needed for muscle spasms. 30 tablet 0   diphenoxylate-atropine (LOMOTIL) 2.5-0.025 MG tablet Take 1-2 tablets by mouth 4 (four) times daily as needed for diarrhea or loose stools. 60 tablet 1   dorzolamide-timolol (COSOPT) 22.3-6.8 MG/ML ophthalmic solution Place 1 drop into both eyes 2 (two) times daily.      doxycycline (VIBRA-TABS) 100 MG tablet Take 1 tablet (100 mg total) by mouth 2 (two) times daily. 14 tablet 3   latanoprost (XALATAN) 0.005 % ophthalmic solution Apply to eye.     levothyroxine (EUTHYROX) 150 MCG tablet Take 1 tablet (150 mcg total) by mouth daily before breakfast. 90 tablet 3   potassium chloride (KLOR-CON) 10 MEQ tablet Take 1 tablet (10 mEq total) by mouth daily. 90 tablet 3   sertraline (ZOLOFT) 100 MG tablet TAKE 1 TABLET BY MOUTH EVERY DAY ANNUAL APPT DUE IN JUNE MUST SEE PROVIDER FOR FUTURE REFILLS 90 tablet 1   tolterodine (DETROL LA) 4 MG 24 hr capsule Take 1 capsule (4 mg total) by mouth daily. 60 capsule 11   vitamin B-12 (CYANOCOBALAMIN) 500 MCG tablet Take 500 mcg by mouth daily.     famotidine (PEPCID) 40 MG tablet Take 1 tablet (40 mg total) by mouth daily. (Patient not taking: Reported on 07/27/2023) 90 tablet 2   tirzepatide (ZEPBOUND) 2.5 MG/0.5ML Pen Inject 2.5 mg into the skin once a week. (Patient not taking: Reported  on 07/27/2023) 2 mL 3   No facility-administered medications prior to visit.    ROS: Review of Systems  Constitutional:  Negative for activity change, appetite change, chills, fatigue and unexpected weight change.  HENT:  Negative for congestion, mouth sores and sinus pressure.   Eyes:  Negative for visual disturbance.  Respiratory:  Negative for cough, chest tightness, shortness of breath and wheezing.   Cardiovascular:  Negative for chest pain, palpitations and leg swelling.  Gastrointestinal:  Negative for abdominal pain and nausea.  Genitourinary:  Negative for difficulty urinating, frequency and vaginal pain.  Musculoskeletal:  Positive for arthralgias, back pain and gait problem.  Skin:  Negative for pallor and rash.  Neurological:  Negative for dizziness, tremors, weakness, numbness and headaches.  Psychiatric/Behavioral:  Negative for confusion and sleep disturbance.     Objective:  BP 130/78   Pulse 74   Temp 98.3 F (36.8 C) (Oral)   Ht 5\' 5"  (1.651 m)   Wt (!) 308 lb (139.7 kg)   LMP  (LMP Unknown)   SpO2 96%   BMI 51.25 kg/m   BP Readings from Last 3 Encounters:  07/27/23 130/78  07/07/23 130/80  06/30/23 (!) 169/85    Wt Readings from Last 3 Encounters:  07/27/23 (!) 308 lb (139.7 kg)  07/07/23 (!) 307 lb (139.3 kg)  06/30/23 (!) 308 lb (139.7 kg)    Physical Exam Constitutional:      General: She is not in acute distress.    Appearance: She is well-developed. She is obese.  HENT:     Head: Normocephalic.     Right Ear: External ear normal.     Left Ear: External ear normal.     Nose: Nose normal.  Eyes:     General:        Right eye: No discharge.        Left eye: No discharge.     Conjunctiva/sclera: Conjunctivae normal.     Pupils: Pupils are equal, round, and reactive to light.  Neck:     Thyroid: No thyromegaly.     Vascular: No JVD.     Trachea: No tracheal deviation.  Cardiovascular:     Rate and Rhythm: Normal rate and regular  rhythm.     Heart sounds: Normal heart sounds.  Pulmonary:     Effort: No respiratory distress.     Breath sounds: No stridor. No wheezing.  Abdominal:     General: Bowel sounds are normal. There is no distension.     Palpations: Abdomen is soft. There is no mass.     Tenderness: There is no abdominal tenderness. There is no guarding or rebound.  Musculoskeletal:        General: No tenderness.     Cervical back: Normal range of motion and neck supple. No rigidity.     Right lower leg: No edema.     Left lower leg: No edema.  Lymphadenopathy:     Cervical: No cervical adenopathy.  Skin:    Findings: No erythema or rash.  Neurological:     Cranial Nerves: No cranial nerve deficit.     Motor: No abnormal muscle tone.     Coordination: Coordination normal.     Deep Tendon Reflexes: Reflexes normal.  Psychiatric:        Behavior: Behavior normal.        Thought Content: Thought content normal.        Judgment: Judgment normal.   Bursa anserina bursae w/pain B  Antalgic gait  Form for work was filled out    A total time of 45 minutes was spent preparing to see the patient, reviewing tests, x-rays, operative reports and other medical records.  Also, obtaining history and performing comprehensive physical exam.  Additionally, counseling the patient regarding the above listed issues.   Finally, documenting clinical information in the health records, coordination of care, educating the patient syncope, stress. Form for work was filled out...   Lab Results  Component Value Date   WBC 7.3 02/10/2023   HGB 12.9 02/10/2023   HCT 41.0 02/10/2023   PLT 252.0 02/10/2023   GLUCOSE 115 (H) 02/10/2023   CHOL 221 (H) 11/06/2021   TRIG 161.0 (H) 11/06/2021   HDL 45.20 11/06/2021   LDLDIRECT 149.3 10/23/2010   LDLCALC 144 (H) 11/06/2021   ALT 20 02/10/2023   AST 23 02/10/2023   NA 146 (H) 02/10/2023   K 4.0 02/10/2023   CL 109 02/10/2023   CREATININE 0.82 02/10/2023   BUN 16  02/10/2023   CO2 28 02/10/2023   TSH 0.45 02/10/2023   INR 1.1 (H) 09/05/2019   HGBA1C 5.7 09/23/2022    DG BONE DENSITY (DXA) Result Date: 07/19/2023 EXAM: DUAL X-RAY ABSORPTIOMETRY (DXA) FOR BONE MINERAL DENSITY IMPRESSION: Referring Physician:  Earley Favor Your patient completed a bone mineral  density test using GE Lunar iDXA system (analysis version: 16). Technologist: ALW PATIENT: Name: Libbi, Towner Patient ID: [id number] Birth Date: Jun 13, 1955 Height: 65.0 in. Sex: Female Measured: 07/19/2023 Weight: 307.0 lbs. Indications: Bilateral Ovariectomy, Estrogen Deficiency, Hypothyroid, Hysterectomy, Levothyroxine, Post Menopausal Fractures: Treatments: Calcium, Vitamin D ASSESSMENT: The BMD measured at DualFemur Neck Left is 0.936 g/cm2 with a T-score of -0.7. This patient is considered normal according to World Health Organization [facility]) criteria. Scan quality is limited due to body habitus. Exclusions: L-1 was excluded due to degenerative changes. Site Region Measured Date Measured Age YA BMD Significant CHANGE T-score DualFemur Neck Left  6/19/2022    68.5         -0.7    0.936 g/cm2 AP Spine  L2-L4      6/19[age]2022    68.5         1.7     1.431 g/cm2 DualFemur Total Mean 6/19/2023    68.5         -[age]    0.948 g/cm2 World Health Organization [facility]) criteria for post-menopausal, Caucasian Women: Normal       T-score at or above -1 SD Osteopenia   T-score between -1 and -2.5 SD Osteoporosis T-score at or below -2.5 SD RECOMMENDATION: 1. All patients should optimize calcium and vitamin D intake. 2. Consider FDA approved medical therapies in postmenopausal women and men aged 68 years and older, based on the following: a. A hip or vertebral (clinical or morphometric) fracture b. T-score = -2.5 at the femoral neck or spine after appropriate evaluation to exclude secondary causes c. Low bone mass (T-score between -1.0 and -2.5 at the femoral neck or spine) and a 10- year probability of a hip fracture  = 3% or a 10 year probability of a major osteoporosis-related fracture = 20% based on the [place]-adapted WHO algorithm. 3. Clinician judgement and/or patient preference may indicate treatment for people with10-year fracture probabilities above or below these levels. FOLLOW-UP: Patients with diagnosis of osteoporosis or at high risk for fracture should have regular bone mineral density tests. For patients eligible for Medicare routine testing is allowed once every 2 years. The testing frequency can be increased to one year for patients who have rapidly progressing disease, those who are receiving or discontinuing medical therapy to restore bone mass, or have additional risk factors. I have reviewed this study and agree with the findings. [facility] Electronically Signed   By: Baird Lyons M.D.   On: 07/19/2023 08:55    Assessment & Plan:   Problem List Items Addressed This Visit     Chest pain, atypical   No relapse      Knee pain, bilateral   Bursa anserina bursitis  Blue-Emu cream - use 2-3 times a day; ice. 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 Josetta thinks it was related to stress at work. The w/up was (-) so far Remain off work x 4 more weeks       Vitamin D deficiency   On Vit D         No orders of the defined types were placed in this encounter.  Follow-up: Return in about 4 weeks (around 08/24/2023) for a follow-up visit.  Sonda Primes, MD

## 2023-07-27 NOTE — Assessment & Plan Note (Signed)
 Severe obesity - no change Pt declined ref to [facility] Zepbound Rx was given - too $1000

## 2023-07-27 NOTE — Assessment & Plan Note (Signed)
 On Vit D

## 2023-07-27 NOTE — Assessment & Plan Note (Signed)
 Resolved

## 2023-07-27 NOTE — Assessment & Plan Note (Signed)
No relapse 

## 2023-07-27 NOTE — Patient Instructions (Signed)
 Bursa anserina bursitis  Blue-Emu cream - use 2-3 times a day; ice

## 2023-07-27 NOTE — Assessment & Plan Note (Addendum)
 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 Rokhaya thinks it was related to stress at work. The w/up was (-) so far Remain off work x 4 more weeks

## 2023-07-27 NOTE — Assessment & Plan Note (Addendum)
 Bursa anserina bursitis  Blue-Emu cream - use 2-3 times a day; ice. Get a Hurrycane Will inject if not better

## 2023-07-28 NOTE — Telephone Encounter (Signed)
 Pt took paperwork back with her from her visit yesterday. We do not have this new form as pt has it,

## 2023-07-28 NOTE — Telephone Encounter (Signed)
 Reason for CRM: Patient called in regarding her FMLA paperwork. Stated how the date on the paperwork for "last day of work" was signed for January 28th 2025, but stated that she needs that date changed to January 24th, 2025. Patient stated this could get re-faxed to [employer], fax: [phone]. Claim # [id number]. Please call patient back if PCP was able to correct the date, and if the revision can get faxed over.

## 2023-07-30 NOTE — Telephone Encounter (Signed)
 Patient would like to drop off FMLA paperwork tomorrow for the quick date change. Patient would like to know if Dr. Loren Racer nurse is available to change the date.

## 2023-08-03 ENCOUNTER — Other Ambulatory Visit (HOSPITAL_COMMUNITY): Payer: Self-pay

## 2023-08-09 NOTE — Telephone Encounter (Signed)
 Reason for CRM: Patient states [employer] contacted her in regard to her FMLA paperwork stating they did not receive all the office notes needed - meaning her temp, her weight, her treatment - every detail.

## 2023-08-10 ENCOUNTER — Ambulatory Visit: Payer: 59 | Admitting: Internal Medicine

## 2023-08-11 NOTE — Telephone Encounter (Signed)
 Reason for CRM: Patient called to follow up on [employer] FMLA paperwork, says they did not receive all the office notes needed - meaning her temp, her weight, her treatment, etc. Please fax information to [phone]. On the cover letter please send attention to: [facility], CLAIM # [id number] with patients name and date of birth. Please call patient with an update.

## 2023-08-13 NOTE — Telephone Encounter (Signed)
 Attempted to call patient to inform them that I have since printed and faxed the requested office visit notes to the fax number provided. Did received fax confirmation that it had been sent. Was not able to reach out to anyone on the phone unfortunately ([employer])

## 2023-08-24 ENCOUNTER — Encounter: Payer: Self-pay | Admitting: Internal Medicine

## 2023-08-24 ENCOUNTER — Ambulatory Visit: Admitting: Internal Medicine

## 2023-08-24 VITALS — BP 138/86 | HR 80 | Temp 97.9°F | Ht 65.0 in | Wt 306.0 lb

## 2023-08-24 DIAGNOSIS — M25561 Pain in right knee: Secondary | ICD-10-CM

## 2023-08-24 DIAGNOSIS — M25562 Pain in left knee: Secondary | ICD-10-CM

## 2023-08-24 DIAGNOSIS — E559 Vitamin D deficiency, unspecified: Secondary | ICD-10-CM

## 2023-08-24 DIAGNOSIS — R55 Syncope and collapse: Secondary | ICD-10-CM

## 2023-08-24 DIAGNOSIS — G8929 Other chronic pain: Secondary | ICD-10-CM

## 2023-08-24 DIAGNOSIS — Z566 Other physical and mental strain related to work: Secondary | ICD-10-CM | POA: Insufficient documentation

## 2023-08-24 NOTE — Progress Notes (Signed)
 Subjective:  Patient ID: Tracy Sampson, female    DOB: 06/21/1955  Age: 68 y.o. MRN: [id number]  CC: Follow-up (4 weeks f/u)   HPI Tracy Sampson presents for syncope, stress - much better since she has not been working,  C/o severe knee pain L>>R  Outpatient Medications Prior to Visit  Medication Sig Dispense Refill   amLODipine  (NORVASC ) 5 MG tablet 1 po qd 90 tablet 3   cholecalciferol (VITAMIN D3) 25 MCG (1000 UNIT) tablet Take 1,000 Units by mouth daily.     cyclobenzaprine  (FLEXERIL ) 5 MG tablet Take 1 tablet (5 mg total) by mouth 3 (three) times daily as needed for muscle spasms. 30 tablet 0   diphenoxylate -atropine  (LOMOTIL ) 2.5-0.025 MG tablet Take 1-2 tablets by mouth 4 (four) times daily as needed for diarrhea or loose stools. 60 tablet 1   dorzolamide-timolol (COSOPT) 22.3-6.8 MG/ML ophthalmic solution Place 1 drop into both eyes 2 (two) times daily.      doxycycline  (VIBRA -TABS) 100 MG tablet Take 1 tablet (100 mg total) by mouth 2 (two) times daily. 14 tablet 3   famotidine  (PEPCID ) 40 MG tablet Take 1 tablet (40 mg total) by mouth daily. 90 tablet 2   latanoprost (XALATAN) 0.005 % ophthalmic solution Apply to eye.     levothyroxine  (EUTHYROX ) 150 MCG tablet Take 1 tablet (150 mcg total) by mouth daily before breakfast. 90 tablet 3   potassium chloride  (KLOR-CON ) 10 MEQ tablet Take 1 tablet (10 mEq total) by mouth daily. 90 tablet 3   sertraline  (ZOLOFT ) 100 MG tablet TAKE 1 TABLET BY MOUTH EVERY DAY ANNUAL APPT DUE IN JUNE MUST SEE PROVIDER FOR FUTURE REFILLS 90 tablet 1   tolterodine  (DETROL  LA) 4 MG 24 hr capsule Take 1 capsule (4 mg total) by mouth daily. 60 capsule 11   vitamin B-12 (CYANOCOBALAMIN ) 500 MCG tablet Take 500 mcg by mouth daily.     No facility-administered medications prior to visit.    ROS: Review of Systems  Constitutional:  Negative for activity change, appetite change, chills, fatigue and unexpected weight change.  HENT:  Negative for  congestion, mouth sores and sinus pressure.   Eyes:  Negative for visual disturbance.  Respiratory:  Negative for cough and chest tightness.   Gastrointestinal:  Negative for abdominal pain, diarrhea and nausea.  Genitourinary:  Negative for difficulty urinating, frequency and vaginal pain.  Musculoskeletal:  Positive for arthralgias and gait problem. Negative for back pain.  Skin:  Negative for pallor and rash.  Neurological:  Negative for dizziness, tremors, syncope, weakness, numbness and headaches.  Psychiatric/Behavioral:  Positive for decreased concentration. Negative for confusion, sleep disturbance and suicidal ideas. The patient is nervous/anxious.     Objective:  BP 138/86 (BP Location: Left Arm, Patient Position: Sitting)   Pulse 80   Temp 97.9 F (36.6 C) (Temporal)   Ht 5\' 5"  (1.651 m)   Wt (!) 306 lb (138.8 kg)   LMP  (LMP Unknown)   SpO2 98%   BMI 50.92 kg/m   BP Readings from Last 3 Encounters:  08/24/23 138/86  07/27/23 130/78  07/07/23 130/80    Wt Readings from Last 3 Encounters:  08/24/23 (!) 306 lb (138.8 kg)  07/27/23 (!) 308 lb (139.7 kg)  07/07/23 (!) 307 lb (139.3 kg)    Physical Exam Constitutional:      General: She is not in acute distress.    Appearance: She is well-developed. She is obese.  HENT:     Head:  Normocephalic.     Right Ear: External ear normal.     Left Ear: External ear normal.     Nose: Nose normal.  Eyes:     General:        Right eye: No discharge.        Left eye: No discharge.     Conjunctiva/sclera: Conjunctivae normal.     Pupils: Pupils are equal, round, and reactive to light.  Neck:     Thyroid : No thyromegaly.     Vascular: No JVD.     Trachea: No tracheal deviation.  Cardiovascular:     Rate and Rhythm: Normal rate and regular rhythm.     Heart sounds: Normal heart sounds.  Pulmonary:     Effort: No respiratory distress.     Breath sounds: No stridor. No wheezing.  Abdominal:     General: Bowel sounds  are normal. There is no distension.     Palpations: Abdomen is soft. There is no mass.     Tenderness: There is no abdominal tenderness. There is no guarding or rebound.  Musculoskeletal:        General: No tenderness.     Cervical back: Normal range of motion and neck supple. No rigidity.  Lymphadenopathy:     Cervical: No cervical adenopathy.  Skin:    Findings: No erythema or rash.  Neurological:     Cranial Nerves: No cranial nerve deficit.     Motor: No abnormal muscle tone.     Coordination: Coordination normal.     Deep Tendon Reflexes: Reflexes normal.  Psychiatric:        Behavior: Behavior normal.        Thought Content: Thought content normal.        Judgment: Judgment normal.   B knees - pain w/ROM    A total time of 45 minutes was spent preparing to see the patient, reviewing tests, x-rays and other medical records.  Also, obtaining history and performing comprehensive physical exam.  Additionally, counseling the patient regarding the above listed issues.   Finally, documenting clinical information in the health records, coordination of care, educating the patient. Form for work.   Lab Results  Component Value Date   WBC 7.3 02/10/2023   HGB 12.9 02/10/2023   HCT 41.0 02/10/2023   PLT 252.0 02/10/2023   GLUCOSE 115 (H) 02/10/2023   CHOL 221 (H) 11/06/2021   TRIG 161.0 (H) 11/06/2021   HDL 45.20 11/06/2021   LDLDIRECT 149.3 10/23/2010   LDLCALC 144 (H) 11/06/2021   ALT 20 02/10/2023   AST 23 02/10/2023   NA 146 (H) 02/10/2023   K 4.0 02/10/2023   CL 109 02/10/2023   CREATININE 0.82 02/10/2023   BUN 16 02/10/2023   CO2 28 02/10/2023   TSH 0.45 02/10/2023   INR 1.1 (H) 09/05/2019   HGBA1C 5.7 09/23/2022    DG BONE DENSITY (DXA) Result Date: 07/19/2023 EXAM: DUAL X-RAY ABSORPTIOMETRY (DXA) FOR BONE MINERAL DENSITY IMPRESSION: Referring Physician:  Reinaldo Caras Your patient completed a bone mineral density test using GE Lunar iDXA system (analysis  version: 16). Technologist: ALW PATIENT: Name: Tracy, Sampson Patient ID: [id number] Birth Date: [birth year] Height: 65.0 in. Sex: Female Measured: 07/19/2023 Weight: 307.0 lbs. Indications: Bilateral Ovariectomy, Estrogen Deficiency, Hypothyroid, Hysterectomy, Levothyroxine , Post Menopausal Fractures: Treatments: Calcium, Vitamin D  ASSESSMENT: The BMD measured at DualFemur Neck Left is 0.936 g/cm2 with a T-score of -0.7. This patient is considered normal according to World Health Organization [facility]) criteria.  Scan quality is limited due to body habitus. Exclusions: L-1 was excluded due to degenerative changes. Site Region Measured Date Measured Age YA BMD Significant CHANGE T-score DualFemur Neck Left  6/19/2022    68.5         -0.6    0.936 g/cm2 AP Spine  L2-L4      6/19/2022    68.5         1.7     1.431 g/cm2 DualFemur Total Mean 6/19/2023    68.5         -0.4    0.948 g/cm2 World Health Organization [facility]) criteria for post-menopausal, Caucasian Women: Normal       T-score at or above -1 SD Osteopenia   T-score between -1 and -2.5 SD Osteoporosis T-score at or below -2.5 SD RECOMMENDATION: 1. All patients should optimize calcium and vitamin D  intake. 2. Consider FDA approved medical therapies in postmenopausal women and men aged 66 years and older, based on the following: a. A hip or vertebral (clinical or morphometric) fracture b. T-score = -2.5 at the femoral neck or spine after appropriate evaluation to exclude secondary causes c. Low bone mass (T-score between -1.0 and -2.5 at the femoral neck or spine) and a 10- year probability of a hip fracture = 3% or a 10 year probability of a major osteoporosis-related fracture = 20% based on the [place] -adapted WHO algorithm. 3. Clinician judgement and/or patient preference may indicate treatment for people with10-year fracture probabilities above or below these levels. FOLLOW-UP: Patients with diagnosis of osteoporosis or at high risk for fracture should have regular  bone mineral density tests. For patients eligible for Medicare routine testing is allowed once every 2 years. The testing frequency can be increased to one year for patients who have rapidly progressing disease, those who are receiving or discontinuing medical therapy to restore bone mass, or have additional risk factors. I have reviewed this study and agree with the findings. [facility] Electronically Signed   By: Dina  Arceo M.D.   On: 07/19/2023 08:55    Assessment & Plan:   Problem List Items Addressed This Visit     Vitamin D  deficiency - Primary   On Vit D      OBESITY, MORBID   Discussed wt loss      Syncope and collapse   Off work x 6 more wks RTC 6 wks      Knee pain, bilateral   L>R. Probable OA  Wt loss adviced Ortho ref      Relevant Orders   Ambulatory referral to Orthopedic Surgery   Stress at work   Off work now RTC 6 weeks Psychology ref was offered         No orders of the defined types were placed in this encounter.     Follow-up: Return in about 6 weeks (around 10/05/2023) for a follow-up visit.  Anitra Barn, MD

## 2023-08-24 NOTE — Assessment & Plan Note (Signed)
 On Vit D

## 2023-08-24 NOTE — Assessment & Plan Note (Signed)
 Discussed wt loss

## 2023-08-24 NOTE — Assessment & Plan Note (Addendum)
 L>R. Probable OA  Wt loss adviced Ortho ref

## 2023-08-24 NOTE — Assessment & Plan Note (Signed)
 Off work x 6 more wks RTC 6 wks

## 2023-08-24 NOTE — Assessment & Plan Note (Signed)
 Off work now RTC 6 weeks Psychology ref was offered

## 2023-09-01 ENCOUNTER — Encounter: Payer: Self-pay | Admitting: Physician Assistant

## 2023-09-01 ENCOUNTER — Ambulatory Visit: Admitting: Physician Assistant

## 2023-09-01 ENCOUNTER — Other Ambulatory Visit (INDEPENDENT_AMBULATORY_CARE_PROVIDER_SITE_OTHER): Payer: Self-pay

## 2023-09-01 DIAGNOSIS — M25562 Pain in left knee: Secondary | ICD-10-CM | POA: Diagnosis not present

## 2023-09-01 DIAGNOSIS — G8929 Other chronic pain: Secondary | ICD-10-CM | POA: Diagnosis not present

## 2023-09-01 DIAGNOSIS — M25561 Pain in right knee: Secondary | ICD-10-CM

## 2023-09-01 MED ORDER — LIDOCAINE HCL 1 % IJ SOLN
3.0000 mL | INTRAMUSCULAR | Status: AC | PRN
  Administered 2023-09-01: 3 mL

## 2023-09-01 MED ORDER — MELOXICAM 7.5 MG PO TABS: 7.5000 mg | ORAL_TABLET | Freq: Every day | ORAL | 0 refills | Status: DC

## 2023-09-01 MED ORDER — LIDOCAINE HCL 1 % IJ SOLN
3.0000 mL | INTRAMUSCULAR | Status: AC | PRN
Start: 2023-09-01 — End: 2023-09-01
  Administered 2023-09-01: 3 mL

## 2023-09-01 MED ORDER — METHYLPREDNISOLONE ACETATE 40 MG/ML IJ SUSP
40.0000 mg | INTRAMUSCULAR | Status: AC | PRN
  Administered 2023-09-01: 40 mg via INTRA_ARTICULAR

## 2023-09-01 MED ORDER — METHYLPREDNISOLONE ACETATE 40 MG/ML IJ SUSP
40.0000 mg | INTRAMUSCULAR | Status: AC | PRN
Start: 2023-09-01 — End: 2023-09-01
  Administered 2023-09-01: 40 mg via INTRA_ARTICULAR

## 2023-09-01 NOTE — Progress Notes (Signed)
 Office Visit Note   Patient: Tracy Sampson           Date of Birth: May 17, 1956           MRN: [id number] Visit Date: 09/01/2023              Requested by: Tresa Garter, MD [address] [place],  [place] [address] PCP: Plotnikov, Georgina Quint, MD   Assessment & Plan: Visit Diagnoses:  1. Chronic pain of right knee   2. Chronic pain of left knee     Plan: Pleasant 68 year old woman with a 5-year history of left greater than right knee pain no particular injury she has tried over-the-counter anti-inflammatories as well as topical Voltaren.  Last 3 months she noted increase in her pain.  No injury.  X-rays show advanced degenerative changes of all 3 compartments with bone-on-bone findings in the medial compartment we talked about the natural history of this.  I will switch her to meloxicam she knows not to take other anti-inflammatories with this.  Discussed trying steroid injection she is not diabetic.  Will go forward with this today.  I told her at some point she may need to consider knee replacement and with that goal in mind she would have to look at getting her BMI below 40 she understands this may follow-up as needed  Follow-Up Instructions: No follow-ups on file.   Orders:  Orders Placed This Encounter  Procedures  . XR KNEE 3 VIEW RIGHT  . XR KNEE 3 VIEW LEFT   No orders of the defined types were placed in this encounter.     Procedures: Large Joint Inj: bilateral knee on 09/01/2023 9:13 AM Indications: pain and diagnostic evaluation Details: 25 G 1.5 in needle, anteromedial approach  Arthrogram: No  Medications (Right): 3 mL lidocaine 1 %; 40 mg methylPREDNISolone acetate 40 MG/ML Medications (Left): 3 mL lidocaine 1 %; 40 mg methylPREDNISolone acetate 40 MG/ML Outcome: tolerated well, no immediate complications Procedure, treatment alternatives, risks and benefits explained, specific risks discussed. Consent was given by the patient. Immediately prior to  procedure a time out was called to verify the correct patient, procedure, equipment, support staff and site/side marked as required. Patient was prepped and draped in the usual sterile fashion.     Clinical Data: No additional findings.   Subjective: No chief complaint on file.   HPI patient is a pleasant 68 year old woman comes in today with left greater than right knee pain.  Been going on over 5 years.  She notes that the last 3 months its gotten worse.  She takes Advil as needed.  No recent injury  Review of Systems  All other systems reviewed and are negative.    Objective: Vital Signs: LMP  (LMP Unknown)   Physical Exam Constitutional:      Appearance: Normal appearance.  Pulmonary:     Effort: Pulmonary effort is normal.  Skin:    General: Skin is warm and dry.  Neurological:     General: No focal deficit present.     Mental Status: She is alert and oriented to person, place, and time.  Psychiatric:        Mood and Affect: Mood normal.        Behavior: Behavior normal.    Ortho Exam Bilateral knees no effusion no erythema compartments are soft and compressible she does have a lot of pain especially on the left with range of motion she is neurovascular intact distal pulses are  intact Specialty Comments:  No specialty comments available.  Imaging: No results found.   PMFS History: Patient Active Problem List   Diagnosis Date Noted  . Stress at work 08/24/2023  . Knee pain, bilateral 07/27/2023  . Boils 06/15/2023  . Precordial pain 04/12/2023  . Chest pain, atypical 02/11/2023  . Syncope and collapse 02/10/2023  . IBS (irritable bowel syndrome) 09/23/2022  . Eczema 02/10/2022  . Elevated fasting glucose 04/02/2021  . Traumatic hematoma of forearm 04/02/2021  . Hypokalemia 10/02/2020  . Neck pain 10/02/2020  . Elevated glucose 08/28/2020  . Polyp of transverse colon   . Polyp of descending colon   . Stool incontinence 12/14/2019  . Postmenopausal  bleeding 07/11/2019  . Shoulder pain, right 11/15/2017  . Overactive bladder 11/15/2017  . Contact dermatitis 07/07/2017  . Head congestion 07/07/2017  . Branch retinal vein occlusion of left eye with macular edema 05/06/2017  . CME (cystoid macular edema), left 05/06/2017  . Subconjunctival hemorrhage of right eye 12/04/2016  . Low tension glaucoma of right eye, 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  . Low tension glaucoma of left eye, moderate stage 09/28/2016  . Family history of glaucoma 09/17/2016  . Pseudophakia of both eyes 09/17/2016  . Incarcerated ventral hernia 01/07/2016  . Left ear pain 06/06/2015  . Rectal bleeding 02/06/2015  . Blood in stool 12/04/2014  . Body odor 12/04/2014  . Umbilical hernia 12/26/2013  . Incontinence in female 08/16/2013  . Trigger finger, acquired 08/16/2013  . Well adult exam 01/11/2012  . Nocturia 02/27/2011  . Paresthesia 10/23/2010  . Ventral hernia 03/01/2009  . Vitamin D deficiency 08/28/2008  . KNEE PAIN 08/28/2008  . OBESITY, MORBID 09/23/2007  . Rash and other nonspecific skin eruption 09/07/2007  . Hypothyroidism 05/10/2007  . Depression 05/03/2007  . Primary hypertension 05/03/2007  . Allergic rhinitis 05/03/2007  . GERD (gastroesophageal reflux disease) 05/03/2007  . OSA on CPAP 05/03/2007  . SUBMUCOUS LEIOMYOMA OF UTERUS 05/02/2007  . GOITER, MULTINODULAR 05/02/2007  . Dysphagia 05/02/2007   Past Medical History:  Diagnosis Date  . Allergic rhinitis   . Allergy   . Arthritis   . Cataract    bilateral removed  . Depression    per pt not aware of this  . GERD (gastroesophageal reflux disease)   . Glaucoma   . Hypertension   . Hypothyroidism   . Morbid obesity (HCC)   . OSA (obstructive sleep apnea)    Dr Delford Field  . Sleep apnea    wears cpap  . Thyroid disease   . Urinary incontinence     Family History  Problem Relation Age of Onset  . Breast cancer Mother   . Cancer Mother        breast  . Hypertension Mother   .  Colon cancer Maternal Grandmother   . Cancer Maternal Grandmother        colon    Past Surgical History:  Procedure Laterality Date  . abdominal supracervical hysterectomy with bilateral salpingo-oophorectomy     fibroid uterus  . ANKLE SURGERY  4/10   rt  . BREAST REDUCTION SURGERY    . CATARACT EXTRACTION    . COLONOSCOPY    . COLONOSCOPY WITH PROPOFOL N/A 12/19/2019   Procedure: COLONOSCOPY WITH PROPOFOL;  Surgeon: Napoleon Form, MD;  Location: [facility];  Service: Endoscopy;  Laterality: N/A;  . HERNIA REPAIR  2017   umbillical  . HYSTEROSCOPY N/A 07/25/2019   Procedure: HYSTEROSCOPY OF CERVIX WITH ULTRASOUND GUIDANCE/KS;  Surgeon:  Jertson, Jill Evelyn, MD;  Location: [facility] OR;  Service: Gynecology;  Laterality: N/A;  . INSERTION OF MESH N/A 01/07/2016   Procedure: INSERTION OF MESH;  Surgeon: Oza Blumenthal, MD;  Location: [facility] ORS;  Service: General;  Laterality: N/A;  . OPERATIVE ULTRASOUND N/A 07/25/2019   Procedure: OPERATIVE ULTRASOUND;  Surgeon: Wanita Gutta, MD;  Location: [facility] OR;  Service: Gynecology;  Laterality: N/A;  . POLYPECTOMY  12/19/2019   Procedure: POLYPECTOMY;  Surgeon: Sergio Dandy, MD;  Location: [facility];  Service: Endoscopy;;  . REDUCTION MAMMAPLASTY    . VENTRAL HERNIA REPAIR N/A 01/07/2016   Procedure: VENTRAL HERNIA REPAIR;  Surgeon: Oza Blumenthal, MD;  Location: [facility];  Service: General;  Laterality: N/A;   Social History   Occupational History  . 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 Smoking status: Never    Passive exposure: Never  . Smokeless tobacco: Never  Vaping Use  . Vaping status: Never Used  Substance and Sexual Activity  . Alcohol use: No    Alcohol/week: 0.0 standard drinks of alcohol  . Drug use: No  . Sexual activity: Not Currently    Partners: Male    Birth control/protection: Surgical    Comment: hysterecotmy

## 2023-09-05 ENCOUNTER — Encounter: Payer: Self-pay | Admitting: Internal Medicine

## 2023-09-29 ENCOUNTER — Other Ambulatory Visit: Payer: Self-pay | Admitting: Physician Assistant

## 2023-10-05 ENCOUNTER — Encounter: Payer: Self-pay | Admitting: Internal Medicine

## 2023-10-05 ENCOUNTER — Ambulatory Visit: Admitting: Internal Medicine

## 2023-10-05 VITALS — BP 130/85 | HR 66 | Temp 97.7°F | Ht 65.0 in | Wt 304.4 lb

## 2023-10-05 DIAGNOSIS — E034 Atrophy of thyroid (acquired): Secondary | ICD-10-CM

## 2023-10-05 DIAGNOSIS — Z566 Other physical and mental strain related to work: Secondary | ICD-10-CM

## 2023-10-05 DIAGNOSIS — R55 Syncope and collapse: Secondary | ICD-10-CM

## 2023-10-05 DIAGNOSIS — I10 Essential (primary) hypertension: Secondary | ICD-10-CM | POA: Diagnosis not present

## 2023-10-05 DIAGNOSIS — E559 Vitamin D deficiency, unspecified: Secondary | ICD-10-CM

## 2023-10-05 DIAGNOSIS — R7309 Other abnormal glucose: Secondary | ICD-10-CM

## 2023-10-05 NOTE — Assessment & Plan Note (Addendum)
 No relapse Remain off work x6 wks I advised Tracy Sampson not to return to work due to stress at work

## 2023-10-05 NOTE — Assessment & Plan Note (Addendum)
 Remain off work x6 wks I advised Ova not to return to work due to stress at work

## 2023-10-05 NOTE — Assessment & Plan Note (Signed)
 Cont on Amlodipine, Losartan  She declined diuretics. Severe obesity Pt declined ref to [facility]

## 2023-10-05 NOTE — Progress Notes (Signed)
 Subjective:  Patient ID: Tracy Sampson, female    DOB: 04/22/1956  Age: 68 y.o. MRN: [id number]  CC: Medical Management of Chronic Issues (6 week follow up. Has not had any more fainting episodes. Patient followed up with specialist for knee pain. Diagnosed with arthritis in both knees. Was administered injections (lidocaine  and methylprednisolone ) in both knees to help with the pain. This had helped up until this week.)   HPI Tracy Sampson presents for syncope - no relapse; HTN, OA  6 week follow up. Has not had any more fainting episodes. Patient followed up with specialist for knee pain. Diagnosed with arthritis in both knees. Was administered injections (lidocaine  and methylprednisolone ) in both knees to help with the pain. This had helped up until this week.   Outpatient Medications Prior to Visit  Medication Sig Dispense Refill   amLODipine  (NORVASC ) 5 MG tablet 1 po qd 90 tablet 3   cholecalciferol (VITAMIN D3) 25 MCG (1000 UNIT) tablet Take 1,000 Units by mouth daily.     cyclobenzaprine  (FLEXERIL ) 5 MG tablet Take 1 tablet (5 mg total) by mouth 3 (three) times daily as needed for muscle spasms. 30 tablet 0   diphenoxylate -atropine  (LOMOTIL ) 2.5-0.025 MG tablet Take 1-2 tablets by mouth 4 (four) times daily as needed for diarrhea or loose stools. 60 tablet 1   dorzolamide-timolol (COSOPT) 22.3-6.8 MG/ML ophthalmic solution Place 1 drop into both eyes 2 (two) times daily.      famotidine  (PEPCID ) 40 MG tablet Take 1 tablet (40 mg total) by mouth daily. 90 tablet 2   latanoprost (XALATAN) 0.005 % ophthalmic solution Apply to eye.     levothyroxine  (EUTHYROX ) 150 MCG tablet Take 1 tablet (150 mcg total) by mouth daily before breakfast. 90 tablet 3   meloxicam  (MOBIC ) 7.5 MG tablet TAKE 1 TABLET BY MOUTH EVERY DAY 30 tablet 0   potassium chloride  (KLOR-CON ) 10 MEQ tablet Take 1 tablet (10 mEq total) by mouth daily. 90 tablet 3   sertraline  (ZOLOFT ) 100 MG tablet TAKE 1 TABLET BY  MOUTH EVERY DAY ANNUAL APPT DUE IN JUNE MUST SEE PROVIDER FOR FUTURE REFILLS 90 tablet 1   tolterodine  (DETROL  LA) 4 MG 24 hr capsule Take 1 capsule (4 mg total) by mouth daily. 60 capsule 11   vitamin B-12 (CYANOCOBALAMIN ) 500 MCG tablet Take 500 mcg by mouth daily.     doxycycline  (VIBRA -TABS) 100 MG tablet Take 1 tablet (100 mg total) by mouth 2 (two) times daily. (Patient not taking: Reported on 10/05/2023) 14 tablet 3   No facility-administered medications prior to visit.    ROS: Review of Systems  Constitutional:  Negative for activity change, appetite change, chills, fatigue and unexpected weight change.  HENT:  Negative for congestion, mouth sores and sinus pressure.   Eyes:  Negative for visual disturbance.  Respiratory:  Negative for cough and chest tightness.   Cardiovascular:  Negative for chest pain and leg swelling.  Gastrointestinal:  Negative for abdominal pain and nausea.  Genitourinary:  Negative for difficulty urinating, frequency and vaginal pain.  Musculoskeletal:  Positive for arthralgias and gait problem. Negative for back pain.  Skin:  Negative for pallor and rash.  Neurological:  Negative for dizziness, tremors, seizures, syncope, weakness, numbness and headaches.  Hematological:  Does not bruise/bleed easily.  Psychiatric/Behavioral:  Negative for confusion, sleep disturbance and suicidal ideas.     Objective:  BP 130/85   Pulse 66   Temp 97.7 F (36.5 C)   Ht 5'  5" (1.651 m)   Wt (!) 304 lb 6.4 oz (138.1 kg)   LMP  (LMP Unknown)   SpO2 96%   BMI 50.65 kg/m   BP Readings from Last 3 Encounters:  10/05/23 130/85  08/24/23 138/86  07/27/23 130/78    Wt Readings from Last 3 Encounters:  10/05/23 (!) 304 lb 6.4 oz (138.1 kg)  08/24/23 (!) 306 lb (138.8 kg)  07/27/23 (!) 308 lb (139.7 kg)    Physical Exam Constitutional:      General: She is not in acute distress.    Appearance: She is well-developed. She is obese.  HENT:     Head:  Normocephalic.     Right Ear: External ear normal.     Left Ear: External ear normal.     Nose: Nose normal.  Eyes:     General:        Right eye: No discharge.        Left eye: No discharge.     Conjunctiva/sclera: Conjunctivae normal.     Pupils: Pupils are equal, round, and reactive to light.  Neck:     Thyroid : No thyromegaly.     Vascular: No JVD.     Trachea: No tracheal deviation.  Cardiovascular:     Rate and Rhythm: Normal rate and regular rhythm.     Heart sounds: Normal heart sounds.  Pulmonary:     Effort: No respiratory distress.     Breath sounds: No stridor. No wheezing.  Abdominal:     General: Bowel sounds are normal. There is no distension.     Palpations: Abdomen is soft. There is no mass.     Tenderness: There is no abdominal tenderness. There is no guarding or rebound.  Musculoskeletal:        General: No tenderness.     Cervical back: Normal range of motion and neck supple. No rigidity.     Right lower leg: No edema.     Left lower leg: No edema.  Lymphadenopathy:     Cervical: No cervical adenopathy.  Skin:    Findings: No erythema or rash.  Neurological:     Cranial Nerves: No cranial nerve deficit.     Motor: No abnormal muscle tone.     Coordination: Coordination normal.     Deep Tendon Reflexes: Reflexes normal.  Psychiatric:        Behavior: Behavior normal.        Thought Content: Thought content normal.        Judgment: Judgment normal.   No edema B  Lab Results  Component Value Date   WBC 7.3 02/10/2023   HGB 12.9 02/10/2023   HCT 41.0 02/10/2023   PLT 252.0 02/10/2023   GLUCOSE 115 (H) 02/10/2023   CHOL 221 (H) 11/06/2021   TRIG 161.0 (H) 11/06/2021   HDL 45.20 11/06/2021   LDLDIRECT 149.3 10/23/2010   LDLCALC 144 (H) 11/06/2021   ALT 20 02/10/2023   AST 23 02/10/2023   NA 146 (H) 02/10/2023   K 4.0 02/10/2023   CL 109 02/10/2023   CREATININE 0.82 02/10/2023   BUN 16 02/10/2023   CO2 28 02/10/2023   TSH 0.45 02/10/2023    INR 1.1 (H) 09/05/2019   HGBA1C 5.7 09/23/2022    DG BONE DENSITY (DXA) Result Date: 07/19/2023 EXAM: DUAL X-RAY ABSORPTIOMETRY (DXA) FOR BONE MINERAL DENSITY IMPRESSION: Referring Physician:  Reinaldo Caras Your patient completed a bone mineral density test using GE Lunar iDXA system (analysis version: 16).  Technologist: ALW PATIENT: Name: Vetta, Couzens Patient ID: [id number] Birth Date: Aug 20, 1955 Height: 65.0 in. Sex: Female Measured: 07/19/2023 Weight: 307.0 lbs. Indications: Bilateral Ovariectomy, Estrogen Deficiency, Hypothyroid, Hysterectomy, Levothyroxine , Post Menopausal Fractures: Treatments: Calcium, Vitamin D  ASSESSMENT: The BMD measured at DualFemur Neck Left is 0.936 g/cm2 with a T-score of -0.7. This patient is considered normal according to World Health Organization [facility]) criteria. Scan quality is limited due to body habitus. Exclusions: L-1 was excluded due to degenerative changes. Site Region Measured Date Measured Age YA BMD Significant CHANGE T-score DualFemur Neck Left  6/19/2022    68.5         -0.6    0.936 g/cm2 AP Spine  L2-L4      07/19/2023    68.5         1.7     1.431 g/cm2 DualFemur Total Mean 07/19/2023    68.5         -0.5    0.948 g/cm2 World Health Organization [facility]) criteria for post-menopausal, Caucasian Women: Normal       T-score at or above -1 SD Osteopenia   T-score between -1 and -2.5 SD Osteoporosis T-score at or below -2.5 SD RECOMMENDATION: 1. All patients should optimize calcium and vitamin D  intake. 2. Consider FDA approved medical therapies in postmenopausal women and men aged 64 years and older, based on the following: a. A hip or vertebral (clinical or morphometric) fracture b. T-score = -2.5 at the femoral neck or spine after appropriate evaluation to exclude secondary causes c. Low bone mass (T-score between -1.0 and -2.5 at the femoral neck or spine) and a 10- year probability of a hip fracture = 3% or a 10 year probability of a major  osteoporosis-related fracture = 20% based on the [place] -adapted WHO algorithm. 3. Clinician judgement and/or patient preference may indicate treatment for people with10-year fracture probabilities above or below these levels. FOLLOW-UP: Patients with diagnosis of osteoporosis or at high risk for fracture should have regular bone mineral density tests. For patients eligible for Medicare routine testing is allowed once every 2 years. The testing frequency can be increased to one year for patients who have rapidly progressing disease, those who are receiving or discontinuing medical therapy to restore bone mass, or have additional risk factors. I have reviewed this study and agree with the findings. [facility] Electronically Signed   By: Dina  Arceo M.D.   On: 07/19/2023 08:55    Assessment & Plan:   Problem List Items Addressed This Visit     Hypothyroidism   Chronic Cont on Levoxyl       Vitamin D  deficiency - Primary   On Vit D      Primary hypertension   Cont on Amlodipine , Losartan   She declined diuretics. Severe obesity Pt declined ref to [facility]      Elevated glucose   Monitor A1c      Syncope and collapse   No relapse Remain off work x6 wks I advised Nikola not to return to work due to stress at work      Stress at work   Remain off work x6 wks I advised Kiyara not to return to work due to stress at work         No orders of the defined types were placed in this encounter.     Follow-up: Return in about 6 weeks (around 11/16/2023) for a follow-up visit.  Anitra Barn, MD

## 2023-10-05 NOTE — Assessment & Plan Note (Signed)
 On Vit D

## 2023-10-05 NOTE — Assessment & Plan Note (Signed)
Monitor A1c 

## 2023-10-05 NOTE — Assessment & Plan Note (Signed)
 Chronic Cont on Levoxyl

## 2023-10-07 ENCOUNTER — Other Ambulatory Visit: Payer: Self-pay | Admitting: Internal Medicine

## 2023-10-07 ENCOUNTER — Telehealth: Payer: Self-pay

## 2023-10-07 NOTE — Telephone Encounter (Signed)
 FMLA forms received, filled out, and faxed with confirmation

## 2023-10-08 ENCOUNTER — Other Ambulatory Visit: Payer: Self-pay | Admitting: Internal Medicine

## 2023-10-22 ENCOUNTER — Telehealth: Payer: Self-pay | Admitting: Internal Medicine

## 2023-10-22 NOTE — Telephone Encounter (Signed)
 Copied from CRM [id number]. Topic: General - Other >> Oct 22, 2023  3:34 PM Turkey A wrote: Reason for CRM: Ova Bloomer with [facility]  called if there could be a review of the ICD code for the appointment 10/05/23- [phone] Provider Services Number

## 2023-10-29 ENCOUNTER — Telehealth: Payer: Self-pay | Admitting: Internal Medicine

## 2023-10-29 NOTE — Telephone Encounter (Signed)
 Copied from CRM [id number]. Topic: General - Other >> Oct 29, 2023  1:11 PM Tracy Sampson wrote: Reason for CRM: Patient called in wanting to speak to Dr. Tami Falcon nurse regarding her claim being submitted to [facility]. Please [phone]

## 2023-10-29 NOTE — Telephone Encounter (Signed)
 Pt states she that  [employer] denied her claim informing her that she needs to return to work unless the is updated information as to her need to be out of work.

## 2023-11-03 ENCOUNTER — Other Ambulatory Visit: Payer: Self-pay | Admitting: Internal Medicine

## 2023-11-15 ENCOUNTER — Other Ambulatory Visit: Payer: Self-pay | Admitting: Internal Medicine

## 2023-11-15 ENCOUNTER — Encounter: Payer: Self-pay | Admitting: Internal Medicine

## 2023-11-15 ENCOUNTER — Ambulatory Visit: Payer: Medicare (Managed Care) | Admitting: Internal Medicine

## 2023-11-15 VITALS — BP 120/82 | HR 71 | Temp 98.7°F | Ht 65.0 in | Wt 303.0 lb

## 2023-11-15 DIAGNOSIS — I10 Essential (primary) hypertension: Secondary | ICD-10-CM | POA: Diagnosis not present

## 2023-11-15 DIAGNOSIS — E559 Vitamin D deficiency, unspecified: Secondary | ICD-10-CM

## 2023-11-15 DIAGNOSIS — Z566 Other physical and mental strain related to work: Secondary | ICD-10-CM

## 2023-11-15 DIAGNOSIS — G4733 Obstructive sleep apnea (adult) (pediatric): Secondary | ICD-10-CM

## 2023-11-15 DIAGNOSIS — E034 Atrophy of thyroid (acquired): Secondary | ICD-10-CM | POA: Diagnosis not present

## 2023-11-15 DIAGNOSIS — R55 Syncope and collapse: Secondary | ICD-10-CM | POA: Diagnosis not present

## 2023-11-15 MED ORDER — AMLODIPINE BESYLATE 5 MG PO TABS: ORAL_TABLET | ORAL | 3 refills | Status: DC

## 2023-11-15 NOTE — Assessment & Plan Note (Signed)
 On Vit D

## 2023-11-15 NOTE — Assessment & Plan Note (Signed)
 Wt Readings from Last 3 Encounters:  11/15/23 (!) 303 lb (137.4 kg)  10/05/23 (!) 304 lb 6.4 oz (138.1 kg)  08/24/23 (!) 306 lb (138.8 kg)

## 2023-11-15 NOTE — Assessment & Plan Note (Signed)
 No relapse Will continue to watch Hydrate well Avoid stress

## 2023-11-15 NOTE — Progress Notes (Signed)
 Subjective:  Patient ID: Tracy Sampson, female    DOB: 01/11/1956  Age: 68 y.o. MRN: [id number]  CC: Medical Management of Chronic Issues (6 week follow up)   HPI ROXANE PUERTO presents for syncope, HTN, LBP She has been unable to work  Outpatient Medications Prior to Visit  Medication Sig Dispense Refill   amLODipine  (NORVASC ) 5 MG tablet 1 po qd 90 tablet 3   cholecalciferol (VITAMIN D3) 25 MCG (1000 UNIT) tablet Take 1,000 Units by mouth daily.     cyclobenzaprine  (FLEXERIL ) 5 MG tablet Take 1 tablet (5 mg total) by mouth 3 (three) times daily as needed for muscle spasms. 30 tablet 0   diphenoxylate -atropine  (LOMOTIL ) 2.5-0.025 MG tablet Take 1-2 tablets by mouth 4 (four) times daily as needed for diarrhea or loose stools. 60 tablet 1   dorzolamide-timolol (COSOPT) 22.3-6.8 MG/ML ophthalmic solution Place 1 drop into both eyes 2 (two) times daily.      doxycycline  (VIBRA -TABS) 100 MG tablet Take 1 tablet (100 mg total) by mouth 2 (two) times daily. 14 tablet 3   famotidine  (PEPCID ) 40 MG tablet Take 1 tablet (40 mg total) by mouth daily. 90 tablet 2   latanoprost (XALATAN) 0.005 % ophthalmic solution Apply to eye.     levothyroxine  (SYNTHROID ) 150 MCG tablet TAKE 1 TABLET BY MOUTH DAILY BEFORE BREAKFAST. 90 tablet 3   meloxicam  (MOBIC ) 7.5 MG tablet TAKE 1 TABLET BY MOUTH EVERY DAY 30 tablet 0   potassium chloride  (KLOR-CON ) 10 MEQ tablet TAKE 1 TABLET BY MOUTH EVERY DAY 90 tablet 3   sertraline  (ZOLOFT ) 100 MG tablet TAKE 1 AND 1/2 TABLETS DAILY BY MOUTH 135 tablet 3   tolterodine  (DETROL  LA) 4 MG 24 hr capsule Take 1 capsule (4 mg total) by mouth daily. 60 capsule 11   vitamin B-12 (CYANOCOBALAMIN ) 500 MCG tablet Take 500 mcg by mouth daily.     No facility-administered medications prior to visit.    ROS: Review of Systems  Constitutional:  Positive for fatigue. Negative for activity change, appetite change, chills and unexpected weight change.  HENT:  Negative for  congestion, mouth sores and sinus pressure.   Eyes:  Negative for visual disturbance.  Respiratory:  Negative for cough and chest tightness.   Gastrointestinal:  Negative for abdominal pain and nausea.  Genitourinary:  Negative for difficulty urinating, frequency and vaginal pain.  Musculoskeletal:  Negative for back pain and gait problem.  Skin:  Negative for pallor and rash.  Neurological:  Negative for dizziness, tremors, weakness, numbness and headaches.  Psychiatric/Behavioral:  Negative for confusion and sleep disturbance.     Objective:  BP 120/82   Pulse 71   Temp 98.7 F (37.1 C)   Ht 5' 5 (1.651 m)   Wt (!) 303 lb (137.4 kg)   LMP  (LMP Unknown)   SpO2 96%   BMI 50.42 kg/m   BP Readings from Last 3 Encounters:  11/15/23 120/82  10/05/23 130/85  08/24/23 138/86    Wt Readings from Last 3 Encounters:  11/15/23 (!) 303 lb (137.4 kg)  10/05/23 (!) 304 lb 6.4 oz (138.1 kg)  08/24/23 (!) 306 lb (138.8 kg)    Physical Exam Constitutional:      General: She is not in acute distress.    Appearance: She is well-developed. She is obese.  HENT:     Head: Normocephalic.     Right Ear: External ear normal.     Left Ear: External ear normal.  Nose: Nose normal.   Eyes:     General:        Right eye: No discharge.        Left eye: No discharge.     Conjunctiva/sclera: Conjunctivae normal.     Pupils: Pupils are equal, round, and reactive to light.   Neck:     Thyroid : No thyromegaly.     Vascular: No JVD.     Trachea: No tracheal deviation.   Cardiovascular:     Rate and Rhythm: Normal rate and regular rhythm.     Heart sounds: Normal heart sounds.  Pulmonary:     Effort: No respiratory distress.     Breath sounds: No stridor. No wheezing.  Abdominal:     General: Bowel sounds are normal. There is no distension.     Palpations: Abdomen is soft. There is no mass.     Tenderness: There is no abdominal tenderness. There is no guarding or rebound.    Musculoskeletal:        General: No tenderness.     Cervical back: Normal range of motion and neck supple. No rigidity.  Lymphadenopathy:     Cervical: No cervical adenopathy.   Skin:    Findings: No erythema or rash.   Neurological:     Mental Status: She is oriented to person, place, and time.     Cranial Nerves: No cranial nerve deficit.     Motor: No abnormal muscle tone.     Coordination: Coordination normal.     Deep Tendon Reflexes: Reflexes normal.   Psychiatric:        Behavior: Behavior normal.        Thought Content: Thought content normal.        Judgment: Judgment normal.     Lab Results  Component Value Date   WBC 7.3 02/10/2023   HGB 12.9 02/10/2023   HCT 41.0 02/10/2023   PLT 252.0 02/10/2023   GLUCOSE 115 (H) 02/10/2023   CHOL 221 (H) 11/06/2021   TRIG 161.0 (H) 11/06/2021   HDL 45.20 11/06/2021   LDLDIRECT 149.3 10/23/2010   LDLCALC 144 (H) 11/06/2021   ALT 20 02/10/2023   AST 23 02/10/2023   NA 146 (H) 02/10/2023   K 4.0 02/10/2023   CL 109 02/10/2023   CREATININE 0.82 02/10/2023   BUN 16 02/10/2023   CO2 28 02/10/2023   TSH 0.45 02/10/2023   INR 1.1 (H) 09/05/2019   HGBA1C 5.7 09/23/2022    DG BONE DENSITY (DXA) Result Date: 07/19/2023 EXAM: DUAL X-RAY ABSORPTIOMETRY (DXA) FOR BONE MINERAL DENSITY IMPRESSION: Referring Physician:  ALMARIE MARLA CARPEN Your patient completed a bone mineral density test using GE Lunar iDXA system (analysis version: 16). Technologist: ALW PATIENT: Name: Tracy, Sampson Patient ID: [id number] Birth Date: 1956/02/19 Height: 65.0 in. Sex: Female Measured: 07/19/2023 Weight: 307.0 lbs. Indications: Bilateral Ovariectomy, Estrogen Deficiency, Hypothyroid, Hysterectomy, Levothyroxine , Post Menopausal Fractures: Treatments: Calcium, Vitamin D  ASSESSMENT: The BMD measured at DualFemur Neck Left is 0.936 g/cm2 with a T-score of -0.7. This patient is considered normal according to World Health Organization [facility]) criteria.  Scan quality is limited due to body habitus. Exclusions: L-1 was excluded due to degenerative changes. Site Region Measured Date Measured Age YA BMD Significant CHANGE T-score DualFemur Neck Left  6/19/2023    68.5         -0.6    0.936 g/cm2 AP Spine  L2-L4      07/19/2023    68.5  1.7     1.431 g/cm2 DualFemur Total Mean 07/19/2023    68.5         -0.5    0.948 g/cm2 World Health Organization [facility]) criteria for post-menopausal, Caucasian Women: Normal       T-score at or above -1 SD Osteopenia   T-score between -1 and -2.5 SD Osteoporosis T-score at or below -2.5 SD RECOMMENDATION: 1. All patients should optimize calcium and vitamin D  intake. 2. Consider FDA approved medical therapies in postmenopausal women and men aged 27 years and older, based on the following: a. A hip or vertebral (clinical or morphometric) fracture b. T-score = -2.5 at the femoral neck or spine after appropriate evaluation to exclude secondary causes c. Low bone mass (T-score between -1.0 and -2.5 at the femoral neck or spine) and a 10- year probability of a hip fracture = 3% or a 10 year probability of a major osteoporosis-related fracture = 20% based on the [place] -adapted WHO algorithm. 3. Clinician judgement and/or patient preference may indicate treatment for people with10-year fracture probabilities above or below these levels. FOLLOW-UP: Patients with diagnosis of osteoporosis or at high risk for fracture should have regular bone mineral density tests. For patients eligible for Medicare routine testing is allowed once every 2 years. The testing frequency can be increased to one year for patients who have rapidly progressing disease, those who are receiving or discontinuing medical therapy to restore bone mass, or have additional risk factors. I have reviewed this study and agree with the findings. [facility] Electronically Signed   By: Dina  Arceo M.D.   On: 07/19/2023 08:55    Assessment & Plan:   Problem List  Items Addressed This Visit     Hypothyroidism   Chronic Cont on Levoxyl       Vitamin D  deficiency   On Vit D      OBESITY, MORBID   Wt Readings from Last 3 Encounters:  11/15/23 (!) 303 lb (137.4 kg)  10/05/23 (!) 304 lb 6.4 oz (138.1 kg)  08/24/23 (!) 306 lb (138.8 kg)         Primary hypertension   Cont on Amlodipine , Losartan   She declined diuretics. Severe obesity Pt declined ref to [facility]      OSA on CPAP   On autoCPAP -avg pr 13 cm      Syncope - Primary   No relapse Will continue to watch Hydrate well Avoid stress      Stress at work   Last day at work was today         No orders of the defined types were placed in this encounter.     Follow-up: Return in about 3 months (around 02/15/2024) for a follow-up visit.  Marolyn Noel, MD

## 2023-11-15 NOTE — Assessment & Plan Note (Signed)
 On autoCPAP -avg pr 13 cm

## 2023-11-15 NOTE — Assessment & Plan Note (Signed)
 Cont on Amlodipine, Losartan  She declined diuretics. Severe obesity Pt declined ref to [facility]

## 2023-11-15 NOTE — Telephone Encounter (Signed)
 Copied from CRM [id number]. Topic: Clinical - Medication Refill >> Nov 15, 2023  3:17 PM Drema MATSU wrote: Medication:  amLODipine  (NORVASC ) 5 MG tablet   Has the patient contacted their pharmacy? Yes (Agent: If no, request that the patient contact the pharmacy for the refill. If patient does not wish to contact the pharmacy document the reason why and proceed with request.) (Agent: If yes, when and what did the pharmacy advise?) 0 refills   This is the patient's preferred pharmacy:  [id number] - [place], [place] - [address] [place] [address] Phone: [phone] Fax: [phone]  Is this the correct pharmacy for this prescription? Yes If no, delete pharmacy and type the correct one.   Has the prescription been filled recently? No 90 days   Is the patient out of the medication? No  Has the patient been seen for an appointment in the last year OR does the patient have an upcoming appointment? Yes  Can we respond through MyChart? No  Agent: Please be advised that Rx refills may take up to 3 business days. We ask that you follow-up with your pharmacy.

## 2023-11-15 NOTE — Assessment & Plan Note (Signed)
 Chronic Cont on Levoxyl

## 2023-11-15 NOTE — Assessment & Plan Note (Signed)
 Last day at work was today

## 2023-11-17 ENCOUNTER — Ambulatory Visit: Admitting: Internal Medicine

## 2023-11-25 NOTE — Telephone Encounter (Unsigned)
 Copied from CRM [id number]. Topic: General - Other >> Nov 24, 2023  4:24 PM Aisha D wrote: Reason for CRM: Pt is requesting to speak with Hadassah in regards to the paper that needs to be faxed to [facility]. Pt would like a callback regarding this concern.

## 2023-11-26 ENCOUNTER — Telehealth: Payer: Self-pay | Admitting: Internal Medicine

## 2023-11-26 ENCOUNTER — Telehealth: Payer: Self-pay

## 2023-11-26 NOTE — Telephone Encounter (Signed)
 Copied from CRM [id number]. Topic: General - Call Back - No Documentation >> Nov 26, 2023 12:11 PM Tracy Sampson wrote: Reason for CRM: Patient states she had a missed call. There is no documentation in the patients chart. Patient can be reached at [phone].

## 2023-11-26 NOTE — Telephone Encounter (Signed)
 Copied from CRM [id number]. Topic: General - Other >> Nov 24, 2023  4:24 PM Aisha D wrote: Reason for CRM: Pt is requesting to speak with Hadassah in regards to the paper that needs to be faxed to [facility]. Pt would like a callback regarding this concern. >> Nov 26, 2023 12:09 PM Geroldine GRADE wrote: Patient calling in to see if she will received a call today

## 2023-11-29 ENCOUNTER — Ambulatory Visit: Payer: Self-pay

## 2023-11-29 NOTE — Telephone Encounter (Signed)
 FYI Only or Action Required?: Action required by provider: request for appointment.  Patient was last seen in primary care on 11/15/2023 by Plotnikov, Karlynn GAILS, MD.  Called Nurse Triage reporting Loss of Consciousness.  Symptoms began a week ago.  Interventions attempted: Rest, hydration, or home remedies.  Symptoms are: stable.  Triage Disposition: See PCP Within 2 Weeks  Patient/caregiver understands and will follow disposition?: YesCopied from CRM [id number]. Topic: Clinical - Red Word Triage >> Nov 29, 2023 10:09 AM Jayma L wrote: Red Word that prompted transfer to Nurse Triage: fainted last Monday , was out a min or two, was walking and fell on floor, happened in living room. Reason for Disposition  Simple fainting is a chronic symptom (has occurred multiple times)  Answer Assessment - Initial Assessment Questions 1. ONSET: How long were you unconscious? (e.g., minutes, seconds) When did it happen?     Couple of seconds 2. CONTENT: What happened during the period of unconsciousness? (e.g., seizure activity)      Pt remembers hitting floor and then getting up 3. MENTAL STATUS: Alert and oriented now? (e.g., oriented x 3 = name, month, location)      A &O x3 4. TRIGGER: What do you think caused the fainting? What were you doing just before you fainted?  (e.g., exercise, sudden standing up, prolonged standing)     No sure  5. RECURRENT SYMPTOM: Have you ever passed out before? If Yes, ask: When was the last time? and What happened that time?      Yes-several months ago 6. INJURY: Did you hurt yourself when you fell?      no 7. CARDIAC SYMPTOMS: Have you had any of the following symptoms: chest pain, difficulty breathing, palpitations?     denies 8. NEUROLOGIC SYMPTOMS: Have you had any of the following symptoms: headache, numbness, vertigo, weakness?     denies 9. GI SYMPTOMS: Have you had any of the following symptoms: abdomen pain, vomiting, diarrhea,  blood in stools?     denies 10. OTHER SYMPTOMS: Do you have any other symptoms?       denies   Pt was able to get herself up without injury. Pt was sitting and got up to walk to kitchen and hit the floor. No injuries. Pt feels great now and since. Pt requested to only see PCP. Soonest appt is 7/15.  Pt is asking about insurance papers due to being without work. Pt has been dealings with Hadassah and now can't get ahold of her. CAL was called and maria was notified.  Protocols used: Alberta Cairns's

## 2023-11-29 NOTE — Telephone Encounter (Signed)
 Paperwork was sent in and pt had a copy as well.

## 2023-11-29 NOTE — Telephone Encounter (Signed)
 Reason for CRM: patient called in asking for a callback stated she has been waiting 6 weeks for a update on her disability information, said Hadassah should of got ahold of her by now and she spoke to [facility] on Friday and she just needs the paperwork filled out as soon as possible. Looking for a callback [phone]

## 2023-11-30 ENCOUNTER — Ambulatory Visit (INDEPENDENT_AMBULATORY_CARE_PROVIDER_SITE_OTHER): Admitting: Internal Medicine

## 2023-11-30 ENCOUNTER — Encounter: Payer: Self-pay | Admitting: Internal Medicine

## 2023-11-30 VITALS — BP 132/80 | HR 92 | Temp 92.0°F | Ht 65.0 in | Wt 304.0 lb

## 2023-11-30 DIAGNOSIS — Z6841 Body Mass Index (BMI) 40.0 and over, adult: Secondary | ICD-10-CM

## 2023-11-30 DIAGNOSIS — R55 Syncope and collapse: Secondary | ICD-10-CM

## 2023-11-30 DIAGNOSIS — G4733 Obstructive sleep apnea (adult) (pediatric): Secondary | ICD-10-CM | POA: Diagnosis not present

## 2023-11-30 DIAGNOSIS — F3341 Major depressive disorder, recurrent, in partial remission: Secondary | ICD-10-CM

## 2023-11-30 LAB — CBC WITH DIFFERENTIAL/PLATELET
Basophils Absolute: 0 K/uL (ref 0.0–0.1)
Basophils Relative: 0.6 % (ref 0.0–3.0)
Eosinophils Absolute: 0.3 K/uL (ref 0.0–0.7)
Eosinophils Relative: 4.4 % (ref 0.0–5.0)
HCT: 39.9 % (ref 36.0–46.0)
Hemoglobin: 12.6 g/dL (ref 12.0–15.0)
Lymphocytes Relative: 30.3 % (ref 12.0–46.0)
Lymphs Abs: 1.8 K/uL (ref 0.7–4.0)
MCHC: 31.7 g/dL (ref 30.0–36.0)
MCV: 93.6 fl (ref 78.0–100.0)
Monocytes Absolute: 0.4 K/uL (ref 0.1–1.0)
Monocytes Relative: 6.6 % (ref 3.0–12.0)
Neutro Abs: 3.5 K/uL (ref 1.4–7.7)
Neutrophils Relative %: 58.1 % (ref 43.0–77.0)
Platelets: 233 K/uL (ref 150.0–400.0)
RBC: 4.26 Mil/uL (ref 3.87–5.11)
RDW: 13.8 % (ref 11.5–15.5)
WBC: 6 K/uL (ref 4.0–10.5)

## 2023-11-30 LAB — COMPREHENSIVE METABOLIC PANEL WITH GFR
ALT: 19 U/L (ref 0–35)
AST: 21 U/L (ref 0–37)
Albumin: 4.2 g/dL (ref 3.5–5.2)
Alkaline Phosphatase: 78 U/L (ref 39–117)
BUN: 14 mg/dL (ref 6–23)
CO2: 30 meq/L (ref 19–32)
Calcium: 9.9 mg/dL (ref 8.4–10.5)
Chloride: 107 meq/L (ref 96–112)
Creatinine, Ser: 0.89 mg/dL (ref 0.40–1.20)
GFR: 66.39 mL/min (ref >60.00)
Glucose, Bld: 116 mg/dL — ABNORMAL HIGH (ref 70–99)
Potassium: 4.4 meq/L (ref 3.5–5.1)
Sodium: 144 meq/L (ref 135–145)
Total Bilirubin: 0.5 mg/dL (ref 0.2–1.2)
Total Protein: 7.5 g/dL (ref 6.0–8.3)

## 2023-11-30 LAB — URINALYSIS
Bilirubin Urine: NEGATIVE
Hgb urine dipstick: NEGATIVE
Ketones, ur: NEGATIVE
Leukocytes,Ua: NEGATIVE
Nitrite: NEGATIVE
Specific Gravity, Urine: >=1.03 — AB (ref 1.000–1.030)
Total Protein, Urine: NEGATIVE
Urine Glucose: NEGATIVE
Urobilinogen, UA: 0.2 (ref 0.0–1.0)
pH: 6 (ref 5.0–8.0)

## 2023-11-30 LAB — CORTISOL: Cortisol, Plasma: 11.4 ug/dL

## 2023-11-30 LAB — TSH: TSH: 0.53 u[IU]/mL (ref 0.35–5.50)

## 2023-11-30 LAB — MAGNESIUM: Magnesium: 1.9 mg/dL (ref 1.5–2.5)

## 2023-11-30 NOTE — Assessment & Plan Note (Signed)
 No change On Zoloft 

## 2023-11-30 NOTE — Telephone Encounter (Signed)
 Pt was seen today in office with PCP and I was able to obtain the proper paperwork from pt that has to be filled out. Pt FMLA was denied in May due to no changes in pts health or with her care... Her job denied to extend her FMLA and request pt start back working. Pt was not cleared to go back but her job said she is cause they feel she is not due to provider's advice.

## 2023-11-30 NOTE — Assessment & Plan Note (Signed)
Using CPAP every night . 

## 2023-11-30 NOTE — Assessment & Plan Note (Addendum)
 Another syncopal spell on Monday last week. She passed out at home. She got up and hit the floor. No orthostatic sx's. Pt lives alone... Will ref to see Dr Elmira - loop recorder was considered for [facility] before Workup thus far with echocardiogram and stress test was unremarkable.  Monitor showed 1 episode of 8 beat VT without any symptoms. Given her recurrent syncope, consider repeat evaluation for loop recorder placement. No driving x6 mo was discussed Call 911 if relapsed

## 2023-11-30 NOTE — Assessment & Plan Note (Signed)
 Wt Readings from Last 3 Encounters:  11/30/23 (!) 304 lb (137.9 kg)  11/15/23 (!) 303 lb (137.4 kg)  10/05/23 (!) 304 lb 6.4 oz (138.1 kg)

## 2023-11-30 NOTE — Progress Notes (Signed)
 Subjective:  Patient ID: Tracy Sampson, female    DOB: 1955/09/09  Age: 68 y.o. MRN: [id number]  CC: Loss of Consciousness (Pt states she had a syncope episode x1 week ago and is here to f/u on the episode.)   HPI Tracy Sampson presents for a syncopal spell on Monday last week. She passed out at home. She got up and hit the floor. No orthostatic sx's. Pt lives alone... Mentioning vague CP occasionally F/u on HTN, depression, obesity   Outpatient Medications Prior to Visit  Medication Sig Dispense Refill   amLODipine  (NORVASC ) 5 MG tablet 1 po qd 90 tablet 3   cholecalciferol (VITAMIN D3) 25 MCG (1000 UNIT) tablet Take 1,000 Units by mouth daily.     cyclobenzaprine  (FLEXERIL ) 5 MG tablet Take 1 tablet (5 mg total) by mouth 3 (three) times daily as needed for muscle spasms. 30 tablet 0   diphenoxylate -atropine  (LOMOTIL ) 2.5-0.025 MG tablet Take 1-2 tablets by mouth 4 (four) times daily as needed for diarrhea or loose stools. 60 tablet 1   dorzolamide-timolol (COSOPT) 22.3-6.8 MG/ML ophthalmic solution Place 1 drop into both eyes 2 (two) times daily.      doxycycline  (VIBRA -TABS) 100 MG tablet Take 1 tablet (100 mg total) by mouth 2 (two) times daily. 14 tablet 3   famotidine  (PEPCID ) 40 MG tablet Take 1 tablet (40 mg total) by mouth daily. 90 tablet 2   latanoprost (XALATAN) 0.005 % ophthalmic solution Apply to eye.     levothyroxine  (SYNTHROID ) 150 MCG tablet TAKE 1 TABLET BY MOUTH DAILY BEFORE BREAKFAST. 90 tablet 3   meloxicam  (MOBIC ) 7.5 MG tablet TAKE 1 TABLET BY MOUTH EVERY DAY 30 tablet 0   potassium chloride  (KLOR-CON ) 10 MEQ tablet TAKE 1 TABLET BY MOUTH EVERY DAY 90 tablet 3   sertraline  (ZOLOFT ) 100 MG tablet TAKE 1 AND 1/2 TABLETS DAILY BY MOUTH 135 tablet 3   tolterodine  (DETROL  LA) 4 MG 24 hr capsule Take 1 capsule (4 mg total) by mouth daily. 60 capsule 11   vitamin B-12 (CYANOCOBALAMIN ) 500 MCG tablet Take 500 mcg by mouth daily.     No facility-administered  medications prior to visit.    ROS: Review of Systems  Constitutional:  Negative for activity change, appetite change, chills, fatigue and unexpected weight change.  HENT:  Negative for congestion, mouth sores and sinus pressure.   Eyes:  Negative for visual disturbance.  Respiratory:  Negative for cough, chest tightness and wheezing.   Cardiovascular:  Negative for palpitations.  Gastrointestinal:  Negative for abdominal pain and nausea.  Genitourinary:  Negative for difficulty urinating, frequency and vaginal pain.  Musculoskeletal:  Negative for back pain and gait problem.  Skin:  Negative for pallor and rash.  Neurological:  Positive for dizziness. Negative for tremors, weakness, numbness and headaches.  Psychiatric/Behavioral:  Negative for confusion and sleep disturbance. The patient is nervous/anxious.     Objective:  BP 132/80   Pulse 92   Temp (!) 92 F (33.3 C) (Oral)   Ht 5' 5 (1.651 m)   Wt (!) 304 lb (137.9 kg)   LMP  (LMP Unknown)   SpO2 97%   BMI 50.59 kg/m   BP Readings from Last 3 Encounters:  11/30/23 132/80  11/15/23 120/82  10/05/23 130/85    Wt Readings from Last 3 Encounters:  11/30/23 (!) 304 lb (137.9 kg)  11/15/23 (!) 303 lb (137.4 kg)  10/05/23 (!) 304 lb 6.4 oz (138.1 kg)    Physical  Exam Constitutional:      General: She is not in acute distress.    Appearance: She is well-developed. She is obese.  HENT:     Head: Normocephalic.     Right Ear: External ear normal.     Left Ear: External ear normal.     Nose: Nose normal.  Eyes:     General:        Right eye: No discharge.        Left eye: No discharge.     Conjunctiva/sclera: Conjunctivae normal.     Pupils: Pupils are equal, round, and reactive to light.  Neck:     Thyroid : No thyromegaly.     Vascular: No JVD.     Trachea: No tracheal deviation.  Cardiovascular:     Rate and Rhythm: Normal rate and regular rhythm.     Heart sounds: Normal heart sounds.  Pulmonary:      Effort: No respiratory distress.     Breath sounds: No stridor. No wheezing.  Abdominal:     General: Bowel sounds are normal. There is no distension.     Palpations: Abdomen is soft. There is no mass.     Tenderness: There is no abdominal tenderness. There is no guarding or rebound.  Musculoskeletal:        General: No tenderness.     Cervical back: Normal range of motion and neck supple. No rigidity.     Right lower leg: No edema.     Left lower leg: No edema.  Lymphadenopathy:     Cervical: No cervical adenopathy.  Skin:    Findings: No erythema or rash.  Neurological:     Cranial Nerves: No cranial nerve deficit.     Motor: No abnormal muscle tone.     Coordination: Coordination normal.     Deep Tendon Reflexes: Reflexes normal.  Psychiatric:        Behavior: Behavior normal.        Thought Content: Thought content normal.        Judgment: Judgment normal.     Lab Results  Component Value Date   WBC 7.3 02/10/2023   HGB 12.9 02/10/2023   HCT 41.0 02/10/2023   PLT 252.0 02/10/2023   GLUCOSE 115 (H) 02/10/2023   CHOL 221 (H) 11/06/2021   TRIG 161.0 (H) 11/06/2021   HDL 45.20 11/06/2021   LDLDIRECT 149.3 10/23/2010   LDLCALC 144 (H) 11/06/2021   ALT 20 02/10/2023   AST 23 02/10/2023   NA 146 (H) 02/10/2023   K 4.0 02/10/2023   CL 109 02/10/2023   CREATININE 0.82 02/10/2023   BUN 16 02/10/2023   CO2 28 02/10/2023   TSH 0.45 02/10/2023   INR 1.1 (H) 09/05/2019   HGBA1C 5.7 09/23/2022    DG BONE DENSITY (DXA) Result Date: 07/19/2023 EXAM: DUAL X-RAY ABSORPTIOMETRY (DXA) FOR BONE MINERAL DENSITY IMPRESSION: Referring Physician:  ALMARIE MARLA CARPEN Your patient completed a bone mineral density test using GE Lunar iDXA system (analysis version: 16). Technologist: ALW PATIENT: Name: Tracy, Sampson Patient ID: [id number] Birth Date: 09/16/1955 Height: 65.0 in. Sex: Female Measured: 07/19/2023 Weight: 307.0 lbs. Indications: Bilateral Ovariectomy, Estrogen Deficiency,  Hypothyroid, Hysterectomy, Levothyroxine , Post Menopausal Fractures: Treatments: Calcium, Vitamin D  ASSESSMENT: The BMD measured at DualFemur Neck Left is 0.936 g/cm2 with a T-score of -0.7. This patient is considered normal according to World Health Organization [facility]) criteria. Scan quality is limited due to body habitus. Exclusions: L-1 was excluded due to degenerative changes. Site  Region Measured Date Measured Age YA BMD Significant CHANGE T-score DualFemur Neck Left  07/19/2023    68.5         -0.7    0.936 g/cm2 AP Spine  L2-L4      07/19/2023    68.5         1.7     1.431 g/cm2 DualFemur Total Mean 6/19/2023    68.5         -0.5    0.948 g/cm2 World Health Organization [facility]) criteria for post-menopausal, Caucasian Women: Normal       T-score at or above -1 SD Osteopenia   T-score between -1 and -2.5 SD Osteoporosis T-score at or below -2.5 SD RECOMMENDATION: 1. All patients should optimize calcium and vitamin D  intake. 2. Consider FDA approved medical therapies in postmenopausal women and men aged 70 years and older, based on the following: a. A hip or vertebral (clinical or morphometric) fracture b. T-score = -2.5 at the femoral neck or spine after appropriate evaluation to exclude secondary causes c. Low bone mass (T-score between -1.0 and -2.5 at the femoral neck or spine) and a 10- year probability of a hip fracture = 3% or a 10 year probability of a major osteoporosis-related fracture = 20% based on the [place] -adapted WHO algorithm. 3. Clinician judgement and/or patient preference may indicate treatment for people with10-year fracture probabilities above or below these levels. FOLLOW-UP: Patients with diagnosis of osteoporosis or at high risk for fracture should have regular bone mineral density tests. For patients eligible for Medicare routine testing is allowed once every 2 years. The testing frequency can be increased to one year for patients who have rapidly progressing disease, those who are receiving  or discontinuing medical therapy to restore bone mass, or have additional risk factors. I have reviewed this study and agree with the findings. [facility] Electronically Signed   By: Dina  Arceo M.D.   On: 07/19/2023 08:55    Assessment & Plan:   Problem List Items Addressed This Visit     OBESITY, MORBID   Wt Readings from Last 3 Encounters:  11/30/23 (!) 304 lb (137.9 kg)  11/15/23 (!) 303 lb (137.4 kg)  10/05/23 (!) 304 lb 6.4 oz (138.1 kg)         Relevant Orders   TSH   Depression   No change On Zoloft       Relevant Orders   TSH   OSA on CPAP   Using CPAP every night      Relevant Orders   TSH   Syncope - Primary   Another syncopal spell on Monday last week. She passed out at home. She got up and hit the floor. No orthostatic sx's. Pt lives alone... Will ref to see Dr Elmira - loop recorder was considered for [facility] before Workup thus far with echocardiogram and stress test was unremarkable.  Monitor showed 1 episode of 8 beat VT without any symptoms. Given her recurrent syncope, consider repeat evaluation for loop recorder placement. No driving x6 mo was discussed Call 911 if relapsed      Relevant Orders   Ambulatory referral to Cardiology   CBC with Differential/Platelet   Comprehensive metabolic panel with GFR   Cortisol   TSH   Urinalysis   Magnesium      No orders of the defined types were placed in this encounter.     Follow-up: Return in about 6 weeks (around 01/11/2024) for a follow-up visit.  Alex Brandolyn Shortridge,  MD

## 2023-12-02 NOTE — Telephone Encounter (Unsigned)
 Copied from CRM [id number]. Topic: Medical Record Request - Other >> Dec 02, 2023 12:23 PM Arm[place] J wrote: Reason for CRM: Patient would like to talk to Dr. Alexandra nurse regarding her FMLA paperwork. Her employer is denying her pay from the past 8 weeks due to no changes. She is stating that her employer did not receive the paperwork. ----------------------------------------------------------------------- From previous Reason for Contact - Medical Advice: Reason for CRM:

## 2023-12-02 NOTE — Telephone Encounter (Signed)
 Tried to contact the pt about FMLA paperwork that was given at her visit to PCP... The paperwork is NOT for us  to fill out. It is an appeals request that the pt is needing to request, fill and sign with her reasoning for why she is not agreeing with her job decision.

## 2023-12-03 ENCOUNTER — Encounter: Payer: Self-pay | Admitting: Internal Medicine

## 2023-12-03 NOTE — Telephone Encounter (Signed)
 I was able to speak with the pt and was able to inform her that we can not fill the form that was given to us  as we can not appeal anything on her behalf with her job due to the request is coming from the pts stand point not her providers.  I was able to inform pt we can write a letter for her to take into her job. Pt has agreed to the letter.  I was able to type the letter and leave it on PCP desk to review and if he is in agreement to said letter he will sign it and I can fax it to the pts employer as requested by the pt upon pcp return next week.  Average time frame for paperwork is 7-10 business days.

## 2023-12-06 ENCOUNTER — Ambulatory Visit: Payer: Self-pay | Admitting: Internal Medicine

## 2023-12-07 NOTE — Telephone Encounter (Unsigned)
 Copied from CRM [id number]. Topic: General - Other >> Dec 07, 2023 11:26 AM Lavanda D wrote: Reason for CRM: Patient is calling to check the status of the fax from her last visits for [employer], she needs these sent to her since she is disputing it. She would like Hadassah to give her a call back. She said that [employer] needs more information regarding her treatment, she said that the previous paperwork from Dr. Garald did not have enough specifics regarding the stress/arthritis, medications she is taking etc, and what is preventing her from working. Fax #: [phone], she said if her fax doesn't work please mail to her home address.

## 2023-12-08 ENCOUNTER — Telehealth: Payer: Self-pay

## 2023-12-08 NOTE — Telephone Encounter (Signed)
 Patient called in frustrated with here [employer] STD adjuster as they are still needing information to process her last 8 weeks of pay. Patient explained she spoke with them yesterday and they provided details of what additional information was needed.  In review of the paperwork she provided last week 7/15 during her appointment and the previous appointment in June, it was not for us  to complete but for her to complete the appeal and send back to them. Explained to patient she needs to complete this paperwork and sign and return to them, not the dr. Lawerance. During last visit Dr. Garald provided patient with a detail letter and copy of the office note for her records, as she does not have MyChart.  Patient expressed understanding and asked if I could fax and mail a copy of the paperwork I have to her. Faxed (to home phone also a fax machine, confirmation of successful transmission) and mailing copies to patient home address.  No additional questions at this time.

## 2023-12-08 NOTE — Telephone Encounter (Signed)
 Pt has spoken with Suzen and was informed she will be taken care of it.

## 2023-12-27 ENCOUNTER — Encounter: Payer: Self-pay | Admitting: Cardiology

## 2023-12-27 ENCOUNTER — Ambulatory Visit: Attending: Cardiology | Admitting: Cardiology

## 2023-12-27 VITALS — BP 136/82 | HR 70 | Ht 66.0 in | Wt 307.0 lb

## 2023-12-27 DIAGNOSIS — I10 Essential (primary) hypertension: Secondary | ICD-10-CM | POA: Diagnosis not present

## 2023-12-27 DIAGNOSIS — R55 Syncope and collapse: Secondary | ICD-10-CM | POA: Diagnosis not present

## 2023-12-27 DIAGNOSIS — E782 Mixed hyperlipidemia: Secondary | ICD-10-CM | POA: Insufficient documentation

## 2023-12-27 NOTE — Progress Notes (Signed)
 Cardiology Office Note:  .   Date:  12/27/2023  ID:  Tracy Sampson, DOB 02/25/1956, MRN [id number] PCP: Garald Karlynn GAILS, MD  [facility] Providers Cardiologist:  Newman Lawrence, MD PCP: Garald Karlynn GAILS, MD  Chief Complaint  Patient presents with   Hypertension   Syncope       History of Present Illness: .    Tracy Sampson is a 68 y.o. female with hypertension, hyperlipidemia, hypothyroidism, syncope  Patient has continued to have syncopal episodes without any warning signs.  Most recent episode occurred in July 2025.  Patient attributes most recent episode to stress, but feels better now that she is retired and looking forward to stress-free life.  Workup with stress test showed no ischemia, echocardiogram did not show significant abnormalities.  External monitor showed 1 episode of VT, which was not correlated with symptoms.  Separately, she had multiple episodes of symptoms without any syncope, that did not correlate with any arrhythmia.  Vitals:   12/27/23 0806  BP: 136/82  Pulse: 70  SpO2: 96%      ROS:  Review of Systems  Cardiovascular:  Positive for syncope.     Studies Reviewed: SABRA        EKG 06/30/2023: Normal sinus rhythm Possible Left atrial enlargement When compared with ECG of 12-Apr-2023 15:13, No significant change was found   Mobile cardiac outpatient telemetry 15 days 04/2023: Dominant rhythm: Sinus. HR 50-126 bpm. Avg HR 69 bpm. 1 episode of VT, up to 116 bpm for 8 beats. (06/05/2023, 4:30 PM), with no reported symptoms. No other arrhythmia noted. 32 events transmitted, 10 patient triggered, 22 auto triggered.  No arrhythmia noted to associated with these episodes.   Pharmacological stress test 04/2023: No ischemia or infarction. Stress EF 72%. Low risk study.  Independently interpreted Echocardiogram 02/2023: Moderate LVH.  EF 60 to 65%.  Mildly abnormal global longitudinal strain.  Grade 1 diastolic  dysfunction. Calcified aortic valve, possibly bicuspid.  No significant AS/AI.   Physical Exam:   Physical Exam Vitals and nursing note reviewed.  Constitutional:      General: She is not in acute distress.    Appearance: She is obese.  Neck:     Vascular: No JVD.  Cardiovascular:     Rate and Rhythm: Normal rate and regular rhythm.     Heart sounds: Normal heart sounds. No murmur heard. Pulmonary:     Effort: Pulmonary effort is normal.     Breath sounds: Normal breath sounds. No wheezing or rales.  Musculoskeletal:     Right lower leg: No edema.     Left lower leg: No edema.      VISIT DIAGNOSES:   ICD-10-CM   1. Mixed hyperlipidemia  E78.2 Lipid panel    2. Syncope and collapse  R55     3. Primary hypertension  I10         ASSESSMENT AND PLAN: .    Tracy Sampson is a 68 y.o. female with hypertension, hyperlipidemia, hypothyroidism, syncope  Syncope: Recurrent episodes of syncope without warning sign, without clear etiology. Workup thus far with echocardiogram and stress test was unremarkable.  Monitor showed 1 episode of 8 beat VT without any symptoms. Given her recurrent syncope, I previously recommended loop recorder placement, but patient is not keen on it.  Consider repeat evaluation for loop recorder placement. I remain unsure if this would show any arrhythmic etiology, but could at least rule out a clear arrhythmogenic etiology. At this  time, patient wants to monitor for next few months, now that she is retired.  If she has any further recurrence, I would strongly recommend loop recorder placement.  Hypertension: Well-controlled.  Mixed hyperlipidemia: Check lipid panel.  Will consider statin therapy.     F/u in 6 months  Signed, Newman JINNY Lawrence, MD

## 2023-12-27 NOTE — Patient Instructions (Signed)
 Lab Work: Lipid panel  If you have labs (blood work) drawn today and your tests are completely normal, you will receive your results only by: MyChart Message (if you have MyChart) OR A paper copy in the mail If you have any lab test that is abnormal or we need to change your treatment, we will call you to review the results.   Follow-Up: At [facility], you and your health needs are our priority.  As part of our continuing mission to provide you with exceptional heart care, our providers are all part of one team.  This team includes your primary Cardiologist (physician) and Advanced Practice Providers or APPs (Physician Assistants and Nurse Practitioners) who all work together to provide you with the care you need, when you need it.  Your next appointment:   6 month(s)  Provider:   Cody Das, MD

## 2023-12-28 LAB — LIPID PANEL
Chol/HDL Ratio: 4.4 ratio (ref 0.0–4.4)
Cholesterol, Total: 223 mg/dL — ABNORMAL HIGH (ref 100–199)
HDL: 51 mg/dL (ref >39)
LDL Chol Calc (NIH): 151 mg/dL — ABNORMAL HIGH (ref 0–99)
Triglycerides: 119 mg/dL (ref 0–149)
VLDL Cholesterol Cal: 21 mg/dL (ref 5–40)

## 2023-12-29 ENCOUNTER — Ambulatory Visit: Payer: Self-pay | Admitting: Cardiology

## 2023-12-29 DIAGNOSIS — E782 Mixed hyperlipidemia: Secondary | ICD-10-CM

## 2023-12-29 MED ORDER — ROSUVASTATIN CALCIUM 20 MG PO TABS: 20.0000 mg | ORAL_TABLET | Freq: Every day | ORAL | 3 refills | Status: DC

## 2023-12-29 NOTE — Progress Notes (Signed)
 Cholesterol is uncontrolled. In addition to heart healthy diet and lifestyle, recommend Crestor  20 mg daily Repeat lipid panel in 3 months.  Thanks MJP

## 2024-01-03 NOTE — Progress Notes (Signed)
 MyChart message containing providers result note and interpretation read by patient on: Last read by Tracy Sampson at 12:40PM on 12/31/2023.

## 2024-01-07 ENCOUNTER — Other Ambulatory Visit: Payer: Self-pay | Admitting: Cardiology

## 2024-01-07 DIAGNOSIS — E782 Mixed hyperlipidemia: Secondary | ICD-10-CM

## 2024-01-24 ENCOUNTER — Telehealth: Payer: Self-pay | Admitting: Physician Assistant

## 2024-01-24 NOTE — Telephone Encounter (Signed)
 Received call from patient regarding the [employer] fax from 01/11/24. She stated this should have not been sent to our office as she only saw Ronal Dragon once and received a knee injection. Dr. Garald is her PCP and is the one supporting her disability completely unrelated to her knee. I advised patient that I would note this on the [employer] fax and fax back to them, which I have done. I emailed pt an authorization to complete to receive a copy of the [employer] fax per pts request.

## 2024-01-27 ENCOUNTER — Telehealth: Payer: Self-pay

## 2024-01-27 ENCOUNTER — Telehealth: Payer: Self-pay | Admitting: Radiology

## 2024-01-27 NOTE — Telephone Encounter (Signed)
 Copied from CRM #[id number]. Topic: General - Other >> Jan 26, 2024  4:38 PM Tracy Sampson wrote: Reason for CRM: Pt called to let provider know that [employer] needs more information regarding her disability. She faxed over paperwork that needs to be submitted by Monday, 9/15. Pls give pt a call.

## 2024-01-27 NOTE — Telephone Encounter (Signed)
 Copied from CRM #[id number]. Topic: General - Other >> Jan 26, 2024  4:38 PM Tracy Sampson wrote: Reason for CRM: Pt called to let provider know that [employer] needs more information regarding her disability. She faxed over paperwork that needs to be submitted by Monday, 9/15. Pls give pt a call. >> Jan 27, 2024 11:40 AM Tracy Sampson wrote: Patient is requesting this message gets to Tracy Sampson as that is who was helping in the past.  >> Jan 27, 2024 11:31 AM Tracy Sampson wrote: Patient is calling to follow up on request. She states that she need this submitted before Monday. Please call patient before the end of today. She stated that she is always in an uproar with [employer].

## 2024-01-28 ENCOUNTER — Telehealth: Payer: Self-pay

## 2024-01-28 ENCOUNTER — Telehealth: Payer: Self-pay | Admitting: Radiology

## 2024-01-28 NOTE — Telephone Encounter (Signed)
 Copied from CRM #[id number]. Topic: General - Other >> Jan 26, 2024  4:38 PM Dedra B wrote: Reason for CRM: Pt called to let provider know that [employer] needs more information regarding her disability. She faxed over paperwork that needs to be submitted by Monday, 9/15. Pls give pt a call. >> Jan 28, 2024  8:27 AM [name] wrote: Patient is calling to follow up on this request, she would like to speak with Suzen if possible regarding the [employer] forms that were suppose to be sent to the office for Dr.Plotnikov.  >> Jan 27, 2024 11:40 AM Chiquita SQUIBB wrote: Patient is requesting this message gets to [place] as that is who was helping in the past.  >> Jan 27, 2024 11:31 AM Drema MATSU wrote: Patient is calling to follow up on request. She states that she need this submitted before Monday. Please call patient before the end of today. She stated that she is always in an uproar with [employer].

## 2024-01-28 NOTE — Telephone Encounter (Signed)
 Called and spoke with patient in regards to her her fmla forms / disability forms and she stated that she has been speaking with Luke Burkitt the clinical rn in regards to this and also stated that she does not need a form filled out.

## 2024-01-28 NOTE — Telephone Encounter (Signed)
 Spoke with patient as her about [employer] form as she still has not gotten paid, as it was denied.  She stated that insurance has been calling her providers office asking for information.   During call patient stated that has gotten headache and almost past out due to the stress the process has caused. She stated [employer] told her they sent something to our office that was due on the 5th and they have not heard back. Informed that we have sent over OV, letters, and all required paperwork requested back in July.  Told patient I would look to see if there are any other documents in providers position, but there is nothing here outside of what we have already sent over. Patient made aware.

## 2024-02-02 ENCOUNTER — Telehealth: Payer: Self-pay | Admitting: Radiology

## 2024-02-02 NOTE — Telephone Encounter (Signed)
 Copied from CRM [id number]. Topic: Clinical - Medical Advice >> Feb 02, 2024  1:35 PM Anairis L wrote: Reason for CRM: Mrs. Tracy Sampson is asking for a call back from [facility] regarding FMLA paperwork.    Please return call today

## 2024-02-03 ENCOUNTER — Telehealth: Payer: Self-pay | Admitting: Radiology

## 2024-02-03 NOTE — Telephone Encounter (Signed)
 Copied from CRM [id number]. Topic: General - Other >> Feb 03, 2024 10:55 AM Jasmin G wrote: Reason for CRM: Pt called to try to speak to Ms. Luke, called CAL and was told that she will give pt a call back, please do ASAP at [phone].

## 2024-02-03 NOTE — Telephone Encounter (Signed)
 Patient called about her PCP, Dr. Garald. Is requesting that her PCP gives [employer] a list of all of Providers that has been treating her while on disability.  Please call back the patient to discuss with her.

## 2024-02-03 NOTE — Telephone Encounter (Signed)
Patient issues addressed in another encounter.

## 2024-02-04 ENCOUNTER — Telehealth: Payer: Self-pay | Admitting: Radiology

## 2024-02-04 NOTE — Telephone Encounter (Signed)
 Copied from CRM [id number]. Topic: General - Other >> Feb 03, 2024 10:55 AM Jasmin G wrote: Reason for CRM: Pt called to try to speak to Ms. Luke, called CAL and was told that she will give pt a call back, please do ASAP at [phone]. >> Feb 03, 2024  4:46 PM Winona R wrote: Pt following up on the return call status. CAL states Luke has been in meeting and will give her a call when she has a moment. Pt has been informed and Call dropped as pt was giving an detailed summery of what was going on.

## 2024-02-04 NOTE — Telephone Encounter (Signed)
 Spoke with patient this morning about details of the [employer] request. Told patient I will send a note stated that patient was referred to Cardiology in July.  Saw Dr. Elmira in August with [facility]. Copy of letter faxed to patient home fax and [employer] fax [phone]

## 2024-02-11 ENCOUNTER — Telehealth: Payer: Self-pay | Admitting: Radiology

## 2024-02-11 NOTE — Telephone Encounter (Signed)
 Copied from CRM [id number]. Topic: General - Other >> Feb 10, 2024 12:29 PM [name] wrote: Reason for CRM: Patient spoke with [employer] yesterday and the information they received was the incorrect information. She would like to speak with Dr.Plotnikov's medical assistant.

## 2024-02-15 ENCOUNTER — Telehealth: Payer: Self-pay | Admitting: Internal Medicine

## 2024-02-15 ENCOUNTER — Encounter: Payer: Self-pay | Admitting: Internal Medicine

## 2024-02-15 ENCOUNTER — Ambulatory Visit: Payer: Medicare (Managed Care) | Admitting: Internal Medicine

## 2024-02-15 VITALS — BP 130/82 | HR 63 | Temp 98.0°F | Ht 66.0 in | Wt 303.0 lb

## 2024-02-15 DIAGNOSIS — R55 Syncope and collapse: Secondary | ICD-10-CM | POA: Diagnosis not present

## 2024-02-15 DIAGNOSIS — F3341 Major depressive disorder, recurrent, in partial remission: Secondary | ICD-10-CM

## 2024-02-15 DIAGNOSIS — E782 Mixed hyperlipidemia: Secondary | ICD-10-CM

## 2024-02-15 DIAGNOSIS — L308 Other specified dermatitis: Secondary | ICD-10-CM

## 2024-02-15 DIAGNOSIS — H9193 Unspecified hearing loss, bilateral: Secondary | ICD-10-CM

## 2024-02-15 DIAGNOSIS — E034 Atrophy of thyroid (acquired): Secondary | ICD-10-CM | POA: Diagnosis not present

## 2024-02-15 DIAGNOSIS — E559 Vitamin D deficiency, unspecified: Secondary | ICD-10-CM | POA: Diagnosis not present

## 2024-02-15 DIAGNOSIS — Z566 Other physical and mental strain related to work: Secondary | ICD-10-CM

## 2024-02-15 NOTE — Telephone Encounter (Signed)
 Pt states [employer] is giving her a hard time and she was wondering if she could speak with Lenis) because she helped her last time.

## 2024-02-15 NOTE — Telephone Encounter (Signed)
 Patient is calling back. Requesting to speak to a Nurse Luke.

## 2024-02-15 NOTE — Progress Notes (Signed)
 30-day morning's  Subjective:  Patient ID: Tracy Sampson, female    DOB: 05/22/1955  Age: 68 y.o. MRN: [id number]  CC: Medical Management of Chronic Issues (3 Month follow up)   HPI Tracy Sampson presents for depression, syncope - no relapse, dyslipidemia, stress - better (not working). C/o hearing loss  Outpatient Medications Prior to Visit  Medication Sig Dispense Refill   amLODipine  (NORVASC ) 5 MG tablet 1 po qd 90 tablet 3   cholecalciferol (VITAMIN D3) 25 MCG (1000 UNIT) tablet Take 1,000 Units by mouth daily.     cyclobenzaprine  (FLEXERIL ) 5 MG tablet Take 1 tablet (5 mg total) by mouth 3 (three) times daily as needed for muscle spasms. 30 tablet 0   diphenoxylate -atropine  (LOMOTIL ) 2.5-0.025 MG tablet Take 1-2 tablets by mouth 4 (four) times daily as needed for diarrhea or loose stools. 60 tablet 1   dorzolamide-timolol (COSOPT) 22.3-6.8 MG/ML ophthalmic solution Place 1 drop into both eyes 2 (two) times daily.      famotidine  (PEPCID ) 40 MG tablet Take 1 tablet (40 mg total) by mouth daily. 90 tablet 2   latanoprost (XALATAN) 0.005 % ophthalmic solution Apply to eye.     levothyroxine  (SYNTHROID ) 150 MCG tablet TAKE 1 TABLET BY MOUTH DAILY BEFORE BREAKFAST. 90 tablet 3   meloxicam  (MOBIC ) 7.5 MG tablet TAKE 1 TABLET BY MOUTH EVERY DAY (Patient taking differently: Take 7.5 mg by mouth daily as needed.) 30 tablet 0   potassium chloride  (KLOR-CON ) 10 MEQ tablet TAKE 1 TABLET BY MOUTH EVERY DAY 90 tablet 3   rosuvastatin  (CRESTOR ) 20 MG tablet TAKE 1 TABLET BY MOUTH EVERY DAY 90 tablet 3   sertraline  (ZOLOFT ) 100 MG tablet TAKE 1 AND 1/2 TABLETS DAILY BY MOUTH 135 tablet 3   tolterodine  (DETROL  LA) 4 MG 24 hr capsule Take 1 capsule (4 mg total) by mouth daily. (Patient taking differently: Take 4 mg by mouth daily as needed.) 60 capsule 11   vitamin B-12 (CYANOCOBALAMIN ) 500 MCG tablet Take 500 mcg by mouth daily.     doxycycline  (VIBRA -TABS) 100 MG tablet Take 1 tablet (100  mg total) by mouth 2 (two) times daily. (Patient not taking: Reported on 02/15/2024) 14 tablet 3   No facility-administered medications prior to visit.    ROS: Review of Systems  Constitutional:  Negative for activity change, appetite change, chills, fatigue and unexpected weight change.  HENT:  Negative for congestion, mouth sores and sinus pressure.   Eyes:  Negative for visual disturbance.  Respiratory:  Negative for cough and chest tightness.   Gastrointestinal:  Negative for abdominal pain and nausea.  Genitourinary:  Negative for difficulty urinating, frequency and vaginal pain.  Musculoskeletal:  Negative for back pain and gait problem.  Skin:  Negative for pallor and rash.  Neurological:  Negative for dizziness, tremors, weakness, numbness and headaches.  Psychiatric/Behavioral:  Negative for confusion, dysphoric mood, sleep disturbance and suicidal ideas. The patient is nervous/anxious.     Objective:  BP 130/82   Pulse 63   Temp 98 F (36.7 C)   Ht 5' 6 (1.676 m)   Wt (!) 303 lb (137.4 kg)   LMP  (LMP Unknown)   SpO2 98%   BMI 48.91 kg/m   BP Readings from Last 3 Encounters:  02/15/24 130/82  12/27/23 136/82  11/30/23 132/80    Wt Readings from Last 3 Encounters:  02/15/24 (!) 303 lb (137.4 kg)  12/27/23 (!) 307 lb (139.3 kg)  11/30/23 (!) 304 lb (  137.9 kg)    Physical Exam Constitutional:      General: She is not in acute distress.    Appearance: She is well-developed. She is obese.  HENT:     Head: Normocephalic.     Right Ear: External ear normal.     Left Ear: External ear normal.     Nose: Nose normal.  Eyes:     General:        Right eye: No discharge.        Left eye: No discharge.     Conjunctiva/sclera: Conjunctivae normal.     Pupils: Pupils are equal, round, and reactive to light.  Neck:     Thyroid : No thyromegaly.     Vascular: No JVD.     Trachea: No tracheal deviation.  Cardiovascular:     Rate and Rhythm: Normal rate and regular  rhythm.     Heart sounds: Normal heart sounds.  Pulmonary:     Effort: No respiratory distress.     Breath sounds: No stridor. No wheezing.  Abdominal:     General: Bowel sounds are normal. There is no distension.     Palpations: Abdomen is soft. There is no mass.     Tenderness: There is no abdominal tenderness. There is no guarding or rebound.  Musculoskeletal:        General: No tenderness.     Cervical back: Normal range of motion and neck supple. No rigidity.  Lymphadenopathy:     Cervical: No cervical adenopathy.  Skin:    Findings: No erythema or rash.  Neurological:     Cranial Nerves: No cranial nerve deficit.     Motor: No abnormal muscle tone.     Coordination: Coordination normal.     Deep Tendon Reflexes: Reflexes normal.  Psychiatric:        Behavior: Behavior normal.        Thought Content: Thought content normal.        Judgment: Judgment normal.   Wax in the L ear  Lab Results  Component Value Date   WBC 6.0 11/30/2023   HGB 12.6 11/30/2023   HCT 39.9 11/30/2023   PLT 233.0 11/30/2023   GLUCOSE 116 (H) 11/30/2023   CHOL 223 (H) 12/27/2023   TRIG 119 12/27/2023   HDL 51 12/27/2023   LDLDIRECT 149.3 10/23/2010   LDLCALC 151 (H) 12/27/2023   ALT 19 11/30/2023   AST 21 11/30/2023   NA 144 11/30/2023   K 4.4 11/30/2023   CL 107 11/30/2023   CREATININE 0.89 11/30/2023   BUN 14 11/30/2023   CO2 30 11/30/2023   TSH 0.53 11/30/2023   INR 1.1 (H) 09/05/2019   HGBA1C 5.7 09/23/2022    DG BONE DENSITY (DXA) Result Date: 07/19/2023 EXAM: DUAL X-RAY ABSORPTIOMETRY (DXA) FOR BONE MINERAL DENSITY IMPRESSION: Referring Physician:  ALMARIE MARLA Sampson Your patient completed a bone mineral density test using GE Lunar iDXA system (analysis version: 16). Technologist: Tracy Sampson PATIENT: Name: Tracy, Sampson Patient ID: [id number] Birth Date: Jan 13, 1956 Height: 65.0 in. Sex: Female Measured: 07/19/2023 Weight: 307.0 lbs. Indications: Bilateral Ovariectomy, Estrogen  Deficiency, Hypothyroid, Hysterectomy, Levothyroxine , Post Menopausal Fractures: Treatments: Calcium , Vitamin D  ASSESSMENT: The BMD measured at DualFemur Neck Left is 0.936 g/cm2 with a T-score of -0.7. This patient is considered normal according to World Health Organization [facility]) criteria. Scan quality is limited due to body habitus. Exclusions: L-1 was excluded due to degenerative changes. Site Region Measured Date Measured Age YA BMD Significant CHANGE T-score  DualFemur Neck Left  07[age]19/2023    68.5         -0.6    0.936 g/cm2 AP Spine  L2-L4      07/19/2023    68.5         1.7     1.431 g/cm2 DualFemur Total Mean 07/19/2023    68.5         -0.5    0.948 g/cm2 World Health Organization [facility]) criteria for post-menopausal, Caucasian Women: Normal       T-score at or above -1 SD Osteopenia   T-score between -1 and -2.5 SD Osteoporosis T-score at or below -2.5 SD RECOMMENDATION: 1. All patients should optimize calcium  and vitamin D  intake. 2. Consider FDA approved medical therapies in postmenopausal women and men aged 62 years and older, based on the following: a. A hip or vertebral (clinical or morphometric) fracture b. T-score = -2.5 at the femoral neck or spine after appropriate evaluation to exclude secondary causes c. Low bone mass (T-score between -1.0 and -2.5 at the femoral neck or spine) and a 10- year probability of a hip fracture = 3% or a 10 year probability of a major osteoporosis-related fracture = 20% based on the [place] -adapted WHO algorithm. 3. Clinician judgement and/or patient preference may indicate treatment for people with10-year fracture probabilities above or below these levels. FOLLOW-UP: Patients with diagnosis of osteoporosis or at high risk for fracture should have regular bone mineral density tests. For patients eligible for Medicare routine testing is allowed once every 2 years. The testing frequency can be increased to one year for patients who have rapidly progressing disease, those who  are receiving or discontinuing medical therapy to restore bone mass, or have additional risk factors. I have reviewed this study and agree with the findings. [facility] Electronically Signed   By: Dina  Arceo M.D.   On: 07/19/2023 08:55    Assessment & Plan:   Problem List Items Addressed This Visit     Depression   No change On Zoloft       Eczema   A patch of eczema 5x7 cm on sacrum area       Hypothyroidism   Chronic Cont on Levoxyl       Mixed hyperlipidemia   On diet Dr Elmira suggested treatment -  on Crestor       RESOLVED: Stress at work   Not working now      Syncope and collapse - Primary   Per Dr Elmira: At this time, patient wants to monitor for next few months, now that she is retired. If she has any further recurrence, I would strongly recommend loop recorder placement.       Vitamin D  deficiency   On Vit D      Other Visit Diagnoses       Bilateral hearing loss, unspecified hearing loss type       Relevant Orders   Ambulatory referral to ENT         No orders of the defined types were placed in this encounter.     Follow-up: Return in about 3 months (around 05/16/2024) for a follow-up visit.  Marolyn Noel, MD

## 2024-02-15 NOTE — Assessment & Plan Note (Signed)
A patch of eczema 5x7 cm on sacrum area

## 2024-02-15 NOTE — Assessment & Plan Note (Signed)
 No change On Zoloft 

## 2024-02-15 NOTE — Assessment & Plan Note (Addendum)
 On diet Dr Elmira suggested treatment -  on Crestor 

## 2024-02-15 NOTE — Assessment & Plan Note (Signed)
 Chronic Cont on Levoxyl

## 2024-02-15 NOTE — Patient Instructions (Signed)
Debrox Ear Wax Removal Kit

## 2024-02-15 NOTE — Assessment & Plan Note (Signed)
 On Vit D

## 2024-02-15 NOTE — Telephone Encounter (Signed)
 Spoke with patient as she has additional needs from [employer]. Told would review concerns with Dr. Garald and get back to her via Mychart tomorrow.

## 2024-02-15 NOTE — Assessment & Plan Note (Signed)
 Not working now

## 2024-02-15 NOTE — Assessment & Plan Note (Signed)
 Per Dr Elmira: At this time, patient wants to monitor for next few months, now that she is retired. If she has any further recurrence, I would strongly recommend loop recorder placement.

## 2024-02-16 NOTE — Telephone Encounter (Signed)
 Attached MyChart message with updated letter as stated yesterday and faxed to [employer].

## 2024-02-23 NOTE — Telephone Encounter (Signed)
 Matter has been addressed in separate telephone note

## 2024-03-03 ENCOUNTER — Other Ambulatory Visit: Payer: Self-pay | Admitting: Internal Medicine

## 2024-03-03 DIAGNOSIS — G4733 Obstructive sleep apnea (adult) (pediatric): Secondary | ICD-10-CM | POA: Diagnosis not present

## 2024-03-10 ENCOUNTER — Ambulatory Visit: Payer: Medicare (Managed Care) | Admitting: Internal Medicine

## 2024-03-10 ENCOUNTER — Encounter: Payer: Self-pay | Admitting: Internal Medicine

## 2024-03-10 VITALS — BP 138/86 | HR 64 | Temp 98.0°F | Ht 66.0 in | Wt 301.4 lb

## 2024-03-10 DIAGNOSIS — Z6841 Body Mass Index (BMI) 40.0 and over, adult: Secondary | ICD-10-CM | POA: Diagnosis not present

## 2024-03-10 DIAGNOSIS — R55 Syncope and collapse: Secondary | ICD-10-CM

## 2024-03-10 DIAGNOSIS — F3341 Major depressive disorder, recurrent, in partial remission: Secondary | ICD-10-CM

## 2024-03-10 DIAGNOSIS — E559 Vitamin D deficiency, unspecified: Secondary | ICD-10-CM

## 2024-03-10 DIAGNOSIS — K219 Gastro-esophageal reflux disease without esophagitis: Secondary | ICD-10-CM

## 2024-03-10 NOTE — Assessment & Plan Note (Signed)
 Better

## 2024-03-10 NOTE — Assessment & Plan Note (Addendum)
 No relapse!  It appears that her syncope was triggered by stress.  Doing well now.  No relapse

## 2024-03-10 NOTE — Patient Instructions (Signed)
 Pensions consultant

## 2024-03-10 NOTE — Progress Notes (Signed)
 Subjective:  Patient ID: Tracy Sampson, female    DOB: 1956-04-01  Age: 68 y.o. MRN: [id number]  CC: Medicare Wellness (Welcome To Medicare)   HPI Tracy Sampson presents for HTN, syncope - no relapse, GERD, vitamin D  deficiency     Outpatient Medications Prior to Visit  Medication Sig Dispense Refill   amLODipine  (NORVASC ) 5 MG tablet TAKE 1 TABLET BY MOUTH EVERY DAY 90 tablet 3   cholecalciferol (VITAMIN D3) 25 MCG (1000 UNIT) tablet Take 1,000 Units by mouth daily.     cyclobenzaprine  (FLEXERIL ) 5 MG tablet Take 1 tablet (5 mg total) by mouth 3 (three) times daily as needed for muscle spasms. 30 tablet 0   diphenoxylate -atropine  (LOMOTIL ) 2.5-0.025 MG tablet Take 1-2 tablets by mouth 4 (four) times daily as needed for diarrhea or loose stools. 60 tablet 1   dorzolamide-timolol (COSOPT) 22.3-6.8 MG/ML ophthalmic solution Place 1 drop into both eyes 2 (two) times daily.      doxycycline  (VIBRA -TABS) 100 MG tablet Take 1 tablet (100 mg total) by mouth 2 (two) times daily. (Patient not taking: Reported on 02/15/2024) 14 tablet 3   famotidine  (PEPCID ) 40 MG tablet Take 1 tablet (40 mg total) by mouth daily. 90 tablet 2   latanoprost (XALATAN) 0.005 % ophthalmic solution Apply to eye.     levothyroxine  (SYNTHROID ) 150 MCG tablet TAKE 1 TABLET BY MOUTH DAILY BEFORE BREAKFAST. 90 tablet 3   meloxicam  (MOBIC ) 7.5 MG tablet TAKE 1 TABLET BY MOUTH EVERY DAY (Patient taking differently: Take 7.5 mg by mouth daily as needed.) 30 tablet 0   potassium chloride  (KLOR-CON ) 10 MEQ tablet TAKE 1 TABLET BY MOUTH EVERY DAY 90 tablet 3   rosuvastatin  (CRESTOR ) 20 MG tablet TAKE 1 TABLET BY MOUTH EVERY DAY 90 tablet 3   sertraline  (ZOLOFT ) 100 MG tablet TAKE 1 AND 1/2 TABLETS DAILY BY MOUTH 135 tablet 3   tolterodine  (DETROL  LA) 4 MG 24 hr capsule Take 1 capsule (4 mg total) by mouth daily. (Patient taking differently: Take 4 mg by mouth daily as needed.) 60 capsule 11   vitamin B-12 (CYANOCOBALAMIN )  500 MCG tablet Take 500 mcg by mouth daily.     No facility-administered medications prior to visit.    ROS: Review of Systems  Constitutional:  Negative for activity change, appetite change, chills, fatigue and unexpected weight change.  HENT:  Negative for congestion, mouth sores and sinus pressure.   Eyes:  Negative for visual disturbance.  Respiratory:  Negative for cough and chest tightness.   Gastrointestinal:  Negative for abdominal pain and nausea.  Genitourinary:  Negative for difficulty urinating, frequency and vaginal pain.  Musculoskeletal:  Positive for arthralgias and gait problem. Negative for back pain.  Skin:  Negative for pallor and rash.  Neurological:  Negative for dizziness, tremors, weakness, numbness and headaches.  Psychiatric/Behavioral:  Negative for confusion and sleep disturbance. The patient is not nervous/anxious.     Objective:  BP 138/86   Pulse 64   Temp 98 F (36.7 C)   Ht 5' 6 (1.676 m)   Wt (!) 301 lb 6.4 oz (136.7 kg)   LMP  (LMP Unknown)   SpO2 95%   BMI 48.65 kg/m   BP Readings from Last 3 Encounters:  03/10/24 138/86  02/15/24 130/82  12/27/23 136/82    Wt Readings from Last 3 Encounters:  03/10/24 (!) 301 lb 6.4 oz (136.7 kg)  02/15/24 (!) 303 lb (137.4 kg)  12/27/23 (!) 307 lb (139.3  kg)    Physical Exam Constitutional:      General: She is not in acute distress.    Appearance: She is well-developed. She is obese.  HENT:     Head: Normocephalic.     Right Ear: External ear normal.     Left Ear: External ear normal.     Nose: Nose normal.  Eyes:     General:        Right eye: No discharge.        Left eye: No discharge.     Conjunctiva/sclera: Conjunctivae normal.     Pupils: Pupils are equal, round, and reactive to light.  Neck:     Thyroid : No thyromegaly.     Vascular: No JVD.     Trachea: No tracheal deviation.  Cardiovascular:     Rate and Rhythm: Normal rate and regular rhythm.     Heart sounds: Normal  heart sounds.  Pulmonary:     Effort: No respiratory distress.     Breath sounds: No stridor. No wheezing.  Abdominal:     General: Bowel sounds are normal. There is no distension.     Palpations: Abdomen is soft. There is no mass.     Tenderness: There is no abdominal tenderness. There is no guarding or rebound.  Musculoskeletal:        General: No tenderness.     Cervical back: Normal range of motion and neck supple. No rigidity.     Right lower leg: No edema.     Left lower leg: No edema.  Lymphadenopathy:     Cervical: No cervical adenopathy.  Skin:    Findings: No erythema or rash.  Neurological:     Mental Status: She is oriented to person, place, and time.     Cranial Nerves: No cranial nerve deficit.     Motor: No abnormal muscle tone.     Coordination: Coordination normal.     Deep Tendon Reflexes: Reflexes normal.  Psychiatric:        Behavior: Behavior normal.        Thought Content: Thought content normal.        Judgment: Judgment normal.     Lab Results  Component Value Date   WBC 6.0 11/30/2023   HGB 12.6 11/30/2023   HCT 39.9 11/30/2023   PLT 233.0 11/30/2023   GLUCOSE 116 (H) 11/30/2023   CHOL 223 (H) 12/27/2023   TRIG 119 12/27/2023   HDL 51 12/27/2023   LDLDIRECT 149.3 10/23/2010   LDLCALC 151 (H) 12/27/2023   ALT 19 11/30/2023   AST 21 11/30/2023   NA 144 11/30/2023   K 4.4 11/30/2023   CL 107 11/30/2023   CREATININE 0.89 11/30/2023   BUN 14 11/30/2023   CO2 30 11/30/2023   TSH 0.53 11/30/2023   INR 1.1 (H) 09/05/2019   HGBA1C 5.7 09/23/2022    DG BONE DENSITY (DXA) Result Date: 07/19/2023 EXAM: DUAL X-RAY ABSORPTIOMETRY (DXA) FOR BONE MINERAL DENSITY IMPRESSION: Referring Physician:  ALMARIE MARLA Sampson Your patient completed a bone mineral density test using GE Lunar iDXA system (analysis version: 16). Technologist: ALW PATIENT: Name: Tracy Sampson, Tracy Sampson Patient ID: [id number] Birth Date: Jan 18, 1956 Height: 65.0 in. Sex: Female Measured:  07/19/2023 Weight: 307.0 lbs. Indications: Bilateral Ovariectomy, Estrogen Deficiency, Hypothyroid, Hysterectomy, Levothyroxine , Post Menopausal Fractures: Treatments: Calcium , Vitamin D  ASSESSMENT: The BMD measured at DualFemur Neck Left is 0.936 g/cm2 with a T-score of -0.7. This patient is considered normal according to World Health Organization [facility]) criteria.  Scan quality is limited due to body habitus. Exclusions: L-1 was excluded due to degenerative changes. Site Region Measured Date Measured Age YA BMD Significant CHANGE T-score DualFemur Neck Left  6/19/2023    68.5         -0.7    0.936 g/cm2 AP Spine  L2-L4      07/19/2023    68.5         1.7     1.431 g/cm2 DualFemur Total Mean 07/19/2023    68.5         -0.5    0.948 g/cm2 World Health Organization [facility]) criteria for post-menopausal, Caucasian Women: Normal       T-score at or above -1 SD Osteopenia   T-score between -1 and -2.5 SD Osteoporosis T-score at or below -2.5 SD RECOMMENDATION: 1. All patients should optimize calcium  and vitamin D  intake. 2. Consider FDA approved medical therapies in postmenopausal women and men aged 62 years and older, based on the following: a. A hip or vertebral (clinical or morphometric) fracture b. T-score = -2.5 at the femoral neck or spine after appropriate evaluation to exclude secondary causes c. Low bone mass (T-score between -1.0 and -2.5 at the femoral neck or spine) and a 10- year probability of a hip fracture = 3% or a 10 year probability of a major osteoporosis-related fracture = 20% based on the [place] -adapted WHO algorithm. 3. Clinician judgement and/or patient preference may indicate treatment for people with10-year fracture probabilities above or below these levels. FOLLOW-UP: Patients with diagnosis of osteoporosis or at high risk for fracture should have regular bone mineral density tests. For patients eligible for Medicare routine testing is allowed once every 2 years. The testing frequency can be increased  to one year for patients who have rapidly progressing disease, those who are receiving or discontinuing medical therapy to restore bone mass, or have additional risk factors. I have reviewed this study and agree with the findings. [facility] Electronically Signed   By: Dina  Arceo M.D.   On: 07/19/2023 08:55    Assessment & Plan:   Problem List Items Addressed This Visit     Depression - Primary   No change On Zoloft       OBESITY, MORBID   Better       Syncope and collapse   No relapse!  It appears that her syncope was triggered by stress.  Doing well now.  No relapse      Vitamin D  deficiency   On Vit D         No orders of the defined types were placed in this encounter.     Follow-up: Return in about 4 months (around 07/11/2024) for a follow-up visit.  Marolyn Noel, MD

## 2024-03-10 NOTE — Assessment & Plan Note (Signed)
 On Vit D

## 2024-03-10 NOTE — Assessment & Plan Note (Signed)
 No change On Zoloft 

## 2024-03-13 NOTE — Assessment & Plan Note (Signed)
 On Pepcid

## 2024-03-15 ENCOUNTER — Ambulatory Visit (INDEPENDENT_AMBULATORY_CARE_PROVIDER_SITE_OTHER): Payer: Medicare (Managed Care) | Admitting: Physician Assistant

## 2024-03-15 ENCOUNTER — Encounter (INDEPENDENT_AMBULATORY_CARE_PROVIDER_SITE_OTHER): Payer: Self-pay | Admitting: Physician Assistant

## 2024-03-15 ENCOUNTER — Institutional Professional Consult (permissible substitution) (INDEPENDENT_AMBULATORY_CARE_PROVIDER_SITE_OTHER): Payer: Medicare (Managed Care) | Admitting: Otolaryngology

## 2024-03-15 VITALS — BP 176/97 | HR 86 | Ht 66.0 in | Wt 301.0 lb

## 2024-03-15 DIAGNOSIS — H9192 Unspecified hearing loss, left ear: Secondary | ICD-10-CM

## 2024-03-15 NOTE — Progress Notes (Signed)
 Dear Dr. Garald, Here is my assessment for our mutual patient, Tracy Sampson. Thank you for allowing me the opportunity to care for your patient. Please do not hesitate to contact me should you have any other questions. Sincerely, Chyrl Cohen PA-C  Otolaryngology Clinic Note Referring provider: Dr. Garald HPI:  Tracy Sampson is a 68 y.o. female kindly referred by Dr. Garald   Discussed the use of AI scribe software for clinical note transcription with the patient, who gave verbal consent to proceed.  The patient is a 68 year old female seen in our office for evaluation of decreased hearing.  The patient notes over the last 2 months she has noted decreased hearing out of the left ear.  She denies any pain, no clicking or popping, she denies any dizziness, ringing.  No history recurrent ear infections, no drainage from the ear, no trauma to the ear, no head or neck surgeries.  No family history of hearing loss.      Independent Review of Additional Tests or Records:  None   PMH/Meds/All/SocHx/FamHx/ROS:   Past Medical History:  Diagnosis Date   Allergic rhinitis    Allergy    Arthritis    Cataract    bilateral removed   Depression    per pt not aware of this   GERD (gastroesophageal reflux disease)    Glaucoma    Hypertension    Hypothyroidism    Morbid obesity (HCC)    OSA (obstructive sleep apnea)    Dr Brien   Sleep apnea    wears cpap   Thyroid  disease    Urinary incontinence      Past Surgical History:  Procedure Laterality Date   abdominal supracervical hysterectomy with bilateral salpingo-oophorectomy     fibroid uterus   ANKLE SURGERY  4/10   rt   BREAST REDUCTION SURGERY     CATARACT EXTRACTION     COLONOSCOPY     COLONOSCOPY WITH PROPOFOL  N/A 12/19/2019   Procedure: COLONOSCOPY WITH PROPOFOL ;  Surgeon: Shila Gustav GAILS, MD;  Location: [facility];  Service: Endoscopy;  Laterality: N/A;   HERNIA REPAIR  2017   umbillical   HYSTEROSCOPY  N/A 07/25/2019   Procedure: HYSTEROSCOPY OF CERVIX WITH ULTRASOUND GUIDANCE/KS;  Surgeon: Jannis Kate Norris, MD;  Location: [facility] OR;  Service: Gynecology;  Laterality: N/A;   INSERTION OF MESH N/A 01/07/2016   Procedure: INSERTION OF MESH;  Surgeon: Vicenta Poli, MD;  Location: [facility];  Service: General;  Laterality: N/A;   OPERATIVE ULTRASOUND N/A 07/25/2019   Procedure: OPERATIVE ULTRASOUND;  Surgeon: Jannis Kate Norris, MD;  Location: [facility] OR;  Service: Gynecology;  Laterality: N/A;   POLYPECTOMY  12/19/2019   Procedure: POLYPECTOMY;  Surgeon: Shila Gustav GAILS, MD;  Location: [facility];  Service: Endoscopy;;   REDUCTION MAMMAPLASTY     VENTRAL HERNIA REPAIR N/A 01/07/2016   Procedure: VENTRAL HERNIA REPAIR;  Surgeon: Vicenta Poli, MD;  Location: [facility];  Service: General;  Laterality: N/A;    Family History  Problem Relation Age of Onset   Breast cancer Mother    Cancer Mother        breast   Hypertension Mother    Colon cancer Maternal Grandmother    Cancer Maternal Grandmother        colon     Social Connections: Moderately Isolated (03/10/2024)   Social Connection and Isolation Panel    Frequency of Communication with Friends and Family: Twice a week    Frequency of Social Gatherings with Friends  and Family: Never    Attends Religious Services: More than 4 times per year    Active Member of Clubs or Organizations: Yes    Attends Banker Meetings: 1 to 4 times per year    Marital Status: Never married      Current Outpatient Medications:    amLODipine  (NORVASC ) 5 MG tablet, TAKE 1 TABLET BY MOUTH EVERY DAY, Disp: 90 tablet, Rfl: 3   cholecalciferol (VITAMIN D3) 25 MCG (1000 UNIT) tablet, Take 1,000 Units by mouth daily., Disp: , Rfl:    cyclobenzaprine  (FLEXERIL ) 5 MG tablet, Take 1 tablet (5 mg total) by mouth 3 (three) times daily as needed for muscle spasms., Disp: 30 tablet, Rfl: 0   diphenoxylate -atropine  (LOMOTIL ) 2.5-0.025 MG tablet, Take 1-2  tablets by mouth 4 (four) times daily as needed for diarrhea or loose stools., Disp: 60 tablet, Rfl: 1   dorzolamide-timolol (COSOPT) 22.3-6.8 MG/ML ophthalmic solution, Place 1 drop into both eyes 2 (two) times daily. , Disp: , Rfl:    famotidine  (PEPCID ) 40 MG tablet, Take 1 tablet (40 mg total) by mouth daily., Disp: 90 tablet, Rfl: 2   latanoprost (XALATAN) 0.005 % ophthalmic solution, Apply to eye., Disp: , Rfl:    levothyroxine  (SYNTHROID ) 150 MCG tablet, TAKE 1 TABLET BY MOUTH DAILY BEFORE BREAKFAST., Disp: 90 tablet, Rfl: 3   meloxicam  (MOBIC ) 7.5 MG tablet, TAKE 1 TABLET BY MOUTH EVERY DAY (Patient taking differently: Take 7.5 mg by mouth daily as needed.), Disp: 30 tablet, Rfl: 0   potassium chloride  (KLOR-CON ) 10 MEQ tablet, TAKE 1 TABLET BY MOUTH EVERY DAY, Disp: 90 tablet, Rfl: 3   rosuvastatin  (CRESTOR ) 20 MG tablet, TAKE 1 TABLET BY MOUTH EVERY DAY, Disp: 90 tablet, Rfl: 3   sertraline  (ZOLOFT ) 100 MG tablet, TAKE 1 AND 1/2 TABLETS DAILY BY MOUTH, Disp: 135 tablet, Rfl: 3   tolterodine  (DETROL  LA) 4 MG 24 hr capsule, Take 1 capsule (4 mg total) by mouth daily. (Patient taking differently: Take 4 mg by mouth daily as needed.), Disp: 60 capsule, Rfl: 11   vitamin B-12 (CYANOCOBALAMIN ) 500 MCG tablet, Take 500 mcg by mouth daily., Disp: , Rfl:    doxycycline  (VIBRA -TABS) 100 MG tablet, Take 1 tablet (100 mg total) by mouth 2 (two) times daily. (Patient not taking: Reported on 03/15/2024), Disp: 14 tablet, Rfl: 3   Physical Exam:   BP (!) 176/97 (BP Location: Right Wrist, Patient Position: Sitting, Cuff Size: Large)   Pulse 86   Ht 5' 6 (1.676 m)   Wt (!) 301 lb (136.5 kg)   LMP  (LMP Unknown)   SpO2 93%   BMI 48.58 kg/m   Pertinent Findings  CN II-XII grossly intact Bilateral EAC clear and TM intact with well pneumatized middle ear spaces Weber 512: equal Rinne 512: AC > BC b/l  Anterior rhinoscopy: Septum midline; bilateral inferior turbinates with no hypertrophy No lesions  of oral cavity/oropharynx; dentition within normal limit No obviously palpable neck masses/lymphadenopathy/thyromegaly No respiratory distress or stridor  Physical Exam     Seprately Identifiable Procedures:  None  Impression & Plans:  Tracy Sampson is a 68 y.o. female with the following   Assessment and Plan  Left-sided hearing loss-   66-month history of left-sided hearing loss.  Unclear etiology.  Physical exam reassuring.  Patient will follow-up with audiological evaluation and I will call her with the results.  She was given strict return precautions.  She verbalized understanding and agreement to today's plan had  no further questions or concerns.     - f/u phone call discussion with audiological results   Thank you for allowing me the opportunity to care for your patient. Please do not hesitate to contact me should you have any other questions.  Sincerely, Chyrl Cohen PA-C [facility] Phone: [phone] Fax: [phone]  03/15/2024, 1:42 PM

## 2024-03-20 ENCOUNTER — Encounter: Payer: Self-pay | Admitting: Radiology

## 2024-03-22 ENCOUNTER — Other Ambulatory Visit (HOSPITAL_BASED_OUTPATIENT_CLINIC_OR_DEPARTMENT_OTHER): Payer: Self-pay

## 2024-03-22 ENCOUNTER — Telehealth: Payer: Self-pay

## 2024-03-22 ENCOUNTER — Telehealth (HOSPITAL_BASED_OUTPATIENT_CLINIC_OR_DEPARTMENT_OTHER): Payer: Self-pay

## 2024-03-22 DIAGNOSIS — G4733 Obstructive sleep apnea (adult) (pediatric): Secondary | ICD-10-CM

## 2024-03-22 NOTE — Telephone Encounter (Signed)
 Order placed    Copied from CRM [id number]. Topic: Clinical - Order For Equipment >> Mar 22, 2024 12:40 PM Russell PARAS wrote: Reason for CRM:   Pt is contacting clinic after speaking with [employer]. They are requesting a new prescription for her CPAP to provide the pt equipment.   Requested call back with status update once the order has been sent  CB#  [phone]

## 2024-03-22 NOTE — Telephone Encounter (Signed)
 Copied from CRM [id number]. Topic: General - Other >> Mar 22, 2024  2:06 PM Eva FALCON wrote: Reason for CRM: Pt was calling and states she spoke to Dr. Alexandra nurse in regards to getting a shower in her home, she currently has a bathtub but needs a shower with rails as she does not want to fall. She mentioned she called Medicaid who told her to speak to her PCP and he'll know what paperwork needs to be filled out and where to send it. I tried to get as much information as I could as to what paperwork is needed and where to send but that is all she told me. Please call back for further questions or concerns.

## 2024-03-24 ENCOUNTER — Telehealth (HOSPITAL_BASED_OUTPATIENT_CLINIC_OR_DEPARTMENT_OTHER): Payer: Self-pay

## 2024-03-24 NOTE — Telephone Encounter (Signed)
 Copied from CRM [id number]. Topic: Clinical - Order For Equipment >> Mar 24, 2024 11:29 AM Rilla NOVAK wrote: Reason for CRM: Patient calling to check on status of order for CPAP. Patient spoke with [employer] and they do not have the order.  Per Jamie's note, order was place.  Please call patient to update @ [phone].  Order awaiting ala signature

## 2024-03-27 NOTE — Telephone Encounter (Unsigned)
 Copied from CRM [id number]. Topic: General - Other >> Mar 27, 2024  2:19 PM Wess RAMAN wrote: Reason for CRM: Patient calling to check the order status for her shower.  Callback #: [phone]

## 2024-03-27 NOTE — Telephone Encounter (Signed)
 Dr Jude says he's signed all

## 2024-03-27 NOTE — Telephone Encounter (Signed)
 Thank you for update. Sent order to DME. Nothing further needed at this time.

## 2024-03-28 NOTE — Telephone Encounter (Signed)
 We do not have any forms.  You can type her a letter that she needs a shower rail and a walk-in shower due to medical reasons.  I will sign it.  If there is a special form, she needs to get it and give it to us . Thanks

## 2024-03-30 ENCOUNTER — Telehealth: Payer: Self-pay

## 2024-03-30 NOTE — Telephone Encounter (Signed)
 Copied from CRM [id number]. Topic: Clinical - Order For Equipment >> Mar 24, 2024 11:29 AM Rilla NOVAK wrote: Reason for CRM: Patient calling to check on status of order for CPAP. Patient spoke with [employer] and they do not have the order.  Per Jamie's note, order was place.  Please call patient to update @ [phone]. >> Mar 30, 2024  1:44 PM Mercedes MATSU wrote: Patient called in stating that her insurance company said that all the doctor has to do is write her a note and she would be able to get the tub railing. Patient is requesting an urgent call back and can be reached at [phone]. She states that when she spoke with her insurance company, she said they said there is a form that he needs to fill out and that he has the information. Patient is very upset and states she does not understand why nobody is returning her call. She said she has also been trying to get a request for her Cpap machine, she said she got a email stating that she is qualified for the machine. Patient states that she has a lot of questions and nobody is reaching out to her.

## 2024-03-31 NOTE — Telephone Encounter (Signed)
 Attempted to call patient but had to leave a voice message. Wanted to see if she had a fax number that we could provide a letter to. I also wanted to inform her about us  not having any forms or documents on sight for this sort of things.    If patient is to call back please also offer her the contact information for the Aging Gracefully program of the [employer] [place].    Phone: [phone]

## 2024-03-31 NOTE — Telephone Encounter (Signed)
 Copied from CRM #[id number]. Topic: General - Other >> Mar 31, 2024  2:59 PM Victoria A wrote: Reason for CRM: Patient called back to provide her Insurance [phone] request for shower chair,grab bars, hand rails stating that patient needs those items

## 2024-03-31 NOTE — Telephone Encounter (Signed)
 Patient returned call and has been made aware of chart notations. Patient will back with fax number for letter to be faxed to for tub railing.

## 2024-04-04 ENCOUNTER — Encounter: Payer: Self-pay | Admitting: Internal Medicine

## 2024-04-04 NOTE — Telephone Encounter (Signed)
 The letter has been printed for PCP to review and sign.  **We are needing a fax number to send this LMN letter for pts bathing modifications.

## 2024-04-05 DIAGNOSIS — G4733 Obstructive sleep apnea (adult) (pediatric): Secondary | ICD-10-CM | POA: Diagnosis not present

## 2024-04-07 ENCOUNTER — Other Ambulatory Visit: Payer: Self-pay

## 2024-04-07 ENCOUNTER — Telehealth: Payer: Self-pay | Admitting: Internal Medicine

## 2024-04-07 MED ORDER — LEVOTHYROXINE SODIUM 150 MCG PO TABS: 150.0000 ug | ORAL_TABLET | Freq: Every day | ORAL | 3 refills | Status: AC

## 2024-04-07 NOTE — Telephone Encounter (Signed)
 Refill sent in

## 2024-04-07 NOTE — Telephone Encounter (Unsigned)
 Copied from CRM [id number]. Topic: Clinical - Medication Refill >> Apr 07, 2024  9:38 AM China J wrote: Medication:  levothyroxine  (SYNTHROID ) 150 MCG tablet  Has the patient contacted their pharmacy? Yes (Agent: If no, request that the patient contact the pharmacy for the refill. If patient does not wish to contact the pharmacy document the reason why and proceed with request.) (Agent: If yes, when and what did the pharmacy advise?)  This is the patient's preferred pharmacy:  [id number] - [place], [place] - [address] AT [facility] [address] [place] [address] Phone: [phone] Fax: [phone]  Is this the correct pharmacy for this prescription? Yes If no, delete pharmacy and type the correct one.   Has the prescription been filled recently? No  Is the patient out of the medication? No  Has the patient been seen for an appointment in the last year OR does the patient have an upcoming appointment? Yes  Can we respond through MyChart? Yes  Agent: Please be advised that Rx refills may take up to 3 business days. We ask that you follow-up with your pharmacy.

## 2024-04-21 ENCOUNTER — Other Ambulatory Visit: Payer: Self-pay | Admitting: Internal Medicine

## 2024-04-21 ENCOUNTER — Ambulatory Visit (INDEPENDENT_AMBULATORY_CARE_PROVIDER_SITE_OTHER): Payer: Medicare (Managed Care) | Admitting: Audiology

## 2024-04-21 NOTE — Telephone Encounter (Signed)
 Copied from CRM [id number]. Topic: Clinical - Medication Refill >> Apr 21, 2024 12:10 PM [name] wrote: Medication: latanoprost (XALATAN) 0.005 % ophthalmic solution  Has the patient contacted their pharmacy? Yes, they asked patient to contact the office.  (Agent: If no, request that the patient contact the pharmacy for the refill. If patient does not wish to contact the pharmacy document the reason why and proceed with request.) (Agent: If yes, when and what did the pharmacy advise?)  This is the patient's preferred pharmacy:  [id number] - [place], [place] - [address] AT [facility] [address] [place] [address] Phone: [phone] Fax: [phone]  Is this the correct pharmacy for this prescription? Yes If no, delete pharmacy and type the correct one.   Has the prescription been filled recently? No  Is the patient out of the medication? No, she has one bottle left that will last the weekend.   Has the patient been seen for an appointment in the last year OR does the patient have an upcoming appointment? Yes  Can we respond through MyChart? No  Agent: Please be advised that Rx refills may take up to 3 business days. We ask that you follow-up with your pharmacy.

## 2024-05-09 ENCOUNTER — Inpatient Hospital Stay (HOSPITAL_BASED_OUTPATIENT_CLINIC_OR_DEPARTMENT_OTHER): Admission: RE | Admit: 2024-05-09 | Payer: Medicare (Managed Care) | Source: Ambulatory Visit

## 2024-05-13 ENCOUNTER — Inpatient Hospital Stay (HOSPITAL_BASED_OUTPATIENT_CLINIC_OR_DEPARTMENT_OTHER): Admission: RE | Admit: 2024-05-13 | Payer: Medicare (Managed Care) | Source: Ambulatory Visit

## 2024-05-15 ENCOUNTER — Ambulatory Visit (HOSPITAL_BASED_OUTPATIENT_CLINIC_OR_DEPARTMENT_OTHER)
Admission: RE | Admit: 2024-05-15 | Discharge: 2024-05-15 | Disposition: A | Discharge status: Home / Self Care | Payer: Medicare (Managed Care) | Source: Ambulatory Visit | Attending: Obstetrics and Gynecology | Admitting: Obstetrics and Gynecology

## 2024-05-15 ENCOUNTER — Encounter (HOSPITAL_BASED_OUTPATIENT_CLINIC_OR_DEPARTMENT_OTHER): Payer: Self-pay

## 2024-05-15 ENCOUNTER — Ambulatory Visit (HOSPITAL_BASED_OUTPATIENT_CLINIC_OR_DEPARTMENT_OTHER): Payer: Medicare (Managed Care)

## 2024-05-15 ENCOUNTER — Ambulatory Visit

## 2024-05-15 DIAGNOSIS — Z1231 Encounter for screening mammogram for malignant neoplasm of breast: Secondary | ICD-10-CM | POA: Diagnosis present

## 2024-05-15 DIAGNOSIS — Z01419 Encounter for gynecological examination (general) (routine) without abnormal findings: Secondary | ICD-10-CM | POA: Diagnosis present

## 2024-05-16 ENCOUNTER — Ambulatory Visit: Payer: Medicare (Managed Care) | Admitting: Internal Medicine

## 2024-05-17 ENCOUNTER — Ambulatory Visit: Payer: Self-pay | Admitting: Obstetrics and Gynecology

## 2024-06-05 ENCOUNTER — Ambulatory Visit (INDEPENDENT_AMBULATORY_CARE_PROVIDER_SITE_OTHER): Payer: Medicare (Managed Care) | Admitting: Audiology

## 2024-06-05 DIAGNOSIS — H903 Sensorineural hearing loss, bilateral: Secondary | ICD-10-CM

## 2024-06-05 NOTE — Progress Notes (Signed)
" °  [address] [place], [place] [address] [phone]  Audiological Evaluation    Name: Tracy Sampson     DOB:   1955/08/16      MRN:   [id number]                                                                                     Service Date: 06/05/2024     Accompanied by: self    Patient comes today after Reyes Cohen, PA-C sent a referral for a hearing evaluation due to concerns with left ear hearing loss.   Symptoms Yes Details  Hearing loss  [x]  Left sided hearing loss for months  Tinnitus  []    Ear pain/ infections/pressure  []    Balance problems  [x]  Reports to fall and loose conscience for 1-2 minutes- last time it happened was July 2025  Noise exposure history  []    Previous ear surgeries  []    Family history of hearing loss  []    Amplification  []    Other  []      Otoscopy: Right ear: Clear external ear canal and notable landmarks visualized on the tympanic membrane. Left ear:  Clear external ear canal and notable landmarks visualized on the tympanic membrane.  Tympanometry: Right ear: Type A - Normal external ear canal volume with normal middle ear pressure and normal tympanic membrane compliance. Findings are consistent with normal middle ear function. Left ear: Type A - Normal external ear canal volume with normal middle ear pressure and normal tympanic membrane compliance. Findings are consistent with normal middle ear function.   Hearing Evaluation The hearing test results were completed under headphones and re-checked with inserts and results are deemed to be of good to fair reliability. Test technique:  conventional    Pure tone Audiometry: Right ear- Normal to moderately severe sensorineural hearing loss from 250 Hz - 8000 Hz. Left ear-  Normal to moderately severe sensorineural hearing loss from 250 Hz - 8000 Hz.  Speech Audiometry: Right ear- Speech Reception Threshold (SRT) was obtained at 35 dBHL. Left ear-Speech Reception  Threshold (SRT) was obtained at 25 dBHL.   Word Recognition Score Tested using NU-6 (recorded) Right ear: 100% was obtained at a presentation level of 75 dBHL with contralateral masking which is deemed as  excellent. Left ear: 100% was obtained at a presentation level of 75 dBHL with contralateral masking which is deemed as  excellent.   Impression: There is a significant difference in pure-tone thresholds between ears, worse in the right ear from 1000-1500 Hz.   Recommendations: Follow up with ENT as scheduled. Return for a hearing evaluation if concerns with hearing changes arise or per MD recommendation. Consider a communication needs assessment for amplification after medical clearance is obtained, if needed.   Kiauna Zywicki MARIE LEROUX-MARTINEZ, AUD  "

## 2024-06-06 ENCOUNTER — Telehealth (INDEPENDENT_AMBULATORY_CARE_PROVIDER_SITE_OTHER): Payer: Self-pay | Admitting: Physician Assistant

## 2024-06-06 NOTE — Telephone Encounter (Signed)
 Audiological results reviewed, she does have minimal sensorineural asymmetry at 1000 Hz at approximately 10 dB and 1500 Hz at approximately 15 dB.  No indication for imaging at this time.  I would recommend repeat audiological evaluation in 1 year to assure no significant changes, sooner as needed.  She may benefit from hearing aids.  Strict return precautions given.  She verbalized understanding and agreement to today's plan.

## 2024-06-08 ENCOUNTER — Other Ambulatory Visit: Payer: Self-pay

## 2024-06-08 NOTE — Patient Outreach (Signed)
 Aging Gracefully Program  06/08/2024  MALENA TIMPONE [birth year] [id number]   [facility] Evaluation Interviewer attempted to call patient on today regarding Aging Gracefully referral. No answer from patient due to busy signal, unable to leave message for return call.  Will attempt to call back tomorrow.    Shereen [facility]  Population Health Care Management Assistant  Direct Dial: [phone]  Fax: [phone] Website: [email or web address]

## 2024-06-09 ENCOUNTER — Other Ambulatory Visit: Payer: Self-pay

## 2024-06-09 NOTE — Patient Outreach (Signed)
 Aging Gracefully Program  06/09/2024  Tracy Sampson [birth year] [id number]   [facility] Evaluation Interviewer made contact with patient. Aging Gracefully survey completed.   Interviewer will send referral to RN and OT for follow up.    Shereen [facility]  Population Health Care Management Assistant  Direct Dial: [phone]  Fax: [phone] Website: [email or web address]

## 2024-06-16 ENCOUNTER — Encounter: Payer: Medicare (Managed Care) | Admitting: Rehabilitation

## 2024-06-23 ENCOUNTER — Other Ambulatory Visit: Payer: Self-pay | Admitting: Rehabilitation

## 2024-06-23 NOTE — Patient Outreach (Signed)
 Aging Gracefully Program  OT Initial Visit  06/23/2024  Tracy Sampson Sep 20, 1955 [id number]  Visit:  1- Initial Visit  Start Time:  1230 End Time:  1400 Total Minutes:  90  CCAP: Typical Daily Routine: Typical Daily Routine:: wake up and assess pain level. Pain varies day to day. Sometimes stays upstairs all day. What Types Of Care Problems Are You Having Throughout The Day?: access and mobility in addition to pain What Kind Of Help Do You Receive?: none Do You Think You Need Other Types Of Help?: yes, cleaning the house What Do You Think Would Make Everyday Life Easier For You?: support, decreased pain What Is A Good Day Like?: less to no pain What Is A Bad Day Like?: unable to walk downstairs Do You Have Time For Yourself?: yes Patient Reported Equipment: Patient Reported Equipment Currently Used:  (none) Functional Mobility-Walking Indoors/Getting Around the House: Walking Indoors/Getting Around [employer]: Moderate Difficulty Do You:: No Device/No Assistance Importance Of Learning New Strategies:: Moderate Other Comments: : this varies, can be little pain and then able to walk better Observation: Walking Indoors/Getting Around The House: Independent With Pain, Difficulty, Or Use Of Device Safety: A Little Risk Efficiency: Somewhat Intervention: Yes Other Comments:: recommend PT due to falls screen Functional Mobility-Walk A Block: Walk A Block: Moderate Difficulty Do You:: No Device/No Assistance Importance Of Learning New Strategies:: Not At All Other Comments:: avoids Functional Mobility-Maintain Balance While Showering: Maintaining Balance While Showering: A Lot Of Difficulty Do You:: No Device/No Assistance Importance Of Learning New Strategies:: Very Much Observation: Maintain Balance While Showering: N/O Safety: Extreme Risk Efficiency: Not At All Intervention: Yes Functional Mobility-Stooping, Crouching, Kneeling To Retreive Item: Stooping, Crouching, or  Kneeling To Retrieve Item: Moderate Difficulty Do You:: No Device/No Assistance Importance Of Learning New Strategies:: A Little Observation: Stoop, Crouch, or Kneel: N/O Safety: A Little Risk Efficiency: Somewhat Intervention: Yes Other Comments:: braces self on an object like the bed/wall, door frame Functional Mobility-Bending From Standing Position To Pick Up Clothing Off The Floor: Bending Over From Standing Position To Pick Up Clothing Off The Floor: Moderate Difficulty Do You:: No Device/No Assistance Importance Of Learning New Strategies:: A Little Observation: Bending Over From Standing Postion To Pick Up Clothing Off Of Floor: N/O Safety: A Little Risk Efficiency: Somewhat Intervention: Yes Other Comments:: braces self on objects Functional Mobility-Reaching For Items Above Shoulder Level: Reaching For Items Above Shoulder Level: Moderate Difficulty Do You:: No Device/No Assistance Importance Of Learning New Strategies:: Moderate Observation: Reaching For Items Above Shoulder Level: Independent With Pain, Difficulty, Or Use Of Device Safety: A Little Risk Efficiency: Somewhat Intervention: Yes Other Comments:: recommend lowering shelf in laundry to improve ability to reach and trial using a reacher Functional Mobility-Climb 1 Flight Of Stairs: Climb 1 Flight Of Stairs: Moderate Difficulty Do You:: No Device/No Assistance Importance Of Learning New Strategies:: Very Much Other Comments:: slow pace, uses handrails Observation: Climb One Flight Of Stairs: Independent With Pain, Difficulty, Or Use Of Device Safety: Moderate/Extreme Risk Efficiency: Somewhat Intervention: Yes Functional Mobility-Move In And Out Of Chair: Move In and Out Of A Chair: A Little Difficulty Do You:: No Device/No Assistance Importance Of Learning New Strategies:: A Little Other Comments:: avoids low chair, uses handrail on chair Observation: Move In And Out Of Chair: Independent With Pain,  Difficulty, Or Use Of Device Safety: A Little Risk Efficiency: Somewhat Intervention: No Functional Mobility-Move In And Out Of Bed: Move In and Out Of Bed: No Difficulty Do You::  No Device/No Assistance Importance Of Learning New Strategies:: Not At All Functional Mobility-Move In And Out Of Bath/Shower: Move In And Out Of A Bath/Shower: A Lot Of Difficulty Do You:: No Device/No Assistance Importance Of Learning New Strategies:: Very Much Observation: Move In And Out Of Bath/Shower: N/O Safety: Extreme Risk Efficiency: Not At All Intervention: Yes Functional Mobility-Get On And Off Toilet: Getting Up From The Floor: A Lot Of Difficulty Do You:: No Device/No Assistance Importance Of Learning New Strategies:: Very Much Observation: Getting Up From The Floor: N/O Safety: Moderate/Extreme Risk Efficiency: Not At All Intervention: Yes Other Comments:: resources, recommend PT for strengthening Functional Mobility-Into And Out Of Car, Not Including Driving: Into  And Out Of Car, Not Including Driving: A Little Difficulty Do You:: No Device/No Assistance Importance Of Learning New Strategies:: Not At All Functional Mobility-Other Mobility Difficulty:      Activities of Daily Living-Bathing/Showering: ADL-Bathing/Showering: A Lot of Difficulty Do You:: No Device/No Assistance Importance Of Learning New Strategies: Very Much ADL Observation: Bathing/Showering: N/O Safety: Moderate/Extreme Risk Efficiency: Somewhat Intervention: Yes Other Comments:: reports feeling unsafe in standing. No grab bars Activities of Daily Living-Personal Hygiene and Grooming: Personal Hygiene and Grooming: No Difficulty Do You:: N/A-Not Applicable Importance Of Learning New Strategies: Not At All Activities of Daily Living-Toilet Hygiene: Toilet Hygiene: No Difficulty Do You:: No Device/No Assistance Importance Of Learning New Strategies: Not At All Activities of Daily Living-Put On And Take Off  Undergarments (Incl. Fasteners): Put On And Take Off Undergarments (Incl. Fasteners): No Difficulty Do You:: No Device/No Assistance Importance Of Learning New Strategies: Not At All Activities of Daily Living-Put On And Take Off Shirt/Dress/Coat (Incl. Fasteners): Put On And Take Off Shirt/Dress/Coat (Incl. Fasteners): No Difficulty Do You:: No Device/No Assistance Importance Of Learning New Strategies: Not At All Activities of Daily Living-Put On And Take Off Socks And Shoes: Put On And Take Off Socks And  Shoes: No Difficulty Do You:: No Device/No Assistance Importance Of Learning New Strategies: Not At All Activities of Daily Living-Feed Self: Feed Self: No Difficulty Do You:: No Device/No Assistance Importance Of Learning New Strategies: Not At All Activities of Daily Living-Rest And Sleep: Rest and Sleep: A Little Difficulty Do You:: No Device/No Assistance Importance Of Learning New Strategies: Not At All Activities of Daily Living-Sexual Activity: Sexual  Activity: N/A Do You:: N/A-Not Applicable Importance Of Learning New Strategies: N/A Activities of Daily Living-Other Activity Identified:    Instrumental Activities of Daily Living-Light Homemaking (Laundry, Straightening Up, Vacuuming):  Do Light Homemaking (Laundry, Straightening Up, Vacuuming): A Lot of Difficulty Do You:: No Device/No Assistance Importance Of Learning New Strategies: Very Much Other Comments:: recognizes she needs house cleaning help Instrumental Activities of Daily Living-Making A Bed: Making a Bed: A Lot of Difficulty Do You:: No Device/No Assistance Importance Of Learning New Strategies: A Little Instrumental Activities of Daily Living-Washing Dishes By Hand While Standing At The Sink: Washing Dishes By Hand While Standing At The Sink: A Lot of Difficulty Do You:: No Device/No Assistance Importance Of Learning New Strategies: Moderate IADL Observation: Washing Dishes By Hand While Standing At  The Sink: Independent With Pain, Difficulty, Or Use Of Device Safety: A Little Risk Efficiency: Somewhat Intervention: Yes Other Comments:: already using energy conservation. Recommend PT to improve strength and endurance Instrumental Activities of Daily Living-Grocery Shopping: Do Grocery Shopping: A Little Difficulty Do You:: Use A Device Importance Of Learning New Strategies: A Little Other Comments:: uses grocery cart for balance Instrumental Activities of Daily  Living-Use Telephone: Use Telephone: No Difficulty Do You:: N/A-Not Applicable Importance Of Learning New Strategies: Not At All Instrumental Activities of Daily Living-Financial Management: Financial Management: No Difficulty Do You:: No Device/No Assistance Importance Of Learning New Strategies: Not At All Instrumental Activities of Daily Living-Medications: Take Medications: No Difficulty Do You:: No Device/No Assistance Importance Of Learning New Strategies: Not At All Instrumental Activities of Daily Living-Health Management And Maintenance: Health Management & Maintenance: No Difficulty Do You:: No Device/No Assistance Importance Of Learning New Strategies: Not At All Instrumental Activities of Daily Living-Meal Preparation and Clean-Up: Meal Preparation and Clean-Up: No Difficulty Do You:: No Device/No Assistance Importance Of Learning New Strategies: Not At All Instrumental Activities of Daily Living-Provide Care For Others/Pets: Care For Others/Pets: N/A Do You:: N/A-Not Applicable Importance Of Learning New Strategies: N/A Instrumental Activities of Daily Living-Take Part In Organized Social Activities: Take Part In Organized Social Activities: Moderate Difficulty Do You:: No Device/No Assistance Importance Of Learning New Strategies: A Little Other Comments:: would like to be more social Instrumental Activities of Daily Living-Leisure Participation: Leisure Participation: Moderate Difficulty Do You:: No  Device/No Assistance Importance Of Learning New Strategies: A Little Instrumental Activities of Daily Living-Employment/Volunteer Activities: Employment/Volunteer Activities: Moderate Difficulty Do You:: No Device/No Assistance Importance Of Learning New Strategies: A Little Other Comments:: retired due to health, used to Neurosurgeon Activities of Daily Living-Other Identifies:    Readiness To Change Score:  Readiness to Change Score: 8.33  Home Environment Assessment: Outside Home Entry:: zero entry into townhome Entryway/Foyer:: open and clear Dining Room:: rug flooring, supportive seating Living Room:: open, carpet flooring Kitchen:: open, linoleum flooring. Now uses paper plates and cups to reduce washing dishes and putting dishes away Stairs:: 14 straight to second floor. Initial handrail both sides then changes to only one side after step 6. Walking up handrail continues on left to top but stops short of support during final step. Bathroom:: 3 bathrooms. half bath on main level with 16 in height toilet, sink does not function and water is turned off. Upstairs hall bath is a actuary. Sink is turned off. Main bedroom bathrooom tub-shower, no grab bars, tall toilet Master Bedroom:: vinyl flooring throughout upstairs. Clear walk way Laundry:: in upstairs hallway. Difficulty to reach shelf over machine Hallways:: wide and clear. Smoke/CO2 Detector:: yes  Durable Medical Equipment:  none  Patient Education: Education Provided: Yes Education Details: AG consent form and patient rights reviewed, Check for Engineer, site, Copy of goals Person(s) Educated: Patient Comprehension: Verbalized Understanding  Goals:  Goals Addressed               This Visit's Progress     AG OT patient stated (pt-stated)        Safety and mobility in the bathrooms (toilet, shower, faucets, grab bars)      AG OT Patient Stated        Safe mobility and reaching with laundry and  closets/cabinets (pharmacist, hospital, lower shelf in laundry, use of reacher)      AG OT Patient Stated        Safety and mobility walking up and down stairs (need additional handrails)        Post Clinical Reasoning: Client Action (Goal) One Interventions: Safety and mobility in the bathrooms Client Action (Goal) Two Interventions: Safe mobility and reaching with laundry and closets Client Action (Goal) Three Interventions: Safety and mobility walking up and down stairs Clinician View Of Client Situation:: Client is aware of medical changes  in last year and adverse impact. She reports working closely with her doctor. Reports pain and variable pain. Recognizes that she used to be more involved in the community and more active and would like to be able to return to feeling better and stronger. Client View Of His/Her Situation:: Is aware and knowledgeable of her medical needs. Verbalizes variability with pain and recognizes the aversive impact of pain on her mobility. When asked, is not able to report local support. Client wants to improve and be safe in her home. Verbalizes being discouraged by difficulties cleaning.  Next Visit Plan:: issue a reacher and address kitchen cabinets, review how to get up from a fall, review goals and plan for upstairs shower

## 2024-07-11 ENCOUNTER — Ambulatory Visit: Payer: Medicare (Managed Care) | Admitting: Internal Medicine
